# Patient Record
Sex: Female | Born: 1950
Health system: Southern US, Community
[De-identification: ages and names within clinical notes are randomized; demographics above are authoritative.]

## PROBLEM LIST (undated history)

## (undated) DIAGNOSIS — M1990 Unspecified osteoarthritis, unspecified site: Secondary | ICD-10-CM

## (undated) DIAGNOSIS — T7840XA Allergy, unspecified, initial encounter: Secondary | ICD-10-CM

## (undated) DIAGNOSIS — E785 Hyperlipidemia, unspecified: Secondary | ICD-10-CM

## (undated) DIAGNOSIS — H269 Unspecified cataract: Secondary | ICD-10-CM

## (undated) DIAGNOSIS — R519 Headache, unspecified: Secondary | ICD-10-CM

## (undated) DIAGNOSIS — M810 Age-related osteoporosis without current pathological fracture: Secondary | ICD-10-CM

## (undated) DIAGNOSIS — R51 Headache: Secondary | ICD-10-CM

## (undated) DIAGNOSIS — K219 Gastro-esophageal reflux disease without esophagitis: Secondary | ICD-10-CM

## (undated) DIAGNOSIS — E079 Disorder of thyroid, unspecified: Secondary | ICD-10-CM

## (undated) HISTORY — DX: Disorder of thyroid, unspecified: E07.9

## (undated) HISTORY — DX: Unspecified osteoarthritis, unspecified site: M19.90

## (undated) HISTORY — DX: Headache, unspecified: R51.9

## (undated) HISTORY — DX: Hyperlipidemia, unspecified: E78.5

## (undated) HISTORY — DX: Headache: R51

## (undated) HISTORY — DX: Allergy, unspecified, initial encounter: T78.40XA

## (undated) HISTORY — DX: Age-related osteoporosis without current pathological fracture: M81.0

## (undated) HISTORY — PX: TONSILLECTOMY: SUR1361

## (undated) HISTORY — DX: Gastro-esophageal reflux disease without esophagitis: K21.9

## (undated) HISTORY — DX: Unspecified cataract: H26.9

---

## 2000-09-08 ENCOUNTER — Other Ambulatory Visit: Admission: RE | Admit: 2000-09-08 | Discharge: 2000-09-08 | Payer: Self-pay | Admitting: Family Medicine

## 2001-06-10 HISTORY — PX: BREAST BIOPSY: SHX20

## 2001-08-05 ENCOUNTER — Encounter: Admission: RE | Admit: 2001-08-05 | Discharge: 2001-11-03 | Payer: Self-pay | Admitting: Family Medicine

## 2001-09-11 ENCOUNTER — Other Ambulatory Visit: Admission: RE | Admit: 2001-09-11 | Discharge: 2001-09-11 | Payer: Self-pay | Admitting: Family Medicine

## 2001-10-26 ENCOUNTER — Encounter: Payer: Self-pay | Admitting: General Surgery

## 2001-10-26 ENCOUNTER — Encounter: Admission: RE | Admit: 2001-10-26 | Discharge: 2001-10-26 | Payer: Self-pay | Admitting: General Surgery

## 2002-05-05 ENCOUNTER — Encounter: Admission: RE | Admit: 2002-05-05 | Discharge: 2002-05-05 | Payer: Self-pay | Admitting: Family Medicine

## 2002-05-05 ENCOUNTER — Encounter: Payer: Self-pay | Admitting: Family Medicine

## 2002-05-07 ENCOUNTER — Encounter (INDEPENDENT_AMBULATORY_CARE_PROVIDER_SITE_OTHER): Payer: Self-pay

## 2002-05-07 ENCOUNTER — Encounter: Admission: RE | Admit: 2002-05-07 | Discharge: 2002-05-07 | Payer: Self-pay | Admitting: General Surgery

## 2002-05-07 ENCOUNTER — Encounter: Payer: Self-pay | Admitting: General Surgery

## 2002-12-14 ENCOUNTER — Other Ambulatory Visit: Admission: RE | Admit: 2002-12-14 | Discharge: 2002-12-14 | Payer: Self-pay | Admitting: Family Medicine

## 2005-12-30 ENCOUNTER — Ambulatory Visit: Payer: Self-pay | Admitting: Family Medicine

## 2006-01-30 ENCOUNTER — Other Ambulatory Visit: Admission: RE | Admit: 2006-01-30 | Discharge: 2006-01-30 | Payer: Self-pay | Admitting: Endocrinology

## 2006-01-31 ENCOUNTER — Encounter: Admission: RE | Admit: 2006-01-31 | Discharge: 2006-01-31 | Payer: Self-pay | Admitting: *Deleted

## 2008-07-28 ENCOUNTER — Other Ambulatory Visit: Admission: RE | Admit: 2008-07-28 | Discharge: 2008-07-28 | Payer: Self-pay | Admitting: Family Medicine

## 2009-01-04 ENCOUNTER — Encounter: Admission: RE | Admit: 2009-01-04 | Discharge: 2009-01-04 | Payer: Self-pay | Admitting: Family Medicine

## 2009-11-07 ENCOUNTER — Other Ambulatory Visit: Admission: RE | Admit: 2009-11-07 | Discharge: 2009-11-07 | Payer: Self-pay | Admitting: *Deleted

## 2010-03-26 ENCOUNTER — Encounter: Admission: RE | Admit: 2010-03-26 | Discharge: 2010-03-26 | Payer: Self-pay | Admitting: Family Medicine

## 2010-04-14 ENCOUNTER — Encounter: Admission: RE | Admit: 2010-04-14 | Discharge: 2010-04-14 | Payer: Self-pay | Admitting: Family Medicine

## 2010-06-30 ENCOUNTER — Encounter: Payer: Self-pay | Admitting: Family Medicine

## 2010-07-01 ENCOUNTER — Encounter: Payer: Self-pay | Admitting: Family Medicine

## 2010-07-02 ENCOUNTER — Encounter: Payer: Self-pay | Admitting: *Deleted

## 2010-11-14 ENCOUNTER — Other Ambulatory Visit: Payer: Self-pay | Admitting: Family Medicine

## 2010-11-14 ENCOUNTER — Other Ambulatory Visit: Payer: Self-pay

## 2010-11-14 ENCOUNTER — Other Ambulatory Visit (HOSPITAL_COMMUNITY)
Admission: RE | Admit: 2010-11-14 | Discharge: 2010-11-14 | Disposition: A | Discharge status: Home / Self Care | Payer: BC Managed Care – PPO | Source: Ambulatory Visit | Attending: Internal Medicine | Admitting: Internal Medicine

## 2010-11-14 DIAGNOSIS — Z780 Asymptomatic menopausal state: Secondary | ICD-10-CM

## 2010-11-14 DIAGNOSIS — M549 Dorsalgia, unspecified: Secondary | ICD-10-CM

## 2010-11-14 DIAGNOSIS — Z01419 Encounter for gynecological examination (general) (routine) without abnormal findings: Secondary | ICD-10-CM | POA: Insufficient documentation

## 2010-11-14 DIAGNOSIS — Z1231 Encounter for screening mammogram for malignant neoplasm of breast: Secondary | ICD-10-CM

## 2010-11-29 ENCOUNTER — Other Ambulatory Visit: Payer: Self-pay | Admitting: Family Medicine

## 2010-11-29 DIAGNOSIS — N644 Mastodynia: Secondary | ICD-10-CM

## 2010-12-14 ENCOUNTER — Ambulatory Visit: Payer: Self-pay

## 2010-12-14 ENCOUNTER — Other Ambulatory Visit: Payer: Self-pay | Admitting: Family Medicine

## 2010-12-14 ENCOUNTER — Ambulatory Visit
Admission: RE | Admit: 2010-12-14 | Discharge: 2010-12-14 | Disposition: A | Discharge status: Home / Self Care | Payer: BC Managed Care – PPO | Source: Ambulatory Visit | Attending: Family Medicine | Admitting: Family Medicine

## 2010-12-14 DIAGNOSIS — Z780 Asymptomatic menopausal state: Secondary | ICD-10-CM

## 2010-12-14 DIAGNOSIS — N644 Mastodynia: Secondary | ICD-10-CM

## 2010-12-14 DIAGNOSIS — M549 Dorsalgia, unspecified: Secondary | ICD-10-CM

## 2011-11-15 ENCOUNTER — Other Ambulatory Visit: Payer: Self-pay | Admitting: Family Medicine

## 2011-11-15 ENCOUNTER — Other Ambulatory Visit (HOSPITAL_COMMUNITY)
Admission: RE | Admit: 2011-11-15 | Discharge: 2011-11-15 | Disposition: A | Discharge status: Home / Self Care | Payer: BC Managed Care – PPO | Source: Ambulatory Visit | Attending: Family Medicine | Admitting: Family Medicine

## 2011-11-15 DIAGNOSIS — N644 Mastodynia: Secondary | ICD-10-CM

## 2011-11-15 DIAGNOSIS — Z0000 Encounter for general adult medical examination without abnormal findings: Secondary | ICD-10-CM | POA: Insufficient documentation

## 2012-02-24 ENCOUNTER — Other Ambulatory Visit: Payer: Self-pay | Admitting: Family Medicine

## 2012-02-24 ENCOUNTER — Ambulatory Visit
Admission: RE | Admit: 2012-02-24 | Discharge: 2012-02-24 | Disposition: A | Discharge status: Home / Self Care | Payer: BC Managed Care – PPO | Source: Ambulatory Visit | Attending: Family Medicine | Admitting: Family Medicine

## 2012-02-24 DIAGNOSIS — N644 Mastodynia: Secondary | ICD-10-CM

## 2014-05-16 ENCOUNTER — Other Ambulatory Visit: Payer: Self-pay | Admitting: Family Medicine

## 2014-05-16 DIAGNOSIS — M8580 Other specified disorders of bone density and structure, unspecified site: Secondary | ICD-10-CM

## 2014-06-06 ENCOUNTER — Other Ambulatory Visit: Payer: Self-pay | Admitting: Family Medicine

## 2014-06-06 DIAGNOSIS — Z1231 Encounter for screening mammogram for malignant neoplasm of breast: Secondary | ICD-10-CM

## 2014-06-27 ENCOUNTER — Inpatient Hospital Stay: Admission: RE | Admit: 2014-06-27 | Payer: BC Managed Care – PPO | Source: Ambulatory Visit

## 2014-06-27 ENCOUNTER — Ambulatory Visit: Payer: BC Managed Care – PPO

## 2018-02-14 ENCOUNTER — Other Ambulatory Visit: Payer: Self-pay

## 2018-02-14 ENCOUNTER — Emergency Department (HOSPITAL_COMMUNITY): Payer: 59

## 2018-02-14 ENCOUNTER — Encounter (HOSPITAL_COMMUNITY): Payer: Self-pay | Admitting: *Deleted

## 2018-02-14 ENCOUNTER — Emergency Department (HOSPITAL_COMMUNITY)
Admission: EM | Admit: 2018-02-14 | Discharge: 2018-02-15 | Disposition: A | Discharge status: Home / Self Care | Payer: 59 | Attending: Emergency Medicine | Admitting: Emergency Medicine

## 2018-02-14 DIAGNOSIS — R079 Chest pain, unspecified: Secondary | ICD-10-CM | POA: Diagnosis present

## 2018-02-14 DIAGNOSIS — R0602 Shortness of breath: Secondary | ICD-10-CM

## 2018-02-14 DIAGNOSIS — R0789 Other chest pain: Secondary | ICD-10-CM | POA: Diagnosis not present

## 2018-02-14 LAB — CBC
HCT: 38.6 % (ref 36.0–46.0)
HEMOGLOBIN: 12.5 g/dL (ref 12.0–15.0)
MCH: 27.6 pg (ref 26.0–34.0)
MCHC: 32.4 g/dL (ref 30.0–36.0)
MCV: 85.2 fL (ref 78.0–100.0)
Platelets: 191 10*3/uL (ref 150–400)
RBC: 4.53 MIL/uL (ref 3.87–5.11)
RDW: 13.3 % (ref 11.5–15.5)
WBC: 7.9 10*3/uL (ref 4.0–10.5)

## 2018-02-14 LAB — BASIC METABOLIC PANEL
ANION GAP: 14 (ref 5–15)
BUN: 10 mg/dL (ref 8–23)
CO2: 19 mmol/L — ABNORMAL LOW (ref 22–32)
Calcium: 8.9 mg/dL (ref 8.9–10.3)
Chloride: 108 mmol/L (ref 98–111)
Creatinine, Ser: 0.94 mg/dL (ref 0.44–1.00)
Glucose, Bld: 99 mg/dL (ref 70–99)
Potassium: 3.5 mmol/L (ref 3.5–5.1)
SODIUM: 141 mmol/L (ref 135–145)

## 2018-02-14 LAB — TROPONIN I

## 2018-02-14 NOTE — ED Provider Notes (Signed)
[facility] EMERGENCY DEPARTMENT Provider Note  CSN: [id number] Arrival date & time: 02/14/18 1938  Chief Complaint(s) Chest Pain and Shortness of Breath  HPI Megan Good is a 67 y.o. female   The history is provided by the patient.  Chest Pain   This is a new problem. The current episode started 3 to 5 hours ago. The problem occurs constantly. The problem has been gradually improving. The pain is associated with movement. The pain is present in the substernal region. The pain is moderate. The quality of the pain is described as pressure-like. The pain does not radiate. Associated symptoms include dizziness, exertional chest pressure, malaise/fatigue and shortness of breath. Pertinent negatives include no cough, no fever, no irregular heartbeat, no leg pain, no lower extremity edema, no near-syncope, no numbness, no palpitations and no vomiting.  Pertinent negatives for past medical history include no CAD, no COPD, no CHF, no diabetes, no DVT, no hyperlipidemia, no hypertension, no MI, no PE, no strokes and no TIA.  Pertinent negatives for family medical history include: no early MI.  Procedure history is positive for exercise treadmill test (negative).  Procedure history is negative for cardiac catheterization.   No HRT, prior DVT/PE, recent travel or immobilization. No personal h/o cancer.  Past Medical History History reviewed. No pertinent past medical history. There are no active problems to display for this patient.  Home Medication(s) Prior to Admission medications   Not on File                                                                                                                                    Past Surgical History History reviewed. No pertinent surgical history. Family History No family history on file.  Social History Social History   Tobacco Use  . Smoking status: Never Smoker  . Smokeless tobacco: Never Used  Substance Use Topics   . Alcohol use: Never    Frequency: Never  . Drug use: Not on file   Allergies Penicillins  Review of Systems Review of Systems  Constitutional: Positive for malaise/fatigue. Negative for fever.  Respiratory: Positive for shortness of breath. Negative for cough.   Cardiovascular: Positive for chest pain. Negative for palpitations and near-syncope.  Gastrointestinal: Negative for vomiting.  Neurological: Positive for dizziness. Negative for numbness.   All other systems are reviewed and are negative for acute change except as noted in the HPI  Physical Exam Vital Signs  I have reviewed the triage vital signs BP (!) 166/63   Pulse 74   Temp 98.4 F (36.9 C) (Oral)   Resp 20   Ht 5\' 3"  (1.6 m)   Wt 68.9 kg   SpO2 100%   BMI 26.93 kg/m   Physical Exam  Constitutional: She is oriented to person, place, and time. She appears well-developed and well-nourished. No distress.  HENT:  Head: Normocephalic and atraumatic.  Nose: Nose normal.  Eyes: Pupils are equal, round, and reactive to light. Conjunctivae and EOM are normal. Right eye exhibits no discharge. Left eye exhibits no discharge. No scleral icterus.  Neck: Normal range of motion. Neck supple.  Cardiovascular: Normal rate and regular rhythm. Exam reveals no gallop and no friction rub.  No murmur heard. Pulmonary/Chest: Effort normal and breath sounds normal. No stridor. No respiratory distress. She has no rales.     She exhibits tenderness.    Abdominal: Soft. She exhibits no distension. There is no tenderness.  Musculoskeletal: She exhibits no edema or tenderness.  Neurological: She is alert and oriented to person, place, and time.  Skin: Skin is warm and dry. No rash noted. She is not diaphoretic. No erythema.  Psychiatric: She has a normal mood and affect.  Vitals reviewed.   ED Results and Treatments Labs (all labs ordered are listed, but only abnormal results are displayed) Labs Reviewed  BASIC METABOLIC  PANEL - Abnormal; Notable for the following components:      Result Value   CO2 19 (*)    All other components within normal limits  D-DIMER, QUANTITATIVE (NOT AT [facility]) - Abnormal; Notable for the following components:   D-Dimer, Quant 1.73 (*)    All other components within normal limits  CBC  TROPONIN I  I-STAT TROPONIN, ED                                                                                                                         EKG  EKG Interpretation  Date/Time:  Saturday February 14 2018 19:46:03 EDT Ventricular Rate:  80 PR Interval:  138 QRS Duration: 80 QT Interval:  370 QTC Calculation: 426 R Axis:   79 Text Interpretation:  Normal sinus rhythm Possible Left atrial enlargement Left ventricular hypertrophy Abnormal ECG U waves present No old tracing to compare Confirmed by Drema Pry [phone]) on 02/15/2018 12:06:47 AM      Radiology Dg Chest 2 View  Result Date: 02/14/2018 CLINICAL DATA:  Chest pain and shortness of breath beginning today. Lightheadedness. EXAM: CHEST - 2 VIEW COMPARISON:  None. FINDINGS: The heart size and mediastinal contours are within normal limits. Aortic atherosclerosis. Both lungs are clear. The visualized skeletal structures are unremarkable. IMPRESSION: No active cardiopulmonary disease. Electronically Signed   By: Myles Rosenthal M.D.   On: 02/14/2018 20:32   Ct Angio Chest Pe W And/or Wo Contrast  Result Date: 02/15/2018 CLINICAL DATA:  Shortness of breath this afternoon. Numbness in the left toes for 2 months. EXAM: CT ANGIOGRAPHY CHEST WITH CONTRAST TECHNIQUE: Multidetector CT imaging of the chest was performed using the standard protocol during bolus administration of intravenous contrast. Multiplanar CT image reconstructions and MIPs were obtained to evaluate the vascular anatomy. CONTRAST:  ISOVUE-370 IOPAMIDOL (ISOVUE-370) INJECTION 76% COMPARISON:  None. FINDINGS: Cardiovascular: Good opacification of the central and segmental  pulmonary arteries. No focal filling defects. No evidence of significant pulmonary embolus. Normal heart size. No pericardial effusion. Normal  caliber thoracic aorta with scattered calcifications. Mediastinum/Nodes: Esophagus is decompressed. No significant lymphadenopathy in the chest. Lungs/Pleura: Mild dependent changes in the lung bases. No airspace disease or consolidation. No pleural effusions. No pneumothorax. Airways are patent. Upper Abdomen: No acute abnormality. Musculoskeletal: No chest wall abnormality. No acute or significant osseous findings. Review of the MIP images confirms the above findings. IMPRESSION: 1. No evidence of significant pulmonary embolus. 2. No evidence of active pulmonary disease. Aortic Atherosclerosis (ICD10-I70.0). Electronically Signed   By: Burman Nieves M.D.   On: 02/15/2018 03:29   Pertinent labs & imaging results that were available during my care of the patient were reviewed by me and considered in my medical decision making (see chart for details).  Medications Ordered in ED Medications  iopamidol (ISOVUE-370) 76 % injection (has no administration in time range)  sodium chloride 0.9 % bolus 1,000 mL (1,000 mLs Intravenous New Bag/Given 02/15/18 0222)  iopamidol (ISOVUE-370) 76 % injection 100 mL (100 mLs Intravenous Contrast Given 02/15/18 0310)                                                                                                                                    Procedures Procedures  (including critical care time)  Medical Decision Making / ED Course I have reviewed the nursing notes for this encounter and the patient's prior records (if available in EHR or on provided paperwork).    Chest pain with associated shortness of breath.  EKG without acute ischemic changes or evidence of pericarditis.  Symptoms have gradually improved without intervention.  Initial troponin negative.  Heart score less than 4.  Appropriate for delta troponin which was  negative.  Chest x-ray without evidence suggestive of pneumonia, pneumothorax, pneumomediastinum.  No abnormal contour of the mediastinum to suggest dissection. No evidence of acute injuries.  Low pretest probability for pulmonary embolism but unable to [facility].  D-dimer elevated at 1.7.  CTA obtained and was negative for pulmonary embolisms.  Presentation not classic for aortic dissection or esophageal perforation.  Rest of the labs grossly reassuring without leukocytosis or anemia.  No significant electrolyte derangements or renal insufficiency.  Given the left upper chest and back pain, possible MSK etiology.  The patient appears reasonably screened and/or stabilized for discharge and I doubt any other medical condition or other [facility] requiring further screening, evaluation, or treatment in the ED at this time prior to discharge.  The patient is safe for discharge with strict return precautions.    Final Clinical Impression(s) / ED Diagnoses Final diagnoses:  Atypical chest pain  SOB (shortness of breath)    Disposition: Discharge  Condition: Good  I have discussed the results, Dx and Tx plan with the patient who expressed understanding and agree(s) with the plan. Discharge instructions discussed at great length. The patient was given strict return precautions who verbalized understanding of the instructions. No further questions at time of discharge.  ED Discharge Orders    None       Follow Up: Primary care provider   Within 30 days for stress testing. If you do not have a primary care physician, contact [facility] at [phone] for referral     This chart was dictated using voice recognition software.  Despite best efforts to proofread,  errors can occur which can change the documentation meaning.   Nira Conn, MD 02/15/18 [phone]

## 2018-02-14 NOTE — ED Triage Notes (Signed)
The pt has had sob this afternoon and she has felt faint but has not fainted  She appears anxious but denies panic attacks or anxiety  Lt toes numb for 2 months

## 2018-02-15 ENCOUNTER — Emergency Department (HOSPITAL_COMMUNITY): Payer: 59

## 2018-02-15 LAB — I-STAT TROPONIN, ED: Troponin i, poc: 0.03 ng/mL (ref 0.00–0.08)

## 2018-02-15 LAB — D-DIMER, QUANTITATIVE (NOT AT ARMC): D DIMER QUANT: 1.73 ug{FEU}/mL — AB (ref 0.00–0.50)

## 2018-02-15 MED ORDER — IOPAMIDOL (ISOVUE-370) INJECTION 76%
INTRAVENOUS | Status: AC
  Filled 2018-02-15: qty 100

## 2018-02-15 MED ORDER — SODIUM CHLORIDE 0.9 % IV BOLUS
1000.0000 mL | Freq: Once | INTRAVENOUS | Status: AC
  Administered 2018-02-15: 1000 mL via INTRAVENOUS

## 2018-02-15 MED ORDER — IOPAMIDOL (ISOVUE-370) INJECTION 76%
100.0000 mL | Freq: Once | INTRAVENOUS | Status: AC | PRN
  Administered 2018-02-15: 100 mL via INTRAVENOUS

## 2018-02-15 NOTE — ED Notes (Signed)
Patient transported to CT 

## 2018-02-15 NOTE — Discharge Instructions (Addendum)
You may use over-the-counter Motrin (Ibuprofen), Acetaminophen (Tylenol), topical muscle creams such as SalonPas, Icy Hot, Bengay, etc. Please stretch, apply heat, and have massage therapy for additional assistance. ° °

## 2018-02-20 ENCOUNTER — Ambulatory Visit: Payer: 59 | Admitting: Family Medicine

## 2018-02-20 ENCOUNTER — Encounter: Payer: Self-pay | Admitting: Family Medicine

## 2018-02-20 ENCOUNTER — Other Ambulatory Visit: Payer: 59

## 2018-02-20 VITALS — BP 128/74 | HR 86 | Temp 98.1°F | Ht 63.0 in | Wt 144.0 lb

## 2018-02-20 DIAGNOSIS — G4489 Other headache syndrome: Secondary | ICD-10-CM

## 2018-02-20 DIAGNOSIS — R0602 Shortness of breath: Secondary | ICD-10-CM | POA: Diagnosis not present

## 2018-02-20 DIAGNOSIS — Z7689 Persons encountering health services in other specified circumstances: Secondary | ICD-10-CM

## 2018-02-20 LAB — LIPID PANEL
CHOL/HDL RATIO: 4
Cholesterol: 265 mg/dL — ABNORMAL HIGH (ref 0–200)
HDL: 68.9 mg/dL (ref >39.00)
LDL CALC: 180 mg/dL — AB (ref 0–99)
NONHDL: 195.83
Triglycerides: 77 mg/dL (ref 0.0–149.0)
VLDL: 15.4 mg/dL (ref 0.0–40.0)

## 2018-02-20 NOTE — Patient Instructions (Signed)
Shortness of Breath, Adult Shortness of breath is when a person has trouble breathing enough air, or when a person feels like she or he is having trouble breathing in enough air. Shortness of breath could be a sign of medical problem. Follow these instructions at home: Pay attention to any changes in your symptoms. Take these actions to help with your condition:  Do not smoke. Smoking is a common cause of shortness of breath. If you smoke and you need help quitting, ask your health care provider.  Avoid things that can irritate your airways, such as: ? Mold. ? Dust. ? Air pollution. ? Chemical fumes. ? Things that can cause allergy symptoms (allergens), if you have allergies.  Keep your living space clean and free of mold and dust.  Rest as needed. Slowly return to your usual activities.  Take over-the-counter and prescription medicines, including oxygen and inhaled medicines, only as told by your health care provider.  Keep all follow-up visits as told by your health care provider. This is important.  Contact a health care provider if:  Your condition does not improve as soon as expected.  You have a hard time doing your normal activities, even after you rest.  You have new symptoms. Get help right away if:  Your shortness of breath gets worse.  You have shortness of breath when you are resting.  You feel light-headed or you faint.  You have a cough that is not controlled with medicines.  You cough up blood.  You have pain with breathing.  You have pain in your chest, arms, shoulders, or abdomen.  You have a fever.  You cannot walk up stairs or exercise the way that you normally do. This information is not intended to replace advice given to you by your health care provider. Make sure you discuss any questions you have with your health care provider. Document Released: 02/19/2001 Document Revised: 12/16/2015 Document Reviewed: 11/02/2015 Elsevier Interactive Patient  Education  2018 Elsevier Inc.  

## 2018-02-20 NOTE — Progress Notes (Signed)
Patient presents to clinic today to establish care.  SUBJECTIVE: PMH: /pt is a 67 yo female w/ pmh sig for bronchitis.  Pt states she was previously seen at [facility] [address] at home.  Pt states overall healthy but endorses episode of feeling tired, dizzy, SOB, "like was going to pass out" on Saturday.  Pt seen in UC then ED 9/7, CXR normal, troponin negative. EKG with possible L atrial enlargement.  D-dimer elevated at 1.7, CTA chest negative.  Pain thought 2/2 MSK cause.  Pt endorses continued SOB.  Notes HA with laying down or bending.  May happen 3-4 x per month.  Pt having loss of balance after leaving the ED.  Pt feels "woozy" when standing up.  She denies changes in vision, loss of bowel or bladder, fever, chills, n/v.  Pt having back pain. Unable to sleep on her side.  Notes numbness in toes on L foot.  Allergies: Penicillin-hives, shortness of breath "skin feels often "  Past surgical history: Tonsillectomy  Social history: Patient works as a Civil engineer, contracting.  Patient denies alcohol, tobacco, drug use.  Health Maintenance: Immunizations --influenza vaccine 2019, tetanus shot 2006 Colonoscopy --never had Mammogram --2016 PAP --2016  Family medical history: Mom-alive, arthritis Dad-Deceased, early death Sister-Bonita, alive, arthritis Daughter-alive MGM-deceased, arthritis MGF-deceased, cancer PGM-deceased F-deceased, cancer   History reviewed. No pertinent past medical history.  Past Surgical History:  Procedure Laterality Date  . TONSILLECTOMY      Current Outpatient Medications on File Prior to Visit  Medication Sig Dispense Refill  . hydrOXYzine (ATARAX/VISTARIL) 10 MG tablet Take 10 mg by mouth 3 (three) times daily as needed.     No current facility-administered medications on file prior to visit.     Allergies  Allergen Reactions  . Penicillins     History reviewed. No pertinent family history.  Social History    Socioeconomic History  . Marital status: Divorced    Spouse name: Not on file  . Number of children: Not on file  . Years of education: Not on file  . Highest education level: Not on file  Occupational History  . Not on file  Social Needs  . Financial resource strain: Not on file  . Food insecurity:    Worry: Not on file    Inability: Not on file  . Transportation needs:    Medical: Not on file    Non-medical: Not on file  Tobacco Use  . Smoking status: Never Smoker  . Smokeless tobacco: Never Used  Substance and Sexual Activity  . Alcohol use: Never    Frequency: Never  . Drug use: Not on file  . Sexual activity: Not on file  Lifestyle  . Physical activity:    Days per week: Not on file    Minutes per session: Not on file  . Stress: Not on file  Relationships  . Social connections:    Talks on phone: Not on file    Gets together: Not on file    Attends religious service: Not on file    Active member of club or organization: Not on file    Attends meetings of clubs or organizations: Not on file    Relationship status: Not on file  . Intimate partner violence:    Fear of current or ex partner: Not on file    Emotionally abused: Not on file    Physically abused: Not on file    Forced sexual activity: Not on file  Other Topics Concern  . Not on file  Social History Narrative  . Not on file    ROS General: Denies fever, chills, night sweats, changes in weight, changes in appetite  +off balance HEENT: Denies ear pain, changes in vision, rhinorrhea, sore throat  +HAs CV: Denies CP, palpitations, SOB, orthopnea Pulm: Denies SOB, cough, wheezing  +sob GI: Denies abdominal pain, nausea, vomiting, diarrhea, constipation GU: Denies dysuria, hematuria, frequency, vaginal discharge Msk: Denies muscle cramps, joint pains  +back pain Neuro: Denies weakness, numbness, tingling +numbness in toes of L foot Skin: Denies rashes, bruising Psych: Denies depression, anxiety,  hallucinations  BP 128/74 (BP Location: Left Arm, Patient Position: Sitting, Cuff Size: Normal)   Pulse 86   Temp 98.1 F (36.7 C) (Oral)   Ht '5\' 3"'  (1.6 m)   Wt 144 lb (65.3 kg)   SpO2 98%   BMI 25.51 kg/m   Physical Exam Gen. Pleasant, well developed, well-nourished, in NAD HEENT -face symmetric, [place]/AT, PERRL, no nystagmus, no scleral icterus, no nasal drainage Lungs: no use of accessory muscles, CTAB, no wheezes, rales or rhonchi Cardiovascular: RRR, No r/g/m, no peripheral edema Neuro:  A&Ox3, CN II-XII intact, normal gait.   Skin:  Warm, dry, intact, no lesions  Recent Results (from the past 2160 hour(s))  Basic metabolic panel     Status: Abnormal   Collection Time: 02/14/18  8:07 PM  Result Value Ref Range   Sodium 141 135 - 145 mmol/L   Potassium 3.5 3.5 - 5.1 mmol/L   Chloride 108 98 - 111 mmol/L   CO2 19 (L) 22 - 32 mmol/L   Glucose, Bld 99 70 - 99 mg/dL   BUN 10 8 - 23 mg/dL   Creatinine, Ser 0.94 0.44 - 1.00 mg/dL   Calcium 8.9 8.9 - 10.3 mg/dL   GFR calc non Af Amer >60 >60 mL/min   GFR calc Af Amer >60 >60 mL/min    Comment: (NOTE) The eGFR has been calculated using the CKD EPI equation. This calculation has not been validated in all clinical situations. eGFR's persistently <60 mL/min signify possible Chronic Kidney Disease.    Anion gap 14 5 - 15    Comment: Performed at [facility] [address], [place] [address]  CBC     Status: None   Collection Time: 02/14/18  8:07 PM  Result Value Ref Range   WBC 7.9 4.0 - 10.5 K/uL   RBC 4.53 3.87 - 5.11 MIL/uL   Hemoglobin 12.5 12.0 - 15.0 g/dL   HCT 38.6 36.0 - 46.0 %   MCV 85.2 78.0 - 100.0 fL   MCH 27.6 26.0 - 34.0 pg   MCHC 32.4 30.0 - 36.0 g/dL   RDW 13.3 11.5 - 15.5 %   Platelets 191 150 - 400 K/uL    Comment: Performed at [facility] [address], [place], [place] [address]  Troponin I     Status: None   Collection Time: 02/14/18  8:07 PM  Result Value Ref Range    Troponin I <0.03 <0.03 ng/mL    Comment: Performed at [facility] [address] [place], [place] [address]  D-dimer, quantitative (not at [facility])     Status: Abnormal   Collection Time: 02/14/18 11:34 PM  Result Value Ref Range   D-Dimer, Quant 1.73 (H) 0.00 - 0.50 ug/mL-FEU    Comment: (NOTE) At the manufacturer cut-off of 0.50 ug/mL FEU, this assay has been documented  to exclude PE with a sensitivity and negative predictive value of 97 to 99%.  At this time, this assay has not been approved by the FDA to exclude DVT/VTE. Results should be correlated with clinical presentation. Performed at [facility], [place] [address] [place], [place] [address]   I-Stat Troponin, ED (not at [facility])     Status: None   Collection Time: 02/15/18 12:38 AM  Result Value Ref Range   Troponin i, poc 0.03 0.00 - 0.08 ng/mL   Comment 3            Comment: Due to the release kinetics of cTnI, a negative result within the first hours of the onset of symptoms does not rule out myocardial infarction with certainty. If myocardial infarction is still suspected, repeat the test at appropriate intervals.     Assessment/Plan: SOB (shortness of breath)  -Pt reassured labs in ED negative.   -O2 sats 98% on RA this visit. -EKG reviewed from UC and the ED---possible L atrial enlargement. - Plan: Ambulatory referral to Cardiology  Other headache syndrome  -given new onset HAs with change in position will order CT Head - Plan: CT Head Wo Contrast, Lipid panel, Ambulatory referral to Neurology, Lipid panel  Encounter to establish care -We reviewed the PMH, PSH, FH, SH, Meds and Allergies. -We provided refills for any medications we will prescribe as needed. -We addressed current concerns per orders and patient instructions. -We have asked for records for pertinent exams, studies, vaccines and notes from previous providers. -We have advised patient to follow up per instructions below.  F/u prn in  the next few wks for other chronic concerns that were not addressed this visit 2/2 time.  Grier Mitts, MD

## 2018-02-23 ENCOUNTER — Ambulatory Visit: Payer: Self-pay | Admitting: *Deleted

## 2018-02-23 NOTE — Telephone Encounter (Signed)
Pt seen for OV with Dr. Salomon FickBanks on 9/13 with complaints of SOB. Pt states she is currently at work and notices that with moving around and sitting in office chair she is still experiencing SOB and feels uncomfortable. Pt sounds short of breath while speaking with triage nurse and is speaking in phrases.Pt states she feels like she has to drink something like Ginger ale in order to burp and clear her "passageway".Pt states that last week she was at home when she initially felt SOB and she was able to lie down with improvement of symptoms. Pt also mentions that her left arm hurts, below the elbow and pt thinks it is related to a muscle. Pt advised to seek care in the ED for current symptoms. Pt states "she will see how it works out". Pt states she will go home to lie down first to see if symptoms improve because she really does not want to go back to the ED. Pt states a CT scan and cardiology referral was ordered regarding shortness of breath during visit last week. Pt states she may go to the ED if she does not feel better after going home.  Reason for Disposition . [1] MODERATE difficulty breathing (e.g., speaks in phrases, SOB even at rest, pulse 100-120) AND [2] NEW-onset or WORSE than normal  Answer Assessment - Initial Assessment Questions 1. RESPIRATORY STATUS: "Describe your breathing?" (e.g., wheezing, shortness of breath, unable to speak, severe coughing)      Shortness of breath, speaking in phrases with triage nurse on the phone 2. ONSET: "When did this breathing problem begin?"     Pt states it started last week 3. PATTERN "Does the difficult breathing come and go, or has it been constant since it started?"      Comes and goes but gets worse with activity 4. SEVERITY: "How bad is your breathing?" (e.g., mild, moderate, severe)    - MILD: No SOB at rest, mild SOB with walking, speaks normally in sentences, can lay down, no retractions, pulse < 100.    - MODERATE: SOB at rest, SOB with minimal  exertion and prefers to sit, cannot lie down flat, speaks in phrases, mild retractions, audible wheezing, pulse 100-120.    - SEVERE: Very SOB at rest, speaks in single words, struggling to breathe, sitting hunched forward, retractions, pulse > 120      Mild to moderate, pt states she feels better with lying down but is currently speaking in phrases with triage nurse 5. RECURRENT SYMPTOM: "Have you had difficulty breathing before?" If so, ask: "When was the last time?" and "What happened that time?"      Yes for the last week off and on 6. CARDIAC HISTORY: "Do you have any history of heart disease?" (e.g., heart attack, angina, bypass surgery, angioplasty)      no 7. LUNG HISTORY: "Do you have any history of lung disease?"  (e.g., pulmonary embolus, asthma, emphysema)     No 8. CAUSE: "What do you think is causing the breathing problem?"      unsure 9. OTHER SYMPTOMS: "Do you have any other symptoms? (e.g., dizziness, runny nose, cough, chest pain, fever)     No 10. PREGNANCY: "Is there any chance you are pregnant?" "When was your last menstrual period?"       n/a 11. TRAVEL: "Have you traveled out of the country in the last month?" (e.g., travel history, exposures)       N/a  Protocols used: BREATHING DIFFICULTY-A-AH

## 2018-02-23 NOTE — Telephone Encounter (Signed)
LMTCB to check status of pt

## 2018-02-24 ENCOUNTER — Encounter: Payer: Self-pay | Admitting: Neurology

## 2018-02-24 NOTE — Telephone Encounter (Signed)
LMTCB to check status

## 2018-02-24 NOTE — Telephone Encounter (Signed)
Spoke with pt and she states she is doing better. She reports ShOB has improved. Offered appt to assess but pt declines at this time. She said she will call back in a few days if not improving. Advised pt nurse triage available 24/7 if she needs advice. Nothing further needed at this time.

## 2018-02-25 ENCOUNTER — Encounter: Payer: Self-pay | Admitting: Family Medicine

## 2018-02-25 ENCOUNTER — Emergency Department (HOSPITAL_COMMUNITY)
Admission: EM | Admit: 2018-02-25 | Discharge: 2018-02-25 | Disposition: A | Discharge status: Home / Self Care | Payer: 59 | Attending: Emergency Medicine | Admitting: Emergency Medicine

## 2018-02-25 ENCOUNTER — Emergency Department (HOSPITAL_COMMUNITY): Payer: 59

## 2018-02-25 DIAGNOSIS — R0602 Shortness of breath: Secondary | ICD-10-CM | POA: Diagnosis not present

## 2018-02-25 DIAGNOSIS — R200 Anesthesia of skin: Secondary | ICD-10-CM | POA: Diagnosis not present

## 2018-02-25 LAB — URINALYSIS, ROUTINE W REFLEX MICROSCOPIC
BILIRUBIN URINE: NEGATIVE
GLUCOSE, UA: NEGATIVE mg/dL
Hgb urine dipstick: NEGATIVE
KETONES UR: 5 mg/dL — AB
Leukocytes, UA: NEGATIVE
NITRITE: NEGATIVE
PH: 8 (ref 5.0–8.0)
Protein, ur: NEGATIVE mg/dL
SPECIFIC GRAVITY, URINE: 1.001 — AB (ref 1.005–1.030)

## 2018-02-25 LAB — BASIC METABOLIC PANEL
Anion gap: 17 — ABNORMAL HIGH (ref 5–15)
BUN: 8 mg/dL (ref 8–23)
CALCIUM: 9.8 mg/dL (ref 8.9–10.3)
CHLORIDE: 105 mmol/L (ref 98–111)
CO2: 22 mmol/L (ref 22–32)
Creatinine, Ser: 0.92 mg/dL (ref 0.44–1.00)
GFR calc non Af Amer: >60 mL/min (ref >60)
Glucose, Bld: 100 mg/dL — ABNORMAL HIGH (ref 70–99)
Potassium: 3.2 mmol/L — ABNORMAL LOW (ref 3.5–5.1)
Sodium: 144 mmol/L (ref 135–145)

## 2018-02-25 LAB — CBC
HCT: 38.7 % (ref 36.0–46.0)
Hemoglobin: 13.1 g/dL (ref 12.0–15.0)
MCH: 28.2 pg (ref 26.0–34.0)
MCHC: 33.9 g/dL (ref 30.0–36.0)
MCV: 83.4 fL (ref 78.0–100.0)
Platelets: 166 10*3/uL (ref 150–400)
RBC: 4.64 MIL/uL (ref 3.87–5.11)
RDW: 12.7 % (ref 11.5–15.5)
WBC: 6.2 10*3/uL (ref 4.0–10.5)

## 2018-02-25 LAB — I-STAT TROPONIN, ED: Troponin i, poc: 0 ng/mL (ref 0.00–0.08)

## 2018-02-25 NOTE — ED Provider Notes (Signed)
[facility] Provider Note   CSN: [id number] Arrival date & time: 02/25/18  [id number]     History   Chief Complaint Chief Complaint  Patient presents with  . Shortness of Breath    HPI Megan Good is a 67 y.o. female.  Patient brought in by [facility].  She was brought in from work for shortness of breath.  Patient was also seen September 7 for very similar complaint.  No chest pain associated with it.  Has had some numbness to the left foot for about 3 weeks.  Has a head CT arranged to evaluate this on Friday which was arranged by her primary care provider.  Her main concern is the this persistent shortness of breath.  On September 7 ED visit patient had CT Angie of chest without any acute findings.  Since her work-up was negative.  Patient states that she is able to sleep okay at night but when she first gets up and about she gets short of breath.  The numbness to the foot is just on the top of the right foot.  Kind of distal forefoot and top of the toes no numbness to the bottom of the foot.  No history of back pain.     No past medical history on file.  There are no active problems to display for this patient.   Past Surgical History:  Procedure Laterality Date  . TONSILLECTOMY       OB History   None      Home Medications    Prior to Admission medications   Medication Sig Start Date End Date Taking? Authorizing Provider  ibuprofen (ADVIL,MOTRIN) 200 MG tablet Take 200 mg by mouth every 6 (six) hours as needed for mild pain.   Yes [provider]    Family History No family history on file.  Social History Social History   Tobacco Use  . Smoking status: Never Smoker  . Smokeless tobacco: Never Used  Substance Use Topics  . Alcohol use: Never    Frequency: Never  . Drug use: Not on file     Allergies   Penicillins   Review of Systems Review of Systems  Constitutional: Negative for fever.    HENT: Negative for congestion.   Eyes: Negative for visual disturbance.  Respiratory: Positive for shortness of breath. Negative for wheezing.   Cardiovascular: Negative for chest pain and leg swelling.  Gastrointestinal: Negative for abdominal pain.  Genitourinary: Positive for dysuria.  Musculoskeletal: Negative for myalgias.  Skin: Negative for rash.  Neurological: Positive for numbness. Negative for syncope.  Hematological: Does not bruise/bleed easily.  Psychiatric/Behavioral: Negative for confusion.     Physical Exam Updated Vital Signs BP (!) 163/106 (BP Location: Right Arm)   Pulse 87   Temp 98.6 F (37 C) (Oral)   Resp 18   SpO2 100%   Physical Exam  Constitutional: She is oriented to person, place, and time. She appears well-developed and well-nourished. No distress.  HENT:  Head: Normocephalic and atraumatic.  Mouth/Throat: Oropharynx is clear and moist.  Eyes: Pupils are equal, round, and reactive to light. Conjunctivae and EOM are normal.  Neck: Neck supple.  Cardiovascular: Normal rate and regular rhythm.  Pulmonary/Chest: Effort normal and breath sounds normal. No respiratory distress. She has no wheezes.  Abdominal: Soft. Bowel sounds are normal. There is no tenderness.  Neurological: She is alert and oriented to person, place, and time. No cranial nerve deficit or sensory deficit.  She exhibits normal muscle tone. Coordination normal.  Skin: Skin is warm. No rash noted.  Nursing note and vitals reviewed.    ED Treatments / Results  Labs (all labs ordered are listed, but only abnormal results are displayed) Labs Reviewed  BASIC METABOLIC PANEL - Abnormal; Notable for the following components:      Result Value   Potassium 3.2 (*)    Glucose, Bld 100 (*)    Anion gap 17 (*)    All other components within normal limits  URINALYSIS, ROUTINE W REFLEX MICROSCOPIC - Abnormal; Notable for the following components:   Color, Urine COLORLESS (*)    Specific  Gravity, Urine 1.001 (*)    Ketones, ur 5 (*)    All other components within normal limits  CBC  I-STAT TROPONIN, ED    EKG EKG Interpretation  Date/Time:  Wednesday February 25 2018 10:13:49 EDT Ventricular Rate:  82 PR Interval:    QRS Duration: 89 QT Interval:  377 QTC Calculation: 441 R Axis:   80 Text Interpretation:  Sinus rhythm Consider left ventricular hypertrophy Nonspecific T abnormalities, inferior leads U waves present No significant change since last tracing Confirmed by Vanetta Mulders [id number]) on 02/25/2018 11:47:10 AM   Radiology Dg Chest 2 View  Result Date: 02/25/2018 CLINICAL DATA:  Chest pain and shortness of breath. EXAM: CHEST - 2 VIEW COMPARISON:  Chest radiographs 02/14/2018 and CTA 02/15/2018 FINDINGS: The cardiomediastinal silhouette is unchanged and within normal limits. Aortic atherosclerosis is noted. No airspace consolidation, edema, pleural effusion, pneumothorax is identified. No acute osseous abnormality is seen. IMPRESSION: No active cardiopulmonary disease. Electronically Signed   By: Sebastian Ache M.D.   On: 02/25/2018 14:07    Procedures Procedures (including critical care time)  Medications Ordered in ED Medications - No data to display   Initial Impression / Assessment and Plan / ED Course  I have reviewed the triage vital signs and the nursing notes.  Pertinent labs & imaging results that were available during my care of the patient were reviewed by me and considered in my medical decision making (see chart for details).    Work-up here and review of the work-up on September 7 without any acute findings.  Troponin again negative.  Chest x-ray without any acute findings.  Patient's room air saturations are in the upper 90s no respiratory distress no wheezing.  The numbness to the foot is very isolated just to the top of the right foot and the toes.  Not on the bottom of the foot unlikely to represent a stroke.  But her primary care  provider is arranged to head CT which will be done on Friday.  She also has follow-up with cardiology on Monday.  One could consider also arranging follow-up with pulmonary medicine have suggested that in her discharge.  Not able to explain the shortness of breath patient nontoxic no acute distress.  No other focal neuro deficits other than the numbness to the one foot area.  Patient stable for discharge home follow back up with her primary care provider she will get her head CT on Friday and see cardiology on Monday.  Work note provided.   Final Clinical Impressions(s) / ED Diagnoses   Final diagnoses:  SOB (shortness of breath)  Numbness    ED Discharge Orders    None       Vanetta Mulders, MD 02/25/18 [phone]

## 2018-02-25 NOTE — Discharge Instructions (Addendum)
Return for any new or worse symptoms.  Keep your appointment for a year head CT on Friday.  Your appointment for cardiology on Monday.  Would recommend that it may be reasonable to also follow-up with pulmonary medicine.  For pulmonary function test.  This could be arranged by your primary care doctor.  Today's work-up without any acute findings.

## 2018-02-25 NOTE — ED Triage Notes (Signed)
BIB EMS for onset of dyspnea while sitting at work this am - no associated pain; however did rgt "tingling" to left foot; same episode x2 wks ago - seen in ED without definitive dx; scheduled for OP head CT for further eval of pain to top of head; also urinary frequency

## 2018-02-27 ENCOUNTER — Ambulatory Visit: Payer: Self-pay

## 2018-02-27 ENCOUNTER — Ambulatory Visit (INDEPENDENT_AMBULATORY_CARE_PROVIDER_SITE_OTHER)
Admission: RE | Admit: 2018-02-27 | Discharge: 2018-02-27 | Disposition: A | Discharge status: Home / Self Care | Payer: 59 | Source: Ambulatory Visit | Attending: Family Medicine | Admitting: Family Medicine

## 2018-02-27 ENCOUNTER — Telehealth: Payer: Self-pay | Admitting: Family Medicine

## 2018-02-27 DIAGNOSIS — G4489 Other headache syndrome: Secondary | ICD-10-CM | POA: Diagnosis not present

## 2018-02-27 NOTE — Telephone Encounter (Signed)
Copied from CRM [id number]. Topic: Inquiry >> Feb 27, 2018  4:20 PM Baldo DaubAlexander, Amber L wrote: Reason for CRM:   Pt calling to find out if results are back from CT scan.   Pt can be reached at [phone].

## 2018-02-27 NOTE — Progress Notes (Addendum)
NEUROLOGY CONSULTATION NOTE  Megan Good MRN: [id number] DOB: October 05, 1950  Referring provider: Abbe Amsterdam, MD Primary care provider: Abbe Amsterdam, MD  Reason for consult:  headache  HISTORY OF PRESENT ILLNESS: Megan Good is a 67 year old right-handed female who presents for headaches.  History supplemented by referring provider's note.  Onset:  July.  She denies prior history of headache.  Around the same time, she began feeling tired, dizzy and lightheaded, like she is going to pass out.  She reports back pain as well.  She was seen in the ED for atypical chest pain.  PE and cardiac etiology ruled out.  She was later seen in the ED for shortness of breath with negative workup.   Location:  Left temple or posterior parietal region.  She also has left sided neck and shoulder pain Quality:  Electric shock Intensity:  5-6/10.  She denies new headache, thunderclap headache or severe headache that wakes her from sleep. Aura:  no Prodrome:  no Postdrome:  no Associated symptoms:  None.  She denies associated nausea, vomiting, photophobia, phonophobia, autonomic symptoms, visual disturbance or unilateral numbness or weakness. Duration:  Few seconds at a time Frequency:  Varies, couple of times a week  Frequency of abortive medication: none Exacerbating factors:  Laying on that side, emotional stress, reading with head down Relieving factors:  Laying head on the opposite. Activity:  Does not aggravate.  Of note, she has history of back pain.  She reports numbness in the toes of her left foot.  No radicular pain or weakness of the leg.  CT Head without contrast from 02/27/18 was personally reviewed and is normal. 02/25/18 LABS:  CBC with WBC 6.2, HGB 13.1, HCT 38.7, PLT 166; BMP with Na 144, K 3.2, Cl 105, CO2 22, glucose 100, BUN 8, Cr 0.92.  Current NSAIDS:  Ibuprofen (for back) Current analgesics:  no Current triptans:  no Current ergotamine:  no Current  anti-emetic:  no Current muscle relaxants:  no Current anti-anxiolytic:  no Current sleep aide:  no Current Antihypertensive medications:  no Current Antidepressant medications:  no Current Anticonvulsant medications:  no Current anti-CGRP:  no Current Vitamins/Herbal/Supplements:  no Current Antihistamines/Decongestants:  no Other therapy:  no Other medication:  no  Past NSAIDS:  no Past analgesics:  no Past abortive triptans:  no Past abortive ergotamine:  no Past muscle relaxants:  no Past anti-emetic:  no Past antihypertensive medications:  no Past antidepressant medications:  no Past anticonvulsant medications:  no Past anti-CGRP:  on Past vitamins/Herbal/Supplements:  no Past antihistamines/decongestants:  no Other past therapies:  No  Depression:  no; Anxiety:  no Other pain:  Back pain  PAST MEDICAL HISTORY: No past medical history on file.  PAST SURGICAL HISTORY: Past Surgical History:  Procedure Laterality Date  . TONSILLECTOMY      MEDICATIONS: Current Outpatient Medications on File Prior to Visit  Medication Sig Dispense Refill  . ibuprofen (ADVIL,MOTRIN) 200 MG tablet Take 200 mg by mouth every 6 (six) hours as needed for mild pain.     No current facility-administered medications on file prior to visit.     ALLERGIES: Allergies  Allergen Reactions  . Penicillins Swelling and Rash    Has patient had a PCN reaction causing immediate rash, facial/tongue/throat swelling, SOB or lightheadedness with hypotension: No Has patient had a PCN reaction causing severe rash involving mucus membranes or skin necrosis: No Has patient had a PCN reaction that required hospitalization: No Has  patient had a PCN reaction occurring within the last 10 years: No If all of the above answers are "NO", then may proceed with Cephalosporin use.  Burn like rash    FAMILY HISTORY: No family history on file.  SOCIAL HISTORY: Social History   Socioeconomic History  .  Marital status: Single    Spouse name: Not on file  . Number of children: Not on file  . Years of education: Not on file  . Highest education level: Not on file  Occupational History  . Not on file  Social Needs  . Financial resource strain: Not on file  . Food insecurity:    Worry: Not on file    Inability: Not on file  . Transportation needs:    Medical: Not on file    Non-medical: Not on file  Tobacco Use  . Smoking status: Never Smoker  . Smokeless tobacco: Never Used  Substance and Sexual Activity  . Alcohol use: Never    Frequency: Never  . Drug use: Not on file  . Sexual activity: Not on file  Lifestyle  . Physical activity:    Days per week: Not on file    Minutes per session: Not on file  . Stress: Not on file  Relationships  . Social connections:    Talks on phone: Not on file    Gets together: Not on file    Attends religious service: Not on file    Active member of club or organization: Not on file    Attends meetings of clubs or organizations: Not on file    Relationship status: Not on file  . Intimate partner violence:    Fear of current or ex partner: Not on file    Emotionally abused: Not on file    Physically abused: Not on file    Forced sexual activity: Not on file  Other Topics Concern  . Not on file  Social History Narrative  . Not on file    REVIEW OF SYSTEMS: Constitutional: No fevers, chills, or sweats, no generalized fatigue, change in appetite Eyes: No visual changes, double vision, eye pain Ear, nose and throat: No hearing loss, ear pain, nasal congestion, sore throat Cardiovascular: No chest pain, palpitations Respiratory:  No shortness of breath at rest or with exertion, wheezes GastrointestinaI: No nausea, vomiting, diarrhea, abdominal pain, fecal incontinence Genitourinary:  No dysuria, urinary retention or frequency Musculoskeletal:  Neck pain, back pain Integumentary: No rash, pruritus, skin lesions Neurological: as  above Psychiatric: No depression, insomnia, anxiety Endocrine: No palpitations, fatigue, diaphoresis, mood swings, change in appetite, change in weight, increased thirst Hematologic/Lymphatic:  No purpura, petechiae. Allergic/Immunologic: no itchy/runny eyes, nasal congestion, recent allergic reactions, rashes  PHYSICAL EXAM: Blood pressure 118/60, pulse 86, height 5\' 3"  (1.6 m), weight 138 lb (62.6 kg), SpO2 99 %. General: No acute distress.  Patient appears well-groomed.  Head:  Normocephalic/atraumatic Eyes:  fundi examined but not visualized Neck: supple, left suboccipital and paraspinal tenderness, full range of motion Back: No paraspinal tenderness Heart: regular rate and rhythm Lungs: Clear to auscultation bilaterally. Vascular: No carotid bruits. Neurological Exam: Mental status: alert and oriented to person, place, and time, recent and remote memory intact, fund of knowledge intact, attention and concentration intact, speech fluent and not dysarthric, language intact. Cranial nerves: CN I: not tested CN II: pupils equal, round and reactive to light, visual fields intact CN III, IV, VI:  full range of motion, no nystagmus, no ptosis CN V: decreased left  V1-V3 CN VII: upper and lower face symmetric CN VIII: hearing intact CN IX, X: gag intact, uvula midline CN XI: sternocleidomastoid and trapezius muscles intact CN XII: tongue midline Bulk & Tone: normal, no fasciculations. Motor:  5/5 throughout  Sensation: pinprick sensation reduced in the toes and distal half of dorsum of left foot; and vibration sensation intact. Deep Tendon Reflexes:  2+ throughout, toes downgoing.  Finger to nose testing:  Without dysmetria.  Heel to shin:  Without dysmetria.  Gait:  Normal station and stride.  Able to turn and tandem walk. Romberg negative.  IMPRESSION: 1.  New onset headache in woman over 50.  May be cervicogenic, may be primary stabbing headache. 2.  Left facial numbness, unclear  etiology 3.  Left foot numbness, possibly radicular or peroneal neuropathy  PLAN: 1.  Headache brief and infrequent, so no medication will be started at this time 2.  Check MRI of brain and cervical spine 3.  Check sed rate 4.  NCV-EMG of left lower extremity 5.  Follow up after testing.  Further recommendations pending results.  Thank you for allowing me to take part in the care of this patient.  Shon Millet, DO  CC: Abbe Amsterdam, MD

## 2018-02-27 NOTE — Telephone Encounter (Signed)
Patient called in with c/o "shortness of breath." She says "I have been having SOB since 02/14/18 and it's still there. I've gone to the ED, in to see Dr. Salomon FickBanks and back to the ED, still with no explanation as to why I am still short of breath. It's bad at night and when I'm up moving around, even getting dressed. I am supposed to go back to work next Tuesday, but don't know if I will be able to. Sometimes if I talk a lot, I get SOB." I asked about other symptoms, she says "I was dizzy Wednesday at work, but it passed. No other symptoms." According to protocol, see PCP within 3 days, appointment scheduled for Wednesday, 03/04/18 at 1430 with Dr. Salomon FickBanks. I asked the patient if she wanted to go to the [facility] or see another provider on Monday, she says she will keep the appointment with Dr. Salomon FickBanks, since she saw her for SOB 2 weeks ago. Care advice given, patient verbalized understanding.  Reason for Disposition . [1] MODERATE longstanding difficulty breathing (e.g., speaks in phrases, SOB even at rest, pulse 100-120) AND [2] SAME as normal  Answer Assessment - Initial Assessment Questions 1. RESPIRATORY STATUS: "Describe your breathing?" (e.g., wheezing, shortness of breath, unable to speak, severe coughing)      Shortness of breath 2. ONSET: "When did this breathing problem begin?"      02/14/18  3. PATTERN "Does the difficult breathing come and go, or has it been constant since it started?"      Constant since it started 4. SEVERITY: "How bad is your breathing?" (e.g., mild, moderate, severe)    - MILD: No SOB at rest, mild SOB with walking, speaks normally in sentences, can lay down, no retractions, pulse < 100.    - MODERATE: SOB at rest, SOB with minimal exertion and prefers to sit, cannot lie down flat, speaks in phrases, mild retractions, audible wheezing, pulse 100-120.    - SEVERE: Very SOB at rest, speaks in single words, struggling to breathe, sitting hunched forward, retractions,  pulse > 120      Moderate 5. RECURRENT SYMPTOM: "Have you had difficulty breathing before?" If so, ask: "When was the last time?" and "What happened that time?"      No 6. CARDIAC HISTORY: "Do you have any history of heart disease?" (e.g., heart attack, angina, bypass surgery, angioplasty)      No 7. LUNG HISTORY: "Do you have any history of lung disease?"  (e.g., pulmonary embolus, asthma, emphysema)     Bronchitis in 20's-30's x 3 times 8. CAUSE: "What do you think is causing the breathing problem?"      I don't know 9. OTHER SYMPTOMS: "Do you have any other symptoms? (e.g., dizziness, runny nose, cough, chest pain, fever)     Dizziness earlier in week 10. PREGNANCY: "Is there any chance you are pregnant?" "When was your last menstrual period?"       No 11. TRAVEL: "Have you traveled out of the country in the last month?" (e.g., travel history, exposures)      No  Protocols used: BREATHING DIFFICULTY-A-AH

## 2018-03-02 ENCOUNTER — Encounter: Payer: Self-pay | Admitting: Neurology

## 2018-03-02 ENCOUNTER — Ambulatory Visit: Payer: 59 | Admitting: Neurology

## 2018-03-02 VITALS — BP 118/60 | HR 86 | Ht 63.0 in | Wt 138.0 lb

## 2018-03-02 DIAGNOSIS — R200 Anesthesia of skin: Secondary | ICD-10-CM | POA: Diagnosis not present

## 2018-03-02 DIAGNOSIS — R208 Other disturbances of skin sensation: Secondary | ICD-10-CM

## 2018-03-02 DIAGNOSIS — R51 Headache: Secondary | ICD-10-CM

## 2018-03-02 DIAGNOSIS — R519 Headache, unspecified: Secondary | ICD-10-CM

## 2018-03-02 LAB — SEDIMENTATION RATE: Sed Rate: 2 mm/h (ref 0–30)

## 2018-03-02 NOTE — Patient Instructions (Addendum)
1.  We will check MRI of brain and cervical spine. We have sent a referral to [facility] for your MRI and they will call you directly to schedule your appt. They are located at [address] If you need to contact them directly please call [phone].  2. Your provider has requested that you have labwork completed today. Please go to [facility]. Map provided.   3.  We will check nerve study of the left lower extremity  4.  Follow up after testing.  Further recommendations pending results.

## 2018-03-03 ENCOUNTER — Telehealth: Payer: Self-pay

## 2018-03-03 NOTE — Telephone Encounter (Signed)
Called and LMOVM advising Pt of lab results and to call with any questions.

## 2018-03-03 NOTE — Telephone Encounter (Signed)
-----   Message from Drema DallasAdam R Jaffe, DO sent at 03/03/2018  7:26 AM EDT ----- Sed rate (a test for inflammatory conditions) is normal

## 2018-03-04 ENCOUNTER — Encounter: Payer: Self-pay | Admitting: Family Medicine

## 2018-03-04 ENCOUNTER — Ambulatory Visit (INDEPENDENT_AMBULATORY_CARE_PROVIDER_SITE_OTHER): Payer: 59 | Admitting: Family Medicine

## 2018-03-04 VITALS — BP 118/68 | HR 66 | Temp 97.9°F | Wt 140.0 lb

## 2018-03-04 DIAGNOSIS — R0602 Shortness of breath: Secondary | ICD-10-CM | POA: Diagnosis not present

## 2018-03-04 MED ORDER — PANTOPRAZOLE SODIUM 20 MG PO TBEC: 20.0000 mg | DELAYED_RELEASE_TABLET | Freq: Every day | ORAL | 1 refills | Status: DC

## 2018-03-04 NOTE — Progress Notes (Signed)
Subjective:    Patient ID: Megan Good, female    DOB: 31-Mar-1951, 67 y.o.   MRN: [id number]  No chief complaint on file.   HPI Patient was seen today for f/u on SOB.  Pt with continued, but slightly improved SOB. Pt seen in ED on 9/18 for continued symptoms. CXR and EKG were negative.  O2 sats normal.  Pt notes a tightness in her chest and mild soreness underneath L rib cage.  Pt notes SOB sensation when moving about/increasing activity.  Pt notes decreased appetite as she feels food gets stuck in the middle of her chest.  Pt endorses increased mucus in throat.  She deneis h/o reflux or anxiety.  Pt did have appt with neurology.  MRI and EMG planned for pt's L sided numbness and daily HAs.  CT head was negative.    Past Medical History:  Diagnosis Date  . Headache     Allergies  Allergen Reactions  . Penicillins Swelling and Rash    Has patient had a PCN reaction causing immediate rash, facial/tongue/throat swelling, SOB or lightheadedness with hypotension: No Has patient had a PCN reaction causing severe rash involving mucus membranes or skin necrosis: No Has patient had a PCN reaction that required hospitalization: No Has patient had a PCN reaction occurring within the last 10 years: No If all of the above answers are "NO", then may proceed with Cephalosporin use.  Burn like rash    ROS General: Denies fever, chills, night sweats, changes in weight, changes in appetite HEENT: Denies headaches, ear pain, changes in vision, rhinorrhea, sore throat  +increased mucus in throat CV: Denies CP, palpitations, orthopnea  +SOB Pulm: Denies wheezing  +SOB, cough GI: Denies abdominal pain, nausea, vomiting, diarrhea, constipation GU: Denies dysuria, hematuria, frequency, vaginal discharge Msk: Denies muscle cramps, joint pains Neuro: Denies weakness, numbness, tingling Skin: Denies rashes, bruising Psych: Denies depression, anxiety, hallucinations     Objective:    Blood  pressure 118/68, pulse 66, temperature 97.9 F (36.6 C), temperature source Oral, weight 140 lb (63.5 kg), SpO2 98 %.   Gen. Pleasant, well-nourished, in no distress, normal affect   HEENT: [place]/AT, face symmetric, no scleral icterus, PERRLA, nares patent without drainage, pharynx without erythema or exudate. Lungs: no accessory muscle use, CTAB, no wheezes or rales Cardiovascular: RRR, no m/r/g, no peripheral edema Abdomen: BS present, soft, ND.  Mild TTP of L upper quadrant inferior to rib cage. Neuro:  A&Ox3, CN II-XII intact, normal gait   Wt Readings from Last 3 Encounters:  03/04/18 140 lb (63.5 kg)  03/02/18 138 lb (62.6 kg)  02/20/18 144 lb (65.3 kg)    Lab Results  Component Value Date   WBC 6.2 02/25/2018   HGB 13.1 02/25/2018   HCT 38.7 02/25/2018   PLT 166 02/25/2018   GLUCOSE 100 (H) 02/25/2018   CHOL 265 (H) 02/20/2018   TRIG 77.0 02/20/2018   HDL 68.90 02/20/2018   LDLCALC 180 (H) 02/20/2018   NA 144 02/25/2018   K 3.2 (L) 02/25/2018   CL 105 02/25/2018   CREATININE 0.92 02/25/2018   BUN 8 02/25/2018   CO2 22 02/25/2018    Assessment/Plan:  SOB (shortness of breath)  -pt reassured as cardiac work-up has been negative. -Discussed various causes of this SOB including anxiety, pulmonary versus cardiac in nature, atypical GERD -We will start Protonix to see if patient's symptoms improve as increased mucus, decreased appetite, and cough may be 2/2 GERD. -If symptoms continue will refer  patient to pulmonology - Plan: pantoprazole (PROTONIX) 20 MG tablet  Follow-up PRN in the next 2 to 4 weeks  Abbe Amsterdam, MD

## 2018-03-05 ENCOUNTER — Ambulatory Visit: Payer: 59 | Admitting: Family Medicine

## 2018-03-05 NOTE — Telephone Encounter (Signed)
Called pt left a detailed message regarding her CT results.

## 2018-03-09 NOTE — Telephone Encounter (Signed)
Let a detailed message of pt CT results

## 2018-03-19 NOTE — Telephone Encounter (Signed)
Pt Short Term Disability was faxed to [facility] on 03/17/2018

## 2018-03-24 ENCOUNTER — Encounter: Payer: 59 | Admitting: Neurology

## 2018-03-26 ENCOUNTER — Other Ambulatory Visit: Payer: 59

## 2018-03-31 ENCOUNTER — Telehealth: Payer: Self-pay

## 2018-03-31 NOTE — Telephone Encounter (Signed)
Pt medical records have been faxed to [facility] per Dr Salomon Fick as requested by the benefits center.

## 2018-04-13 ENCOUNTER — Other Ambulatory Visit: Payer: 59

## 2018-04-23 ENCOUNTER — Encounter: Payer: 59 | Admitting: Neurology

## 2018-04-29 ENCOUNTER — Other Ambulatory Visit: Payer: Self-pay | Admitting: Family Medicine

## 2018-04-29 DIAGNOSIS — R0602 Shortness of breath: Secondary | ICD-10-CM

## 2018-06-11 ENCOUNTER — Other Ambulatory Visit: Payer: Self-pay | Admitting: Family Medicine

## 2018-06-11 DIAGNOSIS — R0602 Shortness of breath: Secondary | ICD-10-CM

## 2018-06-11 NOTE — Telephone Encounter (Signed)
Copied from CRM [id number]. Topic: Quick Communication - Rx Refill/Question >> Jun 11, 2018  4:45 PM Jens Som A wrote: Medication: pantoprazole (PROTONIX) 20 MG tablet [[id number]]   Has the patient contacted their pharmacy? Yes  (Agent: If no, request that the patient contact the pharmacy for the refill.) (Agent: If yes, when and what did the pharmacy advise?)  Preferred Pharmacy (with phone number or street name): [facility] [address] [place] - [address] [place] [phone] (Phone) [phone] (Fax)    Agent: Please be advised that RX refills may take up to 3 business days. We ask that you follow-up with your pharmacy.

## 2018-06-12 MED ORDER — PANTOPRAZOLE SODIUM 20 MG PO TBEC: 20.0000 mg | DELAYED_RELEASE_TABLET | Freq: Every day | ORAL | 0 refills | Status: DC

## 2018-06-12 NOTE — Telephone Encounter (Signed)
Requested Prescriptions  Pending Prescriptions Disp Refills  . pantoprazole (PROTONIX) 20 MG tablet 90 tablet 0    Sig: Take 1 tablet (20 mg total) by mouth daily.     Gastroenterology: Proton Pump Inhibitors Passed - 06/12/2018  8:39 AM      Passed - Valid encounter within last 12 months    Recent Outpatient Visits          3 months ago SOB (shortness of breath)   [facility] Bettey MareShannon R, MD   3 months ago SOB (shortness of breath)   [facility] Bettey MareShannon R, MD

## 2018-07-31 ENCOUNTER — Telehealth: Payer: Self-pay | Admitting: Family Medicine

## 2018-07-31 NOTE — Telephone Encounter (Signed)
Copied from CRM [id number]. Topic: Quick Communication - See Telephone Encounter >> Jul 31, 2018  3:06 PM Terisa Starr wrote: CRM for notification. See Telephone encounter for: 07/31/18.  Patient states she needs a new script pantoprazole (PROTONIX) 20 MG tablet and for it to be written for only 30 tablets because that is all that they will cover. They will not cover 90 tablets. Please advise.

## 2018-08-03 ENCOUNTER — Other Ambulatory Visit: Payer: Self-pay

## 2018-08-03 MED ORDER — IBUPROFEN 200 MG PO TABS: 200.0000 mg | ORAL_TABLET | Freq: Four times a day (QID) | ORAL | 0 refills | Status: DC | PRN

## 2018-08-03 NOTE — Telephone Encounter (Signed)
Rx was sent to pt pharmacy as requested °

## 2018-10-19 ENCOUNTER — Other Ambulatory Visit: Payer: Self-pay

## 2018-10-19 ENCOUNTER — Encounter: Payer: Self-pay | Admitting: Family Medicine

## 2018-10-19 ENCOUNTER — Ambulatory Visit: Payer: 59 | Admitting: Family Medicine

## 2018-10-19 NOTE — Progress Notes (Signed)
Pt opened the doxy video link via text at 4:33 pm for her 4:00pm appointment.  Unfortunately this provider was unable to join the call as another pt was being seen.  This provider attempted to contact pt again after finishing the other appointment but she did not answer the phone.

## 2018-10-19 NOTE — Progress Notes (Signed)
Pt did not connect via webex for appointment.  Attempted to connect via doxy, however pt did not open text message.

## 2018-10-20 ENCOUNTER — Ambulatory Visit (INDEPENDENT_AMBULATORY_CARE_PROVIDER_SITE_OTHER): Payer: 59 | Admitting: Family Medicine

## 2018-10-20 ENCOUNTER — Encounter: Payer: Self-pay | Admitting: Family Medicine

## 2018-10-20 ENCOUNTER — Other Ambulatory Visit: Payer: Self-pay

## 2018-10-20 DIAGNOSIS — R202 Paresthesia of skin: Secondary | ICD-10-CM

## 2018-10-20 DIAGNOSIS — M79602 Pain in left arm: Secondary | ICD-10-CM

## 2018-10-20 DIAGNOSIS — R200 Anesthesia of skin: Secondary | ICD-10-CM | POA: Diagnosis not present

## 2018-10-20 MED ORDER — IBUPROFEN 800 MG PO TABS: 800.0000 mg | ORAL_TABLET | Freq: Three times a day (TID) | ORAL | 0 refills | Status: DC | PRN

## 2018-10-20 MED ORDER — CYCLOBENZAPRINE HCL 5 MG PO TABS: 5.0000 mg | ORAL_TABLET | Freq: Three times a day (TID) | ORAL | 0 refills | Status: DC | PRN

## 2018-10-20 NOTE — Progress Notes (Signed)
Virtual Visit via Video Note  I connected with Megan Good on 10/20/18 at  1:00 PM EDT by a video enabled telemedicine application and verified that I am speaking with the correct person using two identifiers.  Location patient: home Location provider:work or home office Persons participating in the virtual visit: patient, provider  I discussed the limitations of evaluation and management by telemedicine and the availability of in person appointments. The patient expressed understanding and agreed to proceed.   HPI: Pt picked up a heavy floor lamp x 1 wk ago. She immediately felt a sharp pain in her R shoulder, like a toothache and a pain in her stomach that resolved.  She now has numbness and tingling in her L arm/hand, numbness in the toes of L foot, and pain in butt.  Pt endorses mild edema in r shoulder.  Taking ibuprofen 200 mg that helped some.  Pt states she had a pinched nerve before and this feels similar.  Unable to sleep at night as cannot get comfortable.  Pt denies weakness in extremities, loss of bowel or bladder.   ROS: See pertinent positives and negatives per HPI.  Past Medical History:  Diagnosis Date  . Headache     Past Surgical History:  Procedure Laterality Date  . TONSILLECTOMY      Family History  Problem Relation Age of Onset  . Healthy Mother 47  . Other Father        MVA    SOCIAL HX:    Current Outpatient Medications:  .  ibuprofen (ADVIL,MOTRIN) 200 MG tablet, Take 1 tablet (200 mg total) by mouth every 6 (six) hours as needed for mild pain., Disp: 30 tablet, Rfl: 0 .  pantoprazole (PROTONIX) 20 MG tablet, Take 1 tablet (20 mg total) by mouth daily., Disp: 90 tablet, Rfl: 0  EXAM:  VITALS per patient if applicable:  RR between 12-20 bpm  GENERAL: alert, oriented, appears well and in no acute distress  HEENT: atraumatic, conjunctiva clear, no obvious abnormalities on inspection of external nose and ears  NECK: normal movements of the  head and neck  LUNGS: on inspection no signs of respiratory distress, breathing rate appears normal, no obvious gross SOB, gasping or wheezing  CV: no obvious cyanosis  MS: moves all visible extremities without noticeable abnormality  PSYCH/NEURO: pleasant and cooperative, no obvious depression or anxiety, speech and thought processing grossly intact  ASSESSMENT AND PLAN:  Discussed the following assessment and plan:  Numbness and tingling of left upper extremity -discussed possible causes including nerve compression 2/2 disc protrusion -discussed obtaining imaging, but pt declines.  Pt opts to try supportive care first and if symptoms continue obtain imaging. -discussed heat, muscle relaxer, NSAIDs -pt given precautions for worsening symptoms -consider prednisone taper   Left arm pain  - Plan: cyclobenzaprine (FLEXERIL) 5 MG tablet, ibuprofen (ADVIL) 800 MG tablet  F/u prn   I discussed the assessment and treatment plan with the patient. The patient was provided an opportunity to ask questions and all were answered. The patient agreed with the plan and demonstrated an understanding of the instructions.   The patient was advised to call back or seek an in-person evaluation if the symptoms worsen or if the condition fails to improve as anticipated.   Deeann Saint, MD

## 2018-11-03 ENCOUNTER — Ambulatory Visit (INDEPENDENT_AMBULATORY_CARE_PROVIDER_SITE_OTHER): Payer: Medicare Other | Admitting: Family Medicine

## 2018-11-03 ENCOUNTER — Encounter: Payer: Self-pay | Admitting: Family Medicine

## 2018-11-03 ENCOUNTER — Other Ambulatory Visit: Payer: Self-pay | Admitting: Family Medicine

## 2018-11-03 ENCOUNTER — Other Ambulatory Visit: Payer: Self-pay

## 2018-11-03 ENCOUNTER — Ambulatory Visit: Payer: 59 | Admitting: Family Medicine

## 2018-11-03 ENCOUNTER — Ambulatory Visit: Payer: Self-pay | Admitting: Family Medicine

## 2018-11-03 DIAGNOSIS — R200 Anesthesia of skin: Secondary | ICD-10-CM | POA: Diagnosis not present

## 2018-11-03 DIAGNOSIS — M545 Low back pain, unspecified: Secondary | ICD-10-CM

## 2018-11-03 DIAGNOSIS — R202 Paresthesia of skin: Secondary | ICD-10-CM | POA: Diagnosis not present

## 2018-11-03 DIAGNOSIS — M79602 Pain in left arm: Secondary | ICD-10-CM

## 2018-11-03 MED ORDER — CYCLOBENZAPRINE HCL 5 MG PO TABS: 5.0000 mg | ORAL_TABLET | Freq: Three times a day (TID) | ORAL | 0 refills | Status: DC | PRN

## 2018-11-03 NOTE — Telephone Encounter (Signed)
Pt scheduled with Dr. Salomon Fick.

## 2018-11-03 NOTE — Progress Notes (Signed)
Virtual Visit via Video Note  I connected with Megan Good on 11/03/18 at  3:30 PM EDT by a video enabled telemedicine application and verified that I am speaking with the correct person using two identifiers.  Location patient: home Location provider:work or home office Persons participating in the virtual visit: patient, provider  I discussed the limitations of evaluation and management by telemedicine and the availability of in person appointments. The patient expressed understanding and agreed to proceed.   HPI: Pt is a 68 yo female with pmh sig for HAs.    Pt seen 5/12 for numbness and tingling in L arm, toes, and pain in butt after lifting a heavy lamp.  Pt states she is not having the muscle spasms as bad as she was before.  Requesting a refill on the muscle relaxer which has been helping especially at night.  Having increased numbness and tingling in toes of b/l feet.  Feels better if moves around.  Standing and laying for too long are uncomfortable. Denies numbness/tingling in legs, fever, pain or weakness in LEs, loss of bowel or bladder.     ROS: See pertinent positives and negatives per HPI.  Past Medical History:  Diagnosis Date  . Headache     Past Surgical History:  Procedure Laterality Date  . TONSILLECTOMY      Family History  Problem Relation Age of Onset  . Healthy Mother 26  . Other Father        MVA    SOCIAL HX:    Current Outpatient Medications:  .  cyclobenzaprine (FLEXERIL) 5 MG tablet, Take 1 tablet (5 mg total) by mouth 3 (three) times daily as needed for muscle spasms., Disp: 30 tablet, Rfl: 0 .  ibuprofen (ADVIL) 800 MG tablet, Take 1 tablet (800 mg total) by mouth every 8 (eight) hours as needed., Disp: 30 tablet, Rfl: 0 .  pantoprazole (PROTONIX) 20 MG tablet, Take 1 tablet (20 mg total) by mouth daily., Disp: 90 tablet, Rfl: 0  EXAM:  VITALS per patient if applicable:  RR between 12-20 bpm  GENERAL: alert, oriented, appears well  and in no acute distress  HEENT: atraumatic, conjunctiva clear, no obvious abnormalities on inspection of external nose and ears  NECK: normal movements of the head and neck  LUNGS: on inspection no signs of respiratory distress, breathing rate appears normal, no obvious gross SOB, gasping or wheezing  CV: no obvious cyanosis  MS: Sitting straight in a chair appears mildly uncomfortable.  Moves all visible extremities without noticeable abnormality.  PSYCH/NEURO: pleasant and cooperative, no obvious depression or anxiety, speech and thought processing grossly intact  ASSESSMENT AND PLAN:  Discussed the following assessment and plan:  Lumbar back pain  - Plan: cyclobenzaprine (FLEXERIL) 5 MG tablet, MR Lumbar Spine Wo Contrast  Numbness and tingling of both feet  -will proceed with imaging given continued radicular symptoms -also discussed considering PT. - Plan: MR Lumbar Spine Wo Contrast  F/u prn   I discussed the assessment and treatment plan with the patient. The patient was provided an opportunity to ask questions and all were answered. The patient agreed with the plan and demonstrated an understanding of the instructions.   The patient was advised to call back or seek an in-person evaluation if the symptoms worsen or if the condition fails to improve as anticipated.    Deeann Saint, MD

## 2018-11-03 NOTE — Telephone Encounter (Signed)
Copied from CRM [id number]. Topic: Quick Communication - Rx Refill/Question >> Nov 03, 2018  9:18 AM Doreatha Massed wrote: Medication: Cyclobenzaprine 5mg   Has the patient contacted their pharmacy? No (Agent: If no, request that the patient contact the pharmacy for the refill.) pt has no more refills (Agent: If yes, when and what did the pharmacy advise?)  Preferred Pharmacy (with phone number or street name): [facility] Agent: Please be advised that RX refills may take up to 3 business days. We ask that you follow-up with your pharmacy.

## 2018-11-03 NOTE — Telephone Encounter (Signed)
Pt. Reports she had lifted something heavy a couple of weeks ago. Now she is having tingling/numbness in her feet and ankles. Warm transfer to Tammy in the practice for visit.   Answer Assessment - Initial Assessment Questions 1. ONSET: "When did the pain start?"      A couple of weeks 2. LOCATION: "Where is the pain located?"      Both feet and ankles 3. PAIN: "How bad is the pain?"    (Scale 1-10; or mild, moderate, severe)   -  MILD (1-3): doesn't interfere with normal activities    -  MODERATE (4-7): interferes with normal activities (e.g., work or school) or awakens from sleep, limping    -  SEVERE (8-10): excruciating pain, unable to do any normal activities, unable to walk     Numbness 4. WORK OR EXERCISE: "Has there been any recent work or exercise that involved this part of the body?"      No 5. CAUSE: "What do you think is causing the foot pain?"     Lifted something heavy 6. OTHER SYMPTOMS: "Do you have any other symptoms?" (e.g., leg pain, rash, fever, numbness)     Some numbness 7. PREGNANCY: "Is there any chance you are pregnant?" "When was your last menstrual period?"     No  Protocols used: FOOT PAIN-A-AH

## 2018-11-04 NOTE — Telephone Encounter (Signed)
Rx was sent to pt pharmacy for refill 

## 2018-12-16 ENCOUNTER — Other Ambulatory Visit: Payer: 59

## 2019-03-02 ENCOUNTER — Telehealth (INDEPENDENT_AMBULATORY_CARE_PROVIDER_SITE_OTHER): Payer: Medicare Other | Admitting: Family Medicine

## 2019-03-02 ENCOUNTER — Ambulatory Visit: Payer: Self-pay | Admitting: *Deleted

## 2019-03-02 ENCOUNTER — Encounter: Payer: Self-pay | Admitting: Family Medicine

## 2019-03-02 ENCOUNTER — Other Ambulatory Visit: Payer: Self-pay

## 2019-03-02 DIAGNOSIS — R42 Dizziness and giddiness: Secondary | ICD-10-CM | POA: Diagnosis not present

## 2019-03-02 NOTE — Telephone Encounter (Signed)
Patient was fine yesterday- patient woke this morning and felt light headed. Patient laid down and when she got up she felt "loopy" and a little short of breath. She was a little panicked and scared. Patient states her breathing is better- she still feels a little "loopy" though. Call to office for appointment.  Reason for Disposition . [1] MODERATE dizziness (e.g., interferes with normal activities) AND [2] has NOT been evaluated by physician for this  (Exception: dizziness caused by heat exposure, sudden standing, or poor fluid intake)  Answer Assessment - Initial Assessment Questions 1. DESCRIPTION: "Describe your dizziness."     Patient felt faint this morning 2. LIGHTHEADED: "Do you feel lightheaded?" (e.g., somewhat faint, woozy, weak upon standing)     Not as bad 3. VERTIGO: "Do you feel like either you or the room is spinning or tilting?" (i.e. vertigo)     no 4. SEVERITY: "How bad is it?"  "Do you feel like you are going to faint?" "Can you stand and walk?"   - MILD - walking normally   - MODERATE - interferes with normal activities (e.g., work, school)    - SEVERE - unable to stand, requires support to walk, feels like passing out now.      moderate 5. ONSET:  "When did the dizziness begin?"     This morning 6. AGGRAVATING FACTORS: "Does anything make it worse?" (e.g., standing, change in head position)     standing 7. HEART RATE: "Can you tell me your heart rate?" "How many beats in 15 seconds?"  (Note: not all patients can do this)       At first it was fast- feels normal and steady 8. CAUSE: "What do you think is causing the dizziness?"     No idea 9. RECURRENT SYMPTOM: "Have you had dizziness before?" If so, ask: "When was the last time?" "What happened that time?"     Happened last year- unable to get proper diagnosis 10. OTHER SYMPTOMS: "Do you have any other symptoms?" (e.g., fever, chest pain, vomiting, diarrhea, bleeding)       no 11. PREGNANCY: "Is there any chance  you are pregnant?" "When was your last menstrual period?"       n/a  Protocols used: DIZZINESS [facility]

## 2019-03-02 NOTE — Patient Instructions (Signed)
You will need to see your primary doctor or your neurologist for evaluation. Seek urgent care at an urgent care or the hospital if worsening or severe symptoms. I sent a message to our schedulers to assist. Please call our office if you have not heard from them by tomorrow.    Please see the link below for more information and a treatment for one type of vertigo.  [email or web address]  Please do not drive with vertigo.  I hope you are feeling better soon!

## 2019-03-02 NOTE — Telephone Encounter (Signed)
Pt has been scheduled.  °

## 2019-03-02 NOTE — Progress Notes (Signed)
Virtual Visit via Telephone Note  I connected with [name] on 03/02/19 at  1:20 PM EDT by telephone and verified that I am speaking with the correct person using two identifiers.   I discussed the limitations, risks, security and privacy concerns of performing an evaluation and management service by telephone and the availability of in person appointments. I also discussed with the patient that there may be a patient responsible charge related to this service. The patient expressed understanding and agreed to proceed.  Location patient: home Location provider: work or home office Participants present for the call: patient, provider Patient did not have a visit in the prior 7 days to address this/these issue(s).   History of Present Illness:   Acute visit for dizziness: -intermittent issues in the past with this as well she reports - usually when goes to get up too quickly -had issue this morning, acute dizziness when she went to get up this morning - whoozy and could not walk straight, brief spell, is ok if sits up and doesn't move, feels ok now -she has some numbness in her feet at times, but that is a chronic issue she is seeing her PCP for -when she had this issue in the past she had an extensive work up and even saw a neurologist, reports had neuroimaging - then issues resolved for awhile -denies fevers, chills, CP, SOB, HA, weakness, vision changes   Observations/Objective: Patient sounds cheerful and well on the phone. I do not appreciate any SOB. Speech and thought processing are grossly intact. Patient reported vitals:  Assessment and Plan:  Dizziness  -we discussed possible serious and likely etiologies, options for evaluation and workup, limitations of telemedicine visit vs in person visit, treatment, treatment risks and precautions. Pt prefers to discuss via telemedicine empirically today rather then risking or undertaking an in person visit at this moment.  Query BPPV or positional hypotension given brief spells triggered by certain movements (getting up) and prior hx of same. Advised will need inperson exam with PCP or neurologist though to further evaluate - she agreed to seeing PCP but wants to wait until next week. Sent message to scheduling to assist and advised pt to call for appt if scheduling staff does not reach out to her by tomorrow. Declines going elsewhere for further evaluation in the interim or a prompt visit. She wants to try home treatment for BPPV in the interim. Let her know about Dr. Mercy Riding videos regarding this. Advised prompt urgent care or ER visit in the interim if needed. Advised not to drive with vertigo. Advised caution when getting up. Patient agrees to seek prompt in person care if worsening, new symptoms arise, or if is not improving with treatment.  I discussed the assessment and treatment plan with the patient. The patient was provided an opportunity to ask questions and all were answered. The patient agreed with the plan and demonstrated an understanding of the instructions.   I provided 18 minutes of non-face-to-face time during this encounter.   Terressa Koyanagi, DO   Patient Instructions  You will need to see your primary doctor or your neurologist for evaluation. Seek urgent care at an urgent care or the hospital if worsening or severe symptoms. I sent a message to our schedulers to assist. Please call our office if you have not heard from them by tomorrow.    Please see the link below for more information and a treatment for one type of vertigo.  [email or web address]  Please do not drive with vertigo.  I hope you are feeling better soon!

## 2019-03-04 NOTE — Progress Notes (Signed)
-  03/04/19- called pt to make 1 wk f/u in office per Dr Kim--pt's vm was full//tes

## 2019-03-25 ENCOUNTER — Ambulatory Visit: Payer: Medicare Other | Admitting: Family Medicine

## 2019-04-22 ENCOUNTER — Ambulatory Visit: Payer: Self-pay | Admitting: Family Medicine

## 2019-07-27 ENCOUNTER — Other Ambulatory Visit: Payer: Self-pay

## 2019-07-28 ENCOUNTER — Ambulatory Visit: Payer: Medicare PPO | Admitting: Family Medicine

## 2019-07-28 ENCOUNTER — Ambulatory Visit: Payer: Medicare PPO

## 2019-07-28 ENCOUNTER — Encounter: Payer: Self-pay | Admitting: Family Medicine

## 2019-07-28 VITALS — BP 158/84 | HR 88 | Temp 96.7°F | Wt 150.0 lb

## 2019-07-28 DIAGNOSIS — R200 Anesthesia of skin: Secondary | ICD-10-CM

## 2019-07-28 DIAGNOSIS — M25512 Pain in left shoulder: Secondary | ICD-10-CM

## 2019-07-28 DIAGNOSIS — R0789 Other chest pain: Secondary | ICD-10-CM

## 2019-07-28 DIAGNOSIS — G8929 Other chronic pain: Secondary | ICD-10-CM

## 2019-07-28 MED ORDER — PREDNISONE 10 MG PO TABS: ORAL_TABLET | ORAL | 0 refills | Status: DC

## 2019-07-28 NOTE — Progress Notes (Signed)
Subjective:    Patient ID: Megan Good, female    DOB: Mar 29, 1951, 69 y.o.   MRN: [id number]  No chief complaint on file.   HPI Patient was seen today for ongoing concern.  Pt with L shoulder and chest pain x months.  Unable to sleep on L side 2/2 pain.  Pain occurs with certain movements (liftign and reaching), sitting in certain chairs, and with pushing the shopping cart.  Tried heat, pain patches.  Denies injury, hearing any popping, clicking, or tears.  Pt notes prior h/o bursitis in b/l shoulders.  Pt notes ongoing numbness in L 5th toe.  Happens after walking for exercise.  Wears sneakers.    Past Medical History:  Diagnosis Date  . Headache     Allergies  Allergen Reactions  . Penicillins Swelling and Rash    Has patient had a PCN reaction causing immediate rash, facial/tongue/throat swelling, SOB or lightheadedness with hypotension: No Has patient had a PCN reaction causing severe rash involving mucus membranes or skin necrosis: No Has patient had a PCN reaction that required hospitalization: No Has patient had a PCN reaction occurring within the last 10 years: No If all of the above answers are "NO", then may proceed with Cephalosporin use.  Burn like rash    ROS General: Denies fever, chills, night sweats, changes in weight, changes in appetite HEENT: Denies headaches, ear pain, changes in vision, rhinorrhea, sore throat CV: Denies CP, palpitations, SOB, orthopnea Pulm: Denies SOB, cough, wheezing GI: Denies abdominal pain, nausea, vomiting, diarrhea, constipation GU: Denies dysuria, hematuria, frequency, vaginal discharge Msk: Denies muscle cramps   +L shoulder and chest pain Neuro: Denies weakness, tingling  +numbness in L 5th toe Skin: Denies rashes, bruising Psych: Denies depression, anxiety, hallucinations      Objective:    There were no vitals taken for this visit.   Gen. Pleasant, well-nourished, in no distress, normal affect  HEENT: [place]/AT,  face symmetric, no scleral icterus, PERRLA, EOMI, nares patent without drainage Lungs: no accessory muscle use, CTAB, no wheezes or rales Cardiovascular: RRR, no m/r/g, no peripheral edema Musculoskeletal: TTP of mid thoracic spine and paraspinal muscles in Upper thoracic and L lumbar area.  TTP of L shoulder at distal clavicle, AC joint, and scapula.  TTP of L upper chest and L bicep. +Neers, [place].  Pain in L shoulder with reaching behind back.  No pain in shoulders or arms with axial loading.  Mild edema of distal L bicep in AC fossa. No deformities, no cyanosis or clubbing, normal tone Neuro:  A&Ox3, CN II-XII intact, normal gait Skin:  Warm, no lesions/ rash   Wt Readings from Last 3 Encounters:  03/04/18 140 lb (63.5 kg)  03/02/18 138 lb (62.6 kg)  02/20/18 144 lb (65.3 kg)    Lab Results  Component Value Date   WBC 6.2 02/25/2018   HGB 13.1 02/25/2018   HCT 38.7 02/25/2018   PLT 166 02/25/2018   GLUCOSE 100 (H) 02/25/2018   CHOL 265 (H) 02/20/2018   TRIG 77.0 02/20/2018   HDL 68.90 02/20/2018   LDLCALC 180 (H) 02/20/2018   NA 144 02/25/2018   K 3.2 (L) 02/25/2018   CL 105 02/25/2018   CREATININE 0.92 02/25/2018   BUN 8 02/25/2018   CO2 22 02/25/2018    Assessment/Plan:  Chronic left shoulder pain  -discussed possible causes including arthritis, bursitis, rotator cuff tear. -supportive care  -consider OTC Voltaren gel -pt decided to return next wk for Xray. -  Plan: predniSONE (DELTASONE) 10 MG tablet, DG Shoulder Left  Other chest pain  - Plan: predniSONE (DELTASONE) 10 MG tablet  Numbness of toes -discussed obtaining labs.  Pt wishes to wait until CPE in a few wks  F/u in 2 wks for CPE  Grier Mitts, MD

## 2019-07-28 NOTE — Patient Instructions (Addendum)
Voltaren gel can be found over the counter at your local drug store.  Shoulder Pain Many things can cause shoulder pain, including:  An injury to the shoulder.  Overuse of the shoulder.  Arthritis. The source of the pain can be:  Inflammation.  An injury to the shoulder joint.  An injury to a tendon, ligament, or bone. Follow these instructions at home: Pay attention to changes in your symptoms. Let your health care provider know about them. Follow these instructions to relieve your pain. If you have a sling:  Wear the sling as told by your health care provider. Remove it only as told by your health care provider.  Loosen the sling if your fingers tingle, become numb, or turn cold and blue.  Keep the sling clean.  If the sling is not waterproof: ? Do not let it get wet. Remove it to shower or bathe.  Move your arm as little as possible, but keep your hand moving to prevent swelling. Managing pain, stiffness, and swelling   If directed, put ice on the painful area: ? Put ice in a plastic bag. ? Place a towel between your skin and the bag. ? Leave the ice on for 20 minutes, 2-3 times per day. Stop applying ice if it does not help with the pain.  Squeeze a soft ball or a foam pad as much as possible. This helps to keep the shoulder from swelling. It also helps to strengthen the arm. General instructions  Take over-the-counter and prescription medicines only as told by your health care provider.  Keep all follow-up visits as told by your health care provider. This is important. Contact a health care provider if:  Your pain gets worse.  Your pain is not relieved with medicines.  New pain develops in your arm, hand, or fingers. Get help right away if:  Your arm, hand, or fingers: ? Tingle. ? Become numb. ? Become swollen. ? Become painful. ? Turn white or blue. Summary  Shoulder pain can be caused by an injury, overuse, or arthritis.  Pay attention to changes  in your symptoms. Let your health care provider know about them.  This condition may be treated with a sling, ice, and pain medicines.  Contact your health care provider if the pain gets worse or new pain develops. Get help right away if your arm, hand, or fingers tingle or become numb, swollen, or painful.  Keep all follow-up visits as told by your health care provider. This is important. This information is not intended to replace advice given to you by your health care provider. Make sure you discuss any questions you have with your health care provider. Document Revised: 12/09/2017 Document Reviewed: 12/09/2017 Elsevier Patient Education  [place].  Musculoskeletal Pain Musculoskeletal pain refers to aches and pains in your bones, joints, muscles, and the tissues that surround them. This pain can occur in any part of the body. It can last for a short time (acute) or a long time (chronic). A physical exam, lab tests, and imaging studies may be done to find the cause of your musculoskeletal pain. Follow these instructions at home:  Lifestyle  Try to control or lower your stress levels. Stress increases muscle tension and can worsen musculoskeletal pain. It is important to recognize when you are anxious or stressed and learn ways to manage it. This may include: ? Meditation or yoga. ? Cognitive or behavioral therapy. ? Acupuncture or massage therapy.  You may continue all activities  unless the activities cause more pain. When the pain gets better, slowly resume your normal activities. Gradually increase the intensity and duration of your activities or exercise. Managing pain, stiffness, and swelling  Take over-the-counter and prescription medicines only as told by your health care provider.  When your pain is severe, bed rest may be helpful. Lie or sit in any position that is comfortable, but get out of bed and walk around at least every couple of hours.  If directed, apply  heat to the affected area as often as told by your health care provider. Use the heat source that your health care provider recommends, such as a moist heat pack or a heating pad. ? Place a towel between your skin and the heat source. ? Leave the heat on for 20-30 minutes. ? Remove the heat if your skin turns bright red. This is especially important if you are unable to feel pain, heat, or cold. You may have a greater risk of getting burned.  If directed, put ice on the painful area. ? Put ice in a plastic bag. ? Place a towel between your skin and the bag. ? Leave the ice on for 20 minutes, 2-3 times a day. General instructions  Your health care provider may recommend that you see a physical therapist. This person can help you come up with a safe exercise program. Do any exercises as told by your physical therapist.  Keep all follow-up visits, including any physical therapy visits, as told by your health care providers. This is important. Contact a health care provider if:  Your pain gets worse.  Medicines do not help ease your pain.  You cannot use the part of your body that hurts, such as your arm, leg, or neck.  You have trouble sleeping.  You have trouble doing your normal activities. Get help right away if:  You have a new injury and your pain is worse or different.  You feel numb or you have tingling in the painful area. Summary  Musculoskeletal pain refers to aches and pains in your bones, joints, muscles, and the tissues that surround them.  This pain can occur in any part of the body.  Your health care provider may recommend that you see a physical therapist. This person can help you come up with a safe exercise program. Do any exercises as told by your physical therapist.  Lower your stress level. Stress can worsen musculoskeletal pain. Ways to lower stress may include meditation, yoga, cognitive or behavioral therapy, acupuncture, and massage therapy. This  information is not intended to replace advice given to you by your health care provider. Make sure you discuss any questions you have with your health care provider. Document Revised: 05/09/2017 Document Reviewed: 06/26/2016 Elsevier Patient Education  2020 [employer].

## 2019-08-06 ENCOUNTER — Ambulatory Visit: Payer: Medicare PPO | Attending: Internal Medicine

## 2019-08-06 DIAGNOSIS — Z23 Encounter for immunization: Secondary | ICD-10-CM

## 2019-08-06 NOTE — Progress Notes (Signed)
   Covid-[facility] Vaccination [facility]  Name:  Megan Good    MRN: [id number] DOB: 03/27/1951  08/06/2019  [name] was observed post Covid-19 immunization for 15 minutes without incidence. She was provided with Vaccine Information Sheet and instruction to access the V-Safe system.   [name] was instructed to call 911 with any severe reactions post vaccine: Marland Kitchen Difficulty breathing  . Swelling of your face and throat  . A fast heartbeat  . A bad rash all over your body  . Dizziness and weakness    Immunizations Administered    Name Date Dose VIS Date Route   Pfizer COVID-19 Vaccine 08/06/2019  3:58 PM 0.3 mL 05/21/2019 Intramuscular   Manufacturer: [employer], [employer]   Lot: [id number]   NDC: [address]-8

## 2019-08-11 ENCOUNTER — Other Ambulatory Visit: Payer: Self-pay

## 2019-08-11 ENCOUNTER — Ambulatory Visit (INDEPENDENT_AMBULATORY_CARE_PROVIDER_SITE_OTHER): Payer: Medicare PPO

## 2019-08-11 ENCOUNTER — Ambulatory Visit (INDEPENDENT_AMBULATORY_CARE_PROVIDER_SITE_OTHER): Payer: Medicare PPO | Admitting: Family Medicine

## 2019-08-11 ENCOUNTER — Encounter: Payer: Self-pay | Admitting: Family Medicine

## 2019-08-11 VITALS — BP 164/76 | HR 102 | Temp 98.4°F | Wt 148.0 lb

## 2019-08-11 DIAGNOSIS — G8929 Other chronic pain: Secondary | ICD-10-CM

## 2019-08-11 DIAGNOSIS — M545 Low back pain, unspecified: Secondary | ICD-10-CM

## 2019-08-11 DIAGNOSIS — Z0000 Encounter for general adult medical examination without abnormal findings: Secondary | ICD-10-CM | POA: Diagnosis not present

## 2019-08-11 DIAGNOSIS — M19012 Primary osteoarthritis, left shoulder: Secondary | ICD-10-CM | POA: Diagnosis not present

## 2019-08-11 DIAGNOSIS — F489 Nonpsychotic mental disorder, unspecified: Secondary | ICD-10-CM

## 2019-08-11 DIAGNOSIS — F439 Reaction to severe stress, unspecified: Secondary | ICD-10-CM

## 2019-08-11 DIAGNOSIS — M25512 Pain in left shoulder: Secondary | ICD-10-CM | POA: Diagnosis not present

## 2019-08-11 DIAGNOSIS — Z13220 Encounter for screening for lipoid disorders: Secondary | ICD-10-CM

## 2019-08-11 DIAGNOSIS — R002 Palpitations: Secondary | ICD-10-CM | POA: Diagnosis not present

## 2019-08-11 LAB — CBC WITH DIFFERENTIAL/PLATELET
Basophils Absolute: 0 10*3/uL (ref 0.0–0.1)
Basophils Relative: 0.6 % (ref 0.0–3.0)
Eosinophils Absolute: 0 10*3/uL (ref 0.0–0.7)
Eosinophils Relative: 0.4 % (ref 0.0–5.0)
HCT: 39 % (ref 36.0–46.0)
Hemoglobin: 12.8 g/dL (ref 12.0–15.0)
Lymphocytes Relative: 19.9 % (ref 12.0–46.0)
Lymphs Abs: 1.3 10*3/uL (ref 0.7–4.0)
MCHC: 32.8 g/dL (ref 30.0–36.0)
MCV: 87.5 fl (ref 78.0–100.0)
Monocytes Absolute: 0.5 10*3/uL (ref 0.1–1.0)
Monocytes Relative: 7.6 % (ref 3.0–12.0)
Neutro Abs: 4.8 10*3/uL (ref 1.4–7.7)
Neutrophils Relative %: 71.5 % (ref 43.0–77.0)
Platelets: 150 10*3/uL (ref 150.0–400.0)
RBC: 4.45 Mil/uL (ref 3.87–5.11)
RDW: 13.5 % (ref 11.5–15.5)
WBC: 6.7 10*3/uL (ref 4.0–10.5)

## 2019-08-11 LAB — TSH: TSH: 0.19 u[IU]/mL — ABNORMAL LOW (ref 0.35–4.50)

## 2019-08-11 LAB — LIPID PANEL
Cholesterol: 249 mg/dL — ABNORMAL HIGH (ref 0–200)
HDL: 70.4 mg/dL (ref >39.00)
LDL Cholesterol: 165 mg/dL — ABNORMAL HIGH (ref 0–99)
NonHDL: 178.52
Total CHOL/HDL Ratio: 4
Triglycerides: 69 mg/dL (ref 0.0–149.0)
VLDL: 13.8 mg/dL (ref 0.0–40.0)

## 2019-08-11 LAB — BASIC METABOLIC PANEL
BUN: 8 mg/dL (ref 6–23)
CO2: 27 mEq/L (ref 19–32)
Calcium: 9.1 mg/dL (ref 8.4–10.5)
Chloride: 104 mEq/L (ref 96–112)
Creatinine, Ser: 0.73 mg/dL (ref 0.40–1.20)
GFR: 95.37 mL/min (ref >60.00)
Glucose, Bld: 95 mg/dL (ref 70–99)
Potassium: 3.7 mEq/L (ref 3.5–5.1)
Sodium: 139 mEq/L (ref 135–145)

## 2019-08-11 LAB — HEMOGLOBIN A1C: Hgb A1c MFr Bld: 5.3 % (ref 4.6–6.5)

## 2019-08-11 LAB — T4, FREE: Free T4: 1.3 ng/dL (ref 0.60–1.60)

## 2019-08-11 MED ORDER — CYCLOBENZAPRINE HCL 5 MG PO TABS: 5.0000 mg | ORAL_TABLET | Freq: Three times a day (TID) | ORAL | 1 refills | Status: DC | PRN

## 2019-08-11 NOTE — Patient Instructions (Signed)
Health Maintenance After Age 70 After age 61, you are at a higher risk for certain long-term diseases and infections as well as injuries from falls. Falls are a major cause of broken bones and head injuries in people who are older than age 57. Getting regular preventive care can help to keep you healthy and well. Preventive care includes getting regular testing and making lifestyle changes as recommended by your health care provider. Talk with your health care provider about:  Which screenings and tests you should have. A screening is a test that checks for a disease when you have no symptoms.  A diet and exercise plan that is right for you. What should I know about screenings and tests to prevent falls? Screening and testing are the best ways to find a health problem early. Early diagnosis and treatment give you the best chance of managing medical conditions that are common after age 37. Certain conditions and lifestyle choices may make you more likely to have a fall. Your health care provider may recommend:  Regular vision checks. Poor vision and conditions such as cataracts can make you more likely to have a fall. If you wear glasses, make sure to get your prescription updated if your vision changes.  Medicine review. Work with your health care provider to regularly review all of the medicines you are taking, including over-the-counter medicines. Ask your health care provider about any side effects that may make you more likely to have a fall. Tell your health care provider if any medicines that you take make you feel dizzy or sleepy.  Osteoporosis screening. Osteoporosis is a condition that causes the bones to get weaker. This can make the bones weak and cause them to break more easily.  Blood pressure screening. Blood pressure changes and medicines to control blood pressure can make you feel dizzy.  Strength and balance checks. Your health care provider may recommend certain tests to check your  strength and balance while standing, walking, or changing positions.  Foot health exam. Foot pain and numbness, as well as not wearing proper footwear, can make you more likely to have a fall.  Depression screening. You may be more likely to have a fall if you have a fear of falling, feel emotionally low, or feel unable to do activities that you used to do.  Alcohol use screening. Using too much alcohol can affect your balance and may make you more likely to have a fall. What actions can I take to lower my risk of falls? General instructions  Talk with your health care provider about your risks for falling. Tell your health care provider if: ? You fall. Be sure to tell your health care provider about all falls, even ones that seem minor. ? You feel dizzy, sleepy, or off-balance.  Take over-the-counter and prescription medicines only as told by your health care provider. These include any supplements.  Eat a healthy diet and maintain a healthy weight. A healthy diet includes low-fat dairy products, low-fat (lean) meats, and fiber from whole grains, beans, and lots of fruits and vegetables. Home safety  Remove any tripping hazards, such as rugs, cords, and clutter.  Install safety equipment such as grab bars in bathrooms and safety rails on stairs.  Keep rooms and walkways well-lit. Activity   Follow a regular exercise program to stay fit. This will help you maintain your balance. Ask your health care provider what types of exercise are appropriate for you.  If you need a cane or  walker, use it as recommended by your health care provider.  Wear supportive shoes that have nonskid soles. Lifestyle  Do not drink alcohol if your health care provider tells you not to drink.  If you drink alcohol, limit how much you have: ? 0-1 drink a day for women. ? 0-2 drinks a day for men.  Be aware of how much alcohol is in your drink. In the [place] one drink equals one typical bottle of beer (12  oz), one-half glass of wine (5 oz), or one shot of hard liquor (1 oz).  Do not use any products that contain nicotine or tobacco, such as cigarettes and e-cigarettes. If you need help quitting, ask your health care provider. Summary  Having a healthy lifestyle and getting preventive care can help to protect your health and wellness after age 22.  Screening and testing are the best way to find a health problem early and help you avoid having a fall. Early diagnosis and treatment give you the best chance for managing medical conditions that are more common for people who are older than age 5.  Falls are a major cause of broken bones and head injuries in people who are older than age 67. Take precautions to prevent a fall at home.  Work with your health care provider to learn what changes you can make to improve your health and wellness and to prevent falls. This information is not intended to replace advice given to you by your health care provider. Make sure you discuss any questions you have with your health care provider. Document Revised: 09/17/2018 Document Reviewed: 04/09/2017 Elsevier Patient Education  [place].  Palpitations Palpitations are feelings that your heartbeat is irregular or is faster than normal. It may feel like your heart is fluttering or skipping a beat. Palpitations are usually not a serious problem. They may be caused by many things, including smoking, caffeine, alcohol, stress, and certain medicines or drugs. Most causes of palpitations are not serious. However, some palpitations can be a sign of a serious problem. You may need further tests to rule out serious medical problems. Follow these instructions at home:     Pay attention to any changes in your condition. Take these actions to help manage your symptoms: Eating and drinking  Avoid foods and drinks that may cause palpitations. These may include: ? Caffeinated coffee, tea, soft drinks, diet pills,  and energy drinks. ? Chocolate. ? Alcohol. Lifestyle  Take steps to reduce your stress and anxiety. Things that can help you relax include: ? Yoga. ? Mind-body activities, such as deep breathing, meditation, or using words and images to create positive thoughts (guided imagery). ? Physical activity, such as swimming, jogging, or walking. Tell your health care provider if your palpitations increase with activity. If you have chest pain or shortness of breath with activity, do not continue the activity until you are seen by your health care provider. ? Biofeedback. This is a method that helps you learn to use your mind to control things in your body, such as your heartbeat.  Do not use drugs, including cocaine or ecstasy. Do not use marijuana.  Get plenty of rest and sleep. Keep a regular bed time. General instructions  Take over-the-counter and prescription medicines only as told by your health care provider.  Do not use any products that contain nicotine or tobacco, such as cigarettes and e-cigarettes. If you need help quitting, ask your health care provider.  Keep all follow-up visits as  told by your health care provider. This is important. These may include visits for further testing if palpitations do not go away or get worse. Contact a health care provider if you:  Continue to have a fast or irregular heartbeat after 24 hours.  Notice that your palpitations occur more often. Get help right away if you:  Have chest pain or shortness of breath.  Have a severe headache.  Feel dizzy or you faint. Summary  Palpitations are feelings that your heartbeat is irregular or is faster than normal. It may feel like your heart is fluttering or skipping a beat.  Palpitations may be caused by many things, including smoking, caffeine, alcohol, stress, certain medicines, and drugs.  Although most causes of palpitations are not serious, some causes can be a sign of a serious medical  problem.  Get help right away if you faint or have chest pain, shortness of breath, a severe headache, or dizziness. This information is not intended to replace advice given to you by your health care provider. Make sure you discuss any questions you have with your health care provider. Document Revised: 07/09/2017 Document Reviewed: 07/09/2017 Elsevier Patient Education  2020 [place], Adult Stress is a normal reaction to life events. Stress is what you feel when life demands more than you are used to, or more than you think you can handle. Some stress can be useful, such as studying for a test or meeting a deadline at work. Stress that occurs too often or for too long can cause problems. It can affect your emotional health and interfere with relationships and normal daily activities. Too much stress can weaken your body's defense system (immune system) and increase your risk for physical illness. If you already have a medical problem, stress can make it worse. What are the causes? All sorts of life events can cause stress. An event that causes stress for one person may not be stressful for another person. Major life events, whether positive or negative, commonly cause stress. Examples include:  Losing a job or starting a new job.  Losing a loved one.  Moving to a new town or home.  Getting married or divorced.  Having a baby.  Getting injured or sick. Less obvious life events can also cause stress, especially if they occur day after day or in combination with each other. Examples include:  Working long hours.  Driving in traffic.  Caring for children.  Being in debt.  Being in a difficult relationship. What are the signs or symptoms? Stress can cause emotional symptoms, including:  Anxiety. This is feeling worried, afraid, on edge, overwhelmed, or out of control.  Anger, including irritation or impatience.  Depression. This is feeling sad, down, helpless, or  guilty.  Trouble focusing, remembering, or making decisions. Stress can cause physical symptoms, including:  Aches and pains. These may affect your head, neck, back, stomach, or other areas of your body.  Tight muscles or a clenched jaw.  Low energy.  Trouble sleeping. Stress can cause unhealthy behaviors, including:  Eating to feel better (overeating) or skipping meals.  Working too much or putting off tasks.  Smoking, drinking alcohol, or using drugs to feel better. How is this diagnosed? Stress is diagnosed through an assessment by your health care provider. He or she may diagnose this condition based on:  Your symptoms and any stressful life events.  Your medical history.  Tests to rule out other causes of your symptoms. Depending on your condition,  your health care provider may refer you to a specialist for further evaluation. How is this treated?  Stress management techniques are the recommended treatment for stress. Medicine is not typically recommended for the treatment of stress. Techniques to reduce your reaction to stressful life events include:  Stress identification. Monitor yourself for symptoms of stress and identify what causes stress for you. These skills may help you to avoid or prepare for stressful events.  Time management. Set your priorities, keep a calendar of events, and learn to say no. Taking these actions can help you avoid making too many commitments. Techniques for coping with stress include:  Rethinking the problem. Try to think realistically about stressful events rather than ignoring them or overreacting. Try to find the positives in a stressful situation rather than focusing on the negatives.  Exercise. Physical exercise can release both physical and emotional tension. The key is to find a form of exercise that you enjoy and do it regularly.  Relaxation techniques. These relax the body and mind. The key is to find one or more that you enjoy  and use the techniques regularly. Examples include: ? Meditation, deep breathing, or progressive relaxation techniques. ? Yoga or tai chi. ? Biofeedback, mindfulness techniques, or journaling. ? Listening to music, being out in nature, or participating in other hobbies.  Practicing a healthy lifestyle. Eat a balanced diet, drink plenty of water, limit or avoid caffeine, and get plenty of sleep.  Having a strong support network. Spend time with family, friends, or other people you enjoy being around. Express your feelings and talk things over with someone you trust. Counseling or talk therapy with a mental health professional may be helpful if you are having trouble managing stress on your own. Follow these instructions at home: Lifestyle   Avoid drugs.  Do not use any products that contain nicotine or tobacco, such as cigarettes, e-cigarettes, and chewing tobacco. If you need help quitting, ask your health care provider.  Limit alcohol intake to no more than 1 drink a day for nonpregnant women and 2 drinks a day for men. One drink equals 12 oz of beer, 5 oz of wine, or 1 oz of hard liquor  Do not use alcohol or drugs to relax.  Eat a balanced diet that includes fresh fruits and vegetables, whole grains, lean meats, fish, eggs, and beans, and low-fat dairy. Avoid processed foods and foods high in added fat, sugar, and salt.  Exercise at least 30 minutes on 5 or more days each week.  Get 7-8 hours of sleep each night. General instructions   Practice stress management techniques as discussed with your health care provider.  Drink enough fluid to keep your urine clear or pale yellow.  Take over-the-counter and prescription medicines only as told by your health care provider.  Keep all follow-up visits as told by your health care provider. This is important. Contact a health care provider if:  Your symptoms get worse.  You have new symptoms.  You feel overwhelmed by your  problems and can no longer manage them on your own. Get help right away if:  You have thoughts of hurting yourself or others. If you ever feel like you may hurt yourself or others, or have thoughts about taking your own life, get help right away. You can go to your nearest emergency department or call:  Your local emergency services (911 in the [place]  A suicide crisis helpline, such as the [facility] at 661-409-8718.  This is open 24 hours a day. Summary  Stress is a normal reaction to life events. It can cause problems if it happens too often or for too long.  Practicing stress management techniques is the best way to treat stress.  Counseling or talk therapy with a mental health professional may be helpful if you are having trouble managing stress on your own. This information is not intended to replace advice given to you by your health care provider. Make sure you discuss any questions you have with your health care provider. Document Revised: 12/25/2018 Document Reviewed: 07/17/2016 Elsevier Patient Education  [place].  Shoulder Pain Many things can cause shoulder pain, including:  An injury to the shoulder.  Overuse of the shoulder.  Arthritis. The source of the pain can be:  Inflammation.  An injury to the shoulder joint.  An injury to a tendon, ligament, or bone. Follow these instructions at home: Pay attention to changes in your symptoms. Let your health care provider know about them. Follow these instructions to relieve your pain. If you have a sling:  Wear the sling as told by your health care provider. Remove it only as told by your health care provider.  Loosen the sling if your fingers tingle, become numb, or turn cold and blue.  Keep the sling clean.  If the sling is not waterproof: ? Do not let it get wet. Remove it to shower or bathe.  Move your arm as little as possible, but keep your hand moving to prevent  swelling. Managing pain, stiffness, and swelling   If directed, put ice on the painful area: ? Put ice in a plastic bag. ? Place a towel between your skin and the bag. ? Leave the ice on for 20 minutes, 2-3 times per day. Stop applying ice if it does not help with the pain.  Squeeze a soft ball or a foam pad as much as possible. This helps to keep the shoulder from swelling. It also helps to strengthen the arm. General instructions  Take over-the-counter and prescription medicines only as told by your health care provider.  Keep all follow-up visits as told by your health care provider. This is important. Contact a health care provider if:  Your pain gets worse.  Your pain is not relieved with medicines.  New pain develops in your arm, hand, or fingers. Get help right away if:  Your arm, hand, or fingers: ? Tingle. ? Become numb. ? Become swollen. ? Become painful. ? Turn white or blue. Summary  Shoulder pain can be caused by an injury, overuse, or arthritis.  Pay attention to changes in your symptoms. Let your health care provider know about them.  This condition may be treated with a sling, ice, and pain medicines.  Contact your health care provider if the pain gets worse or new pain develops. Get help right away if your arm, hand, or fingers tingle or become numb, swollen, or painful.  Keep all follow-up visits as told by your health care provider. This is important. This information is not intended to replace advice given to you by your health care provider. Make sure you discuss any questions you have with your health care provider. Document Revised: 12/09/2017 Document Reviewed: 12/09/2017 Elsevier Patient Education  [place].

## 2019-08-12 ENCOUNTER — Encounter (HOSPITAL_COMMUNITY): Payer: Self-pay | Admitting: Emergency Medicine

## 2019-08-12 ENCOUNTER — Emergency Department (HOSPITAL_COMMUNITY)
Admission: EM | Admit: 2019-08-12 | Discharge: 2019-08-13 | Disposition: A | Discharge status: Home / Self Care | Payer: Medicare PPO | Attending: Emergency Medicine | Admitting: Emergency Medicine

## 2019-08-12 ENCOUNTER — Other Ambulatory Visit: Payer: Self-pay

## 2019-08-12 ENCOUNTER — Emergency Department (HOSPITAL_COMMUNITY): Payer: Medicare PPO

## 2019-08-12 DIAGNOSIS — R079 Chest pain, unspecified: Secondary | ICD-10-CM | POA: Diagnosis not present

## 2019-08-12 DIAGNOSIS — R002 Palpitations: Secondary | ICD-10-CM | POA: Insufficient documentation

## 2019-08-12 DIAGNOSIS — M25512 Pain in left shoulder: Secondary | ICD-10-CM

## 2019-08-12 DIAGNOSIS — R0602 Shortness of breath: Secondary | ICD-10-CM | POA: Insufficient documentation

## 2019-08-12 LAB — CBC
HCT: 39.8 % (ref 36.0–46.0)
Hemoglobin: 13.2 g/dL (ref 12.0–15.0)
MCH: 28.3 pg (ref 26.0–34.0)
MCHC: 33.2 g/dL (ref 30.0–36.0)
MCV: 85.2 fL (ref 80.0–100.0)
Platelets: 165 10*3/uL (ref 150–400)
RBC: 4.67 MIL/uL (ref 3.87–5.11)
RDW: 13.3 % (ref 11.5–15.5)
WBC: 7.7 10*3/uL (ref 4.0–10.5)
nRBC: 0 % (ref 0.0–0.2)

## 2019-08-12 LAB — BASIC METABOLIC PANEL
Anion gap: 10 (ref 5–15)
BUN: 11 mg/dL (ref 8–23)
CO2: 22 mmol/L (ref 22–32)
Calcium: 8.9 mg/dL (ref 8.9–10.3)
Chloride: 108 mmol/L (ref 98–111)
Creatinine, Ser: 0.89 mg/dL (ref 0.44–1.00)
GFR calc Af Amer: >60 mL/min (ref >60)
GFR calc non Af Amer: >60 mL/min (ref >60)
Glucose, Bld: 104 mg/dL — ABNORMAL HIGH (ref 70–99)
Potassium: 3.8 mmol/L (ref 3.5–5.1)
Sodium: 140 mmol/L (ref 135–145)

## 2019-08-12 LAB — TROPONIN I (HIGH SENSITIVITY)
Troponin I (High Sensitivity): 5 ng/L (ref <18)
Troponin I (High Sensitivity): 8 ng/L (ref <18)

## 2019-08-12 MED ORDER — SODIUM CHLORIDE 0.9% FLUSH
3.0000 mL | Freq: Once | INTRAVENOUS | Status: AC
  Administered 2019-08-13: 07:00:00 3 mL via INTRAVENOUS

## 2019-08-12 NOTE — ED Triage Notes (Addendum)
Pt to ED with c/o left chest pain radiating into left shoulder onset earlier today.  Pt st's she is currently taking a muscle relaxer for pain in left shoulder.  St's she had SOB earlier today but none now  Pt st's the pain comes and goes

## 2019-08-13 ENCOUNTER — Emergency Department (HOSPITAL_COMMUNITY): Payer: Medicare PPO

## 2019-08-13 DIAGNOSIS — R0602 Shortness of breath: Secondary | ICD-10-CM | POA: Diagnosis not present

## 2019-08-13 LAB — D-DIMER, QUANTITATIVE: D-Dimer, Quant: 1.01 ug/mL-FEU — ABNORMAL HIGH (ref 0.00–0.50)

## 2019-08-13 MED ORDER — IOHEXOL 300 MG/ML  SOLN
100.0000 mL | Freq: Once | INTRAMUSCULAR | Status: AC | PRN
  Administered 2019-08-13: 100 mL via INTRAVENOUS

## 2019-08-13 NOTE — ED Notes (Signed)
Patient Alert and oriented to baseline. Stable and ambulatory to baseline. Patient verbalized understanding of the discharge instructions.  Patient belongings were taken by the patient.   

## 2019-08-13 NOTE — Discharge Instructions (Addendum)
You were seen today for chest pain.  Your work-up was reassuring including your chest x-ray, EKG, lab work and heart enzymes.  We think your pain is mostly coming from your left shoulder arthritis.  We have referred you to orthopedics.  They will do further work-up and treatment for you.  If you have any new or worsening symptoms you can return for reevaluation in the emergency department

## 2019-08-13 NOTE — Progress Notes (Signed)
Subjective:     Megan Good is a 69 y.o. female and is here for a comprehensive physical exam. The patient reports problems - stress, L shoulder pain, back pain.  Pt notes increased stress as she found out a family member died this morning.  Pt endorses tension in muscles on neck and shoulders.  Pt gets skin lesions when she is stressed.  The lesions are pustules that rupture and turn into a small scab on hands and R leg.  Pt also with continued L shoulder pain.  Inquires about steroid injection.  Pt just had a steroid taper on 2/17.   Pt inquires about an EKG as she had h/o palpitationst.  Not currently having palpitations.  Social History   Socioeconomic History  . Marital status: Divorced    Spouse name: Not on file  . Number of children: 1  . Years of education: Not on file  . Highest education level: Some college, no degree  Occupational History  . 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 Smoking status: Never Smoker  . Smokeless tobacco: Never Used  Substance and Sexual Activity  . Alcohol use: Never  . Drug use: Not on file  . Sexual activity: Not on file  Other Topics Concern  . Not on file  Social History Narrative   Patient is right-handed. She lives alone in a 2 story house. She occasionally drinks coffee on the weekends. She walks 2-4 miles per day weather permitting. Recently she has been unable to walk dues to headaches.   Social Determinants of Health   Financial Resource Strain:   . Difficulty of Paying Living Expenses: Not on file  Food Insecurity:   . Worried About Charity fundraiser in the Last Year: Not on file  . Ran Out of Food in the Last Year: Not on file  Transportation Needs:   . Lack of Transportation (Medical): Not on file  . Lack of Transportation (Non-Medical): Not on file  Physical Activity:   . Days of Exercise per Week: Not on file  . Minutes of Exercise per Session: Not on file  Stress:    . Feeling of Stress : Not on file  Social Connections:   . Frequency of Communication with Friends and Family: Not on file  . Frequency of Social Gatherings with Friends and Family: Not on file  . Attends Religious Services: Not on file  . Active Member of Clubs or Organizations: Not on file  . Attends Archivist Meetings: Not on file  . Marital Status: Not on file  Intimate Partner Violence:   . Fear of Current or Ex-Partner: Not on file  . Emotionally Abused: Not on file  . Physically Abused: Not on file  . Sexually Abused: Not on file   Health Maintenance  Topic Date Due  . Hepatitis C Screening  26-Dec-1950  . TETANUS/TDAP  08/18/1968  . COLONOSCOPY  08/19/1999  . MAMMOGRAM  02/23/2014  . PNA vac Low Risk Adult (2 of 2 - PCV13) 03/11/2020  . INFLUENZA VACCINE  Completed  . DEXA SCAN  Completed    The following portions of the patient's history were reviewed and updated as appropriate: allergies, current medications, past family history, past medical history, past social history, past surgical history and problem list.  Review of Systems Pertinent items noted in HPI and remainder of comprehensive ROS otherwise negative.   Objective:    BP (!) 164/76 (  BP Location: Right Arm, Patient Position: Sitting, Cuff Size: Normal)   Pulse (!) 102   Temp 98.4 F (36.9 C) (Temporal)   Wt 148 lb (67.1 kg)   SpO2 98%   BMI 26.22 kg/m  General appearance: alert, cooperative and no distress Head: Normocephalic, without obvious abnormality, atraumatic Eyes: conjunctivae/corneas clear. PERRL, EOM's intact. Fundi benign. Ears: normal TM's and external ear canals both ears Nose: Nares normal. Septum midline. Mucosa normal. No drainage or sinus tenderness. Throat: lips, mucosa, and tongue normal; teeth and gums normal Neck: no adenopathy, no carotid bruit, no JVD, supple, symmetrical, trachea midline and thyroid not enlarged, symmetric, no tenderness/mass/nodules Back:  symmetric, no curvature. ROM normal. No CVA tenderness., L trapezius tense with mild edema. Lungs: clear to auscultation bilaterally Breasts: normal appearance, no masses or tenderness, No nipple retraction or dimpling, No axillary or supraclavicular adenopathy, Normal to palpation without dominant masses, TTP of L upper chest and L breast 6 o'clock position, TTP of R breast 10 o'clock position Heart: regular rate and rhythm, S1, S2 normal, no murmur, click, rub or gallop Abdomen: soft, non-tender; bowel sounds normal; no masses,  no organomegaly Extremities: extremities normal, atraumatic, no cyanosis or edema TTP of L shoulder at distal L clavicle, AC joint, and scapula.  TTP of L upper chest. Pulses: 2+ and symmetric Skin: warm, dry, intact.  Dorsum of L hand with 2-3 areas with 2 mm eschar.  similar lesion on dorsum of R hand R 2nd digit. Lymph nodes: Cervical, supraclavicular, and axillary nodes normal. Neurologic: Alert and oriented X 3, normal strength and tone. Normal symmetric reflexes. Normal coordination and gait    Assessment:    Healthy female exam with several MSK complaints, breast tenderness, anxiety, and skin lesions.     Plan:     Anticipatory guidance given including wearing seatbelts, smoke detectors in the home, increasing physical activity, increasing p.o. intake of water and vegetables. -will obtain labs -discussed mammogram -Colonoscopy and pap advised. -given handout -next CPE in 1 yr See After Visit Summary for Counseling Recommendations    Stress -Increased 2/2 recent loss of loved one -Discussed ways to reduce stress and deal with grief  Screening for cholesterol level -Continue lifestyle modifications - Plan: Lipid panel  Palpitations  -Unable to obtain EKG clinic as out of adhesive pads.  Pt declined waiting. - Plan: CBC with Differential/Platelet, Basic metabolic panel, TSH, T4, Free, Hemoglobin A1c, EKG 12-Lead  Tension -likely 2/2 increased  stress -discussed ways to relieve stress. -Discussed stretching exercises -Supportive care  Lumbar back pain -Supportive care - Plan: cyclobenzaprine (FLEXERIL) 5 MG tablet  Chronic left shoulder pain  -s/p prednisone taper 2/17 - Plan: DG Shoulder Left  F/u prn

## 2019-08-13 NOTE — ED Notes (Signed)
Neale Burly (sister) [phone] Would like to be called with any updates

## 2019-08-13 NOTE — ED Provider Notes (Signed)
[facility] EMERGENCY DEPARTMENT Provider Note   CSN: [id number] Arrival date & time: 08/12/19  1941     History Chief Complaint  Patient presents with  . Chest Pain    Megan Good is a 69 y.o. female.  Patient is a 69 year old female with no significant past medical history presenting to the emergency department for left-sided chest pain and shoulder pain.  Patient reports that she has been treated for arthritis in her left shoulder for several months now.  However, she reports that she is having pain radiating into her chest now for about 1 month.  The pain seems to be worse with getting up and moving her arm around especially while walking with a cart at the grocery store or raising her arms up to dust at home. Reports she is able to walk one mile per day and not have any pain at all, only if she were to raise her shoulder.   Is associated with palpitations and occasional shortness of breath.  Currently she is not feeling any symptoms while she is at rest.  No significant cardiac history.  No family cardiac history.  Denies any smoking.  Denies any history of blood clots.        Past Medical History:  Diagnosis Date  . Headache     There are no problems to display for this patient.   Past Surgical History:  Procedure Laterality Date  . TONSILLECTOMY       OB History   No obstetric history on file.     Family History  Problem Relation Age of Onset  . Healthy Mother 73  . Other Father        MVA    Social History   Tobacco Use  . Smoking status: Never Smoker  . Smokeless tobacco: Never Used  Substance Use Topics  . Alcohol use: Never  . Drug use: Not on file    Home Medications Prior to Admission medications   Medication Sig Start Date End Date Taking? Authorizing Provider  cyclobenzaprine (FLEXERIL) 5 MG tablet Take 1 tablet (5 mg total) by mouth 3 (three) times daily as needed for muscle spasms. 08/11/19  Yes Deeann Saint, MD   ibuprofen (ADVIL) 800 MG tablet Take 1 tablet (800 mg total) by mouth every 8 (eight) hours as needed. Patient not taking: Reported on 08/13/2019 10/20/18   Deeann Saint, MD  pantoprazole (PROTONIX) 20 MG tablet Take 1 tablet (20 mg total) by mouth daily. Patient not taking: Reported on 08/13/2019 06/12/18   Deeann Saint, MD  predniSONE (DELTASONE) 10 MG tablet Take 4 tabs every morning for 3 days, 3 tabs for 2 days, 2 tabs for 2 days, 1 tab for 1 day. Patient not taking: Reported on 08/13/2019 07/28/19   Deeann Saint, MD    Allergies    Penicillins  Review of Systems   Review of Systems  Constitutional: Negative for chills and fever.  HENT: Negative for congestion, ear pain and sore throat.   Eyes: Negative for pain and visual disturbance.  Respiratory: Positive for shortness of breath. Negative for cough.   Cardiovascular: Positive for chest pain and palpitations. Negative for leg swelling.  Gastrointestinal: Negative for abdominal pain, nausea and vomiting.  Genitourinary: Negative for dysuria and hematuria.  Musculoskeletal: Positive for arthralgias. Negative for back pain.  Skin: Negative for color change and rash.  Neurological: Negative for dizziness, seizures, syncope, light-headedness and headaches.  All other systems reviewed and  are negative.   Physical Exam Updated Vital Signs BP (!) 161/115   Pulse 67   Temp 98 F (36.7 C) (Oral)   Resp 17   Ht 5\' 4"  (1.626 m)   Wt 67.1 kg   SpO2 100%   BMI 25.40 kg/m   Physical Exam Vitals and nursing note reviewed.  Constitutional:      General: She is not in acute distress.    Appearance: She is well-developed. She is not ill-appearing, toxic-appearing or diaphoretic.  HENT:     Head: Normocephalic.  Eyes:     Pupils: Pupils are equal, round, and reactive to light.  Cardiovascular:     Rate and Rhythm: Normal rate and regular rhythm.     Heart sounds: Normal heart sounds.  Pulmonary:     Effort: Pulmonary effort  is normal.     Breath sounds: Normal breath sounds. No decreased breath sounds.  Chest:     Chest wall: No mass, deformity or tenderness.  Musculoskeletal:     Cervical back: Neck supple.     Right lower leg: No edema.     Left lower leg: No edema.  Skin:    General: Skin is warm.     Capillary Refill: Capillary refill takes less than 2 seconds.  Neurological:     Mental Status: She is alert.  Psychiatric:        Mood and Affect: Mood normal.     ED Results / Procedures / Treatments   Labs (all labs ordered are listed, but only abnormal results are displayed) Labs Reviewed  BASIC METABOLIC PANEL - Abnormal; Notable for the following components:      Result Value   Glucose, Bld 104 (*)    All other components within normal limits  D-DIMER, QUANTITATIVE (NOT AT [facility]) - Abnormal; Notable for the following components:   D-Dimer, Quant 1.01 (*)    All other components within normal limits  CBC  TROPONIN I (HIGH SENSITIVITY)  TROPONIN I (HIGH SENSITIVITY)    EKG EKG Interpretation  Date/Time:  Thursday August 12 2019 19:54:37 EST Ventricular Rate:  102 PR Interval:  132 QRS Duration: 70 QT Interval:  316 QTC Calculation: 411 R Axis:   68 Text Interpretation: Sinus tachycardia Otherwise normal ECG Confirmed by [id number] (Ross Marcus) on 08/13/2019 8:00:05 AM   Radiology DG Chest 2 View  Result Date: 08/12/2019 CLINICAL DATA:  Chest pain EXAM: CHEST - 2 VIEW COMPARISON:  02/25/2018 FINDINGS: The heart size and mediastinal contours are within normal limits. Aortic atherosclerosis. Both lungs are clear. The visualized skeletal structures are unremarkable. IMPRESSION: No acute cardiopulmonary abnormalities. Electronically Signed   By: 02/27/2018 M.D.   On: 08/12/2019 20:58   CT Angio Chest PE W and/or Wo Contrast  Result Date: 08/13/2019 CLINICAL DATA:  Shortness of breath EXAM: CT ANGIOGRAPHY CHEST WITH CONTRAST TECHNIQUE: Multidetector CT imaging of the chest was  performed using the standard protocol during bolus administration of intravenous contrast. Multiplanar CT image reconstructions and MIPs were obtained to evaluate the vascular anatomy. CONTRAST:  10/13/2019 OMNIPAQUE IOHEXOL 300 MG/ML  SOLN COMPARISON:  02/15/2018 FINDINGS: Cardiovascular: Limited assessment of the thoracic aorta due to timing of this evaluation for assessment of pulmonary vasculature. Signs of calcified and noncalcified plaque in the thoracic aorta. No signs of aneurysmal dilation. Signs of scattered coronary artery calcification in left main and LAD. Heart size is normal without pericardial effusion. No signs of pulmonary embolism. Mediastinum/Nodes: No signs of axillary, thoracic inlet, mediastinal  or hilar lymphadenopathy. Esophagus grossly normal. Lungs/Pleura: Very mild biapical scarring. No signs of dense consolidation or evidence of pleural effusion. No suspicious mass or nodule. Airways are patent. Upper Abdomen: Limited assessment of upper abdominal contents without acute process. Early nature of the bolus shows limited enhancement of abdominal viscera as expected based on the bolus timing. Musculoskeletal: No signs of acute bone finding or destructive bone process. Review of the MIP images confirms the above findings. IMPRESSION: 1. No evidence of pulmonary embolism. 2. Limited assessment of the aorta as described 3. Scattered coronary artery calcifications in the left main and LAD. Aortic Atherosclerosis (ICD10-I70.0). Electronically Signed   By: Zetta Bills M.D.   On: 08/13/2019 10:11   DG Shoulder Left  Result Date: 08/11/2019 CLINICAL DATA:  Left shoulder pain. History of arthritis. EXAM: LEFT SHOULDER - 2+ VIEW COMPARISON:  None. FINDINGS: There is no acute displaced fracture or dislocation. Mild left-sided glenohumeral osteoarthritis is noted. There is mild left acromioclavicular osteoarthritis. IMPRESSION: No acute displaced fracture or dislocation. Mild left shoulder  osteoarthritis. Electronically Signed   By: Constance Holster M.D.   On: 08/11/2019 23:56    Procedures Procedures (including critical care time)  Medications Ordered in ED Medications  sodium chloride flush (NS) 0.9 % injection 3 mL (3 mLs Intravenous Given 08/13/19 0724)  iohexol (OMNIPAQUE) 300 MG/ML solution 100 mL (100 mLs Intravenous Contrast Given 08/13/19 1002)    ED Course  I have reviewed the triage vital signs and the nursing notes.  Pertinent labs & imaging results that were available during my care of the patient were reviewed by me and considered in my medical decision making (see chart for details).  Clinical Course as of Aug 12 1028  Fri Aug 13, 2019  0730 Patient with heart score of 4 here for intermittent L sided chest pain for several days associated with palpitations, worse with arm movements and influenced by severe arthritis in the L shoulder. Bps have been elevated throughout her stay, she reports due to stress, never needed bp meds before. Currently no symptoms at rest. Trop 5 and 8 respectively. Tachycardic on arrival and so ddimer was ordered which was positive so CTA was performed.    [KM]  1018 No PE on CTA. Chest pain sounds musculoskeletal referred pain from L shoulder. Dicussed with Dr. Rex Kras who also examined the patient and agrees with ortho and PMD f/u   [KM]    Clinical Course User Index [KM] Kristine Royal   MDM Rules/Calculators/A&P                      Based on review of vitals, medical screening exam, lab work and/or imaging, there does not appear to be an acute, emergent etiology for the patient's symptoms. Counseled pt on good return precautions and encouraged both PCP and ED follow-up as needed.  Prior to discharge, I also discussed incidental imaging findings with patient in detail and advised appropriate, recommended follow-up in detail.  Clinical Impression: 1. Chest pain, unspecified type   2. Acute pain of left shoulder      Disposition: Discharge  Prior to providing a prescription for a controlled substance, I independently reviewed the patient's recent prescription history on the [place]. The patient had no recent or regular prescriptions and was deemed appropriate for a brief, less than 3 day prescription of narcotic for acute analgesia.  This note was prepared with assistance of Systems analyst.  Occasional wrong-word or sound-a-like substitutions may have occurred due to the inherent limitations of voice recognition software.  Final Clinical Impression(s) / ED Diagnoses Final diagnoses:  Chest pain, unspecified type  Acute pain of left shoulder    Rx / DC Orders ED Discharge Orders    None       Jeral Pinch 08/13/19 1030    Little, Ambrose Finland, MD 08/17/19 1011

## 2019-08-16 ENCOUNTER — Other Ambulatory Visit: Payer: Self-pay

## 2019-08-16 DIAGNOSIS — E7800 Pure hypercholesterolemia, unspecified: Secondary | ICD-10-CM

## 2019-08-16 DIAGNOSIS — R002 Palpitations: Secondary | ICD-10-CM

## 2019-08-23 ENCOUNTER — Telehealth: Payer: Self-pay | Admitting: Family Medicine

## 2019-08-23 ENCOUNTER — Other Ambulatory Visit: Payer: Self-pay | Admitting: Family Medicine

## 2019-08-23 DIAGNOSIS — Z1231 Encounter for screening mammogram for malignant neoplasm of breast: Secondary | ICD-10-CM

## 2019-08-23 NOTE — Telephone Encounter (Signed)
Pt is also requesting a referral for [facility].

## 2019-08-23 NOTE — Telephone Encounter (Addendum)
The referral that was made on 3/3 they do not accept [facility] and the pt is needing a new Referral sent but is not sure where it can go.    Pt can be reached at [phone] or [phone]    This note is not being shared with the patient for the following reason: To respect privacy (The patient or proxy has requested that the information not be shared).

## 2019-08-24 ENCOUNTER — Other Ambulatory Visit: Payer: Self-pay | Admitting: Family Medicine

## 2019-08-24 ENCOUNTER — Other Ambulatory Visit: Payer: Self-pay

## 2019-08-24 DIAGNOSIS — G8929 Other chronic pain: Secondary | ICD-10-CM

## 2019-08-24 DIAGNOSIS — M545 Low back pain, unspecified: Secondary | ICD-10-CM

## 2019-08-24 DIAGNOSIS — Z1231 Encounter for screening mammogram for malignant neoplasm of breast: Secondary | ICD-10-CM

## 2019-08-24 DIAGNOSIS — N644 Mastodynia: Secondary | ICD-10-CM

## 2019-08-24 DIAGNOSIS — M25512 Pain in left shoulder: Secondary | ICD-10-CM

## 2019-08-25 NOTE — Telephone Encounter (Signed)
Pt referral placed pt is aware to contact [place] [facility] for appointment and to wait for a call from the Orthopedics to schedule

## 2019-08-30 ENCOUNTER — Telehealth: Payer: Self-pay | Admitting: Family Medicine

## 2019-08-30 ENCOUNTER — Other Ambulatory Visit: Payer: Self-pay | Admitting: Family Medicine

## 2019-08-30 DIAGNOSIS — N644 Mastodynia: Secondary | ICD-10-CM

## 2019-08-30 NOTE — Telephone Encounter (Signed)
Pt states that she has not been taking the Flexeril for her shoulder pain, pt state that she has noticed her heart racing and palpitations when she takes it, requests an alternative muscle relaxer, pt has an appointment with orthopedics on 09/29/19 at 10.30 am. Pt also had a question regarding her mammogram app advised to call [facility] for scheduling. Please advise

## 2019-08-30 NOTE — Telephone Encounter (Signed)
Ok to stop muscle relaxer if causing heart to feel like it is racing.  Ok for mammogram.

## 2019-08-30 NOTE — Telephone Encounter (Signed)
Pt call [facility] needs clearance from provider to have her mammogram done due to pt previously having chest pain.

## 2019-08-30 NOTE — Telephone Encounter (Signed)
Pt informed that she has a thyroid problem and should not take muscle relaxer with that issue. She wanted to know if something else can be prescribed to her instead.

## 2019-08-31 NOTE — Telephone Encounter (Signed)
Spoke with pt state that she stopped taking the flexeril after experiencing her heart racing

## 2019-09-01 ENCOUNTER — Other Ambulatory Visit: Payer: Self-pay

## 2019-09-01 ENCOUNTER — Ambulatory Visit: Payer: Medicare PPO | Attending: Internal Medicine

## 2019-09-01 DIAGNOSIS — Z23 Encounter for immunization: Secondary | ICD-10-CM

## 2019-09-01 NOTE — Progress Notes (Signed)
   Covid-19 Vaccination Clinic  Name:  Jolette Lana    MRN: [id number] DOB: March 06, 1951  09/01/2019  Ms. Sundell was observed post Covid-19 immunization for 15 minutes without incident. She was provided with Vaccine Information Sheet and instruction to access the V-Safe system.   Ms. Toomey was instructed to call 911 with any severe reactions post vaccine: Marland Kitchen Difficulty breathing  . Swelling of face and throat  . A fast heartbeat  . A bad rash all over body  . Dizziness and weakness   Immunizations Administered    Name Date Dose VIS Date Route   Pfizer COVID-19 Vaccine 09/01/2019 10:12 AM 0.3 mL 05/21/2019 Intramuscular   Manufacturer: [employer], [employer]   Lot: [id number]   NDC: [address]-0

## 2019-09-02 ENCOUNTER — Encounter: Payer: Self-pay | Admitting: Family Medicine

## 2019-09-02 ENCOUNTER — Ambulatory Visit (INDEPENDENT_AMBULATORY_CARE_PROVIDER_SITE_OTHER): Payer: Medicare PPO | Admitting: Family Medicine

## 2019-09-02 VITALS — BP 142/78 | HR 96 | Temp 97.6°F | Wt 145.0 lb

## 2019-09-02 DIAGNOSIS — M5410 Radiculopathy, site unspecified: Secondary | ICD-10-CM | POA: Insufficient documentation

## 2019-09-02 DIAGNOSIS — M19012 Primary osteoarthritis, left shoulder: Secondary | ICD-10-CM

## 2019-09-02 LAB — VITAMIN D 25 HYDROXY (VIT D DEFICIENCY, FRACTURES): VITD: 43.22 ng/mL (ref 30.00–100.00)

## 2019-09-02 LAB — FOLATE: Folate: >23.7 ng/mL (ref >5.9)

## 2019-09-02 LAB — VITAMIN B12: Vitamin B-12: >1500 pg/mL — ABNORMAL HIGH (ref 211–911)

## 2019-09-02 MED ORDER — GABAPENTIN 100 MG PO CAPS: 100.0000 mg | ORAL_CAPSULE | Freq: Every day | ORAL | 1 refills | Status: DC

## 2019-09-02 NOTE — Progress Notes (Signed)
Subjective:    Patient ID: Megan Good, female    DOB: 10-19-1950, 69 y.o.   MRN: [id number]  No chief complaint on file.   HPI Patient was seen today for f/u on numbness and tingling in legs.  Pt began having low back pain and numbness in LEs in 2019 after lifting a heavy lamp.  Pt notes L lateral shin has the sensation of water running over it.   Sensation increases with increased standing or going up stairs.  Pt also with numbness in toes of left and R foot.  Pt notes hearing a crack in her neck over the weekend when pushing up against the washing machine.  Pt states the washing machine was unbalanced and she went to stop it from shaking.  Pt now with numbness in fingers.  Denies UE weakness.   In the past pt had to cancel MRI of lumbar spine 2/2 work schedule.  Pt notes she has f/u with Ortho on 09/29/19 after ED visit on 08/12/19 for L shoulder pain.  Since the ED visit pt has not had any more CP.  Pt notes the pain was 2/2 increased stress.  She is trying to relax and take deep breaths more.  Past Medical History:  Diagnosis Date  . Headache     Allergies  Allergen Reactions  . Penicillins Swelling and Rash    Has patient had a PCN reaction causing immediate rash, facial/tongue/throat swelling, SOB or lightheadedness with hypotension: No Has patient had a PCN reaction causing severe rash involving mucus membranes or skin necrosis: No Has patient had a PCN reaction that required hospitalization: No Has patient had a PCN reaction occurring within the last 10 years: No If all of the above answers are "NO", then may proceed with Cephalosporin use.  Burn like rash    ROS General: Denies fever, chills, night sweats, changes in weight, changes in appetite HEENT: Denies headaches, ear pain, changes in vision, rhinorrhea, sore throat CV: Denies CP, palpitations, SOB, orthopnea Pulm: Denies SOB, cough, wheezing GI: Denies abdominal pain, nausea, vomiting, diarrhea, constipation GU:  Denies dysuria, hematuria, frequency, vaginal discharge Msk: Denies muscle cramps, joint pains Neuro: Denies weakness  +numbness, tingling in UEs and LEs Skin: Denies rashes, bruising Psych: Denies depression, anxiety, hallucinations      Objective:    Blood pressure (!) 142/78, pulse 96, temperature 97.6 F (36.4 C), temperature source Temporal, weight 145 lb (65.8 kg), SpO2 99 %.   Gen. Pleasant, well-nourished, in no distress, normal affect   HEENT: Leonardo/AT, face symmetric, no scleral icterus, PERRLA, EOMI, nares patent without drainage Lungs: no accessory muscle use, CTAB, no wheezes or rales Cardiovascular: RRR, no m/r/g, no peripheral edema Abdomen: BS present, soft, NT/ND, no hepatosplenomegaly. Musculoskeletal: No deformities, no cyanosis or clubbing, normal tone Neuro:  A&Ox3, CN II-XII intact, normal gait Skin:  Warm, no lesions/ rash   Wt Readings from Last 3 Encounters:  09/02/19 145 lb (65.8 kg)  08/12/19 148 lb (67.1 kg)  08/11/19 148 lb (67.1 kg)    Lab Results  Component Value Date   WBC 7.7 08/12/2019   HGB 13.2 08/12/2019   HCT 39.8 08/12/2019   PLT 165 08/12/2019   GLUCOSE 104 (H) 08/12/2019   CHOL 249 (H) 08/11/2019   TRIG 69.0 08/11/2019   HDL 70.40 08/11/2019   LDLCALC 165 (H) 08/11/2019   NA 140 08/12/2019   K 3.8 08/12/2019   CL 108 08/12/2019   CREATININE 0.89 08/12/2019   BUN 11  08/12/2019   CO2 22 08/12/2019   TSH 0.19 (L) 08/11/2019   HGBA1C 5.3 08/11/2019    Assessment/Plan:  Radicular neuropathy   -discussed possible causes including vit def, nerve compression 2/2 bulging disc, degenerative changes, or stenosis, electrolyte derangement--though labs normal from 08/11/19 and 08/12/19, medications -will obtain labs to check vitamins -start gabapentin 100 mg qhs -discussed imaging of lumbar and cervical spine.  Pt wishes to wait until Ortho appt. -f/u with Ortho on 4/21 -given precautions -given handout - Plan: Vitamin B12, Folate,  Vitamin D, 25-hydroxy, gabapentin (NEURONTIN) 100 MG capsule  OA of L shoulder -Xray 08/11/19 with mild L AC OA -continue supportive care -consider steroid injection -f/u with Ortho  F/u prn  Grier Mitts, MD

## 2019-09-02 NOTE — Patient Instructions (Signed)
Radicular Pain Radicular pain is a type of pain that spreads from your back or neck along a spinal nerve. Spinal nerves are nerves that leave the spinal cord and go to the muscles. Radicular pain is sometimes called radiculopathy, radiculitis, or a pinched nerve. When you have this type of pain, you may also have weakness, numbness, or tingling in the area of your body that is supplied by the nerve. The pain may feel sharp and burning. Depending on which spinal nerve is affected, the pain may occur in the:  Neck area (cervical radicular pain). You may also feel pain, numbness, weakness, or tingling in the arms.  Mid-spine area (thoracic radicular pain). You would feel this pain in the back and chest. This type is rare.  Lower back area (lumbar radicular pain). You would feel this pain as low back pain. You may feel pain, numbness, weakness, or tingling in the buttocks or legs. Sciatica is a type of lumbar radicular pain that shoots down the back of the leg. Radicular pain occurs when one of the spinal nerves becomes irritated or squeezed (compressed). It is often caused by something pushing on a spinal nerve, such as one of the bones of the spine (vertebrae) or one of the round cushions between vertebrae (intervertebral disks). This can result from:  An injury.  Wear and tear or aging of a disk.  The growth of a bone spur that pushes on the nerve. Radicular pain often goes away when you follow instructions from your health care provider for relieving pain at home. Follow these instructions at home: Managing pain      If directed, put ice on the affected area: ? Put ice in a plastic bag. ? Place a towel between your skin and the bag. ? Leave the ice on for 20 minutes, 2-3 times a day.  If directed, apply heat to the affected area as often as told by your health care provider. Use the heat source that your health care provider recommends, such as a moist heat pack or a heating pad. ? Place  a towel between your skin and the heat source. ? Leave the heat on for 20-30 minutes. ? Remove the heat if your skin turns bright red. This is especially important if you are unable to feel pain, heat, or cold. You may have a greater risk of getting burned. Activity   Do not sit or rest in bed for long periods of time.  Try to stay as active as possible. Ask your health care provider what type of exercise or activity is best for you.  Avoid activities that make your pain worse, such as bending and lifting.  Do not lift anything that is heavier than 10 lb (4.5 kg), or the limit that you are told, until your health care provider says that it is safe.  Practice using proper technique when lifting items. Proper lifting technique involves bending your knees and rising up.  Do strength and range-of-motion exercises only as told by your health care provider or physical therapist. General instructions  Take over-the-counter and prescription medicines only as told by your health care provider.  Pay attention to any changes in your symptoms.  Keep all follow-up visits as told by your health care provider. This is important. ? Your health care provider may send you to a physical therapist to help with this pain. Contact a health care provider if:  Your pain and other symptoms get worse.  Your pain medicine is not   helping.  Your pain has not improved after a few weeks of home care.  You have a fever. Get help right away if:  You have severe pain, weakness, or numbness.  You have difficulty with bladder or bowel control. Summary  Radicular pain is a type of pain that spreads from your back or neck along a spinal nerve.  When you have radicular pain, you may also have weakness, numbness, or tingling in the area of your body that is supplied by the nerve.  The pain may feel sharp or burning.  Radicular pain may be treated with ice, heat, medicines, or physical therapy. This  information is not intended to replace advice given to you by your health care provider. Make sure you discuss any questions you have with your health care provider. Document Revised: 12/09/2017 Document Reviewed: 12/09/2017 Elsevier Patient Education  2020 [employer]  

## 2019-09-03 NOTE — Telephone Encounter (Signed)
FYI Spoke with pt state that she wanted to know if its ok to take gabapentin with the OTC Ecotrin, pt states that she took both medications and didn't feel good, pt advised to stop taking OTC Ecotrin and to take Gabapentin which was  prescribed at her last visit.

## 2019-09-03 NOTE — Telephone Encounter (Signed)
Pt is wondering if her B12 is elevated because she is taking B12 vitamins or would that not effect it? She stated she started taking a larger dose of it since being told she had a deficiency

## 2019-09-03 NOTE — Telephone Encounter (Signed)
Pt is calling in stating that she was prescribed gabapentin on 09/02/2019 and is taking ecotrin and would like to know if it is okay for her to take both of them in the same day b/c she is still in a lot of pain.  Pt would like to have a call back today to let her know.

## 2019-09-06 NOTE — Telephone Encounter (Signed)
Yes, it is ok to take the gabapentin and the enteric coated ASA.  Yes, the B12 supplement pt is taking is what made her B12 elevated.

## 2019-09-06 NOTE — Telephone Encounter (Signed)
Spoke with pt verbalized understanding of Dr Banks advise 

## 2019-09-20 ENCOUNTER — Other Ambulatory Visit: Payer: Self-pay

## 2019-09-21 ENCOUNTER — Other Ambulatory Visit (INDEPENDENT_AMBULATORY_CARE_PROVIDER_SITE_OTHER): Payer: Medicare PPO

## 2019-09-21 DIAGNOSIS — R002 Palpitations: Secondary | ICD-10-CM

## 2019-09-21 LAB — TSH: TSH: 0.51 u[IU]/mL (ref 0.35–4.50)

## 2019-09-21 LAB — T4, FREE: Free T4: 1.03 ng/dL (ref 0.60–1.60)

## 2019-09-22 ENCOUNTER — Other Ambulatory Visit: Payer: Self-pay

## 2019-09-22 DIAGNOSIS — Z13220 Encounter for screening for lipoid disorders: Secondary | ICD-10-CM

## 2019-09-29 ENCOUNTER — Ambulatory Visit: Payer: Medicare PPO | Admitting: Orthopedic Surgery

## 2019-09-29 ENCOUNTER — Other Ambulatory Visit: Payer: Medicare PPO

## 2019-09-29 ENCOUNTER — Ambulatory Visit (INDEPENDENT_AMBULATORY_CARE_PROVIDER_SITE_OTHER): Payer: Medicare PPO

## 2019-09-29 ENCOUNTER — Encounter: Payer: Self-pay | Admitting: Orthopedic Surgery

## 2019-09-29 ENCOUNTER — Other Ambulatory Visit: Payer: Self-pay

## 2019-09-29 DIAGNOSIS — M7552 Bursitis of left shoulder: Secondary | ICD-10-CM | POA: Diagnosis not present

## 2019-09-29 DIAGNOSIS — M542 Cervicalgia: Secondary | ICD-10-CM | POA: Diagnosis not present

## 2019-09-29 DIAGNOSIS — M25512 Pain in left shoulder: Secondary | ICD-10-CM

## 2019-09-30 DIAGNOSIS — M7552 Bursitis of left shoulder: Secondary | ICD-10-CM

## 2019-09-30 DIAGNOSIS — M542 Cervicalgia: Secondary | ICD-10-CM | POA: Diagnosis not present

## 2019-09-30 MED ORDER — BUPIVACAINE HCL 0.5 % IJ SOLN
9.0000 mL | INTRAMUSCULAR | Status: AC | PRN
  Administered 2019-09-30: 9 mL via INTRA_ARTICULAR

## 2019-09-30 MED ORDER — METHYLPREDNISOLONE ACETATE 40 MG/ML IJ SUSP
40.0000 mg | INTRAMUSCULAR | Status: AC | PRN
  Administered 2019-09-30: 40 mg via INTRA_ARTICULAR

## 2019-09-30 MED ORDER — LIDOCAINE HCL 1 % IJ SOLN
5.0000 mL | INTRAMUSCULAR | Status: AC | PRN
  Administered 2019-09-30: 5 mL

## 2019-09-30 NOTE — Progress Notes (Signed)
Office Visit Note   Patient: Megan Good           Date of Birth: 05/01/1951           MRN: [id number] Visit Date: 09/29/2019 Requested by: Deeann Saint, MD [address] [place],  [place] [address] PCP: Deeann Saint, MD  Subjective: Chief Complaint  Patient presents with  . Left Shoulder - Pain    HPI: Megan Good is a 69 year old patient with left shoulder and left neck pain.  Is been going on for 6 months in the left shoulder.  She is unsure of a discrete injury.  She is right-hand dominant.  Denies any radicular arm pain.  Does report some numbness and tingling in her hand.  She states she has neck pain left greater than right.  The pain does not wake her from sleep at night.  Radiographs of the left shoulder are on the system which showed not much in terms of significant arthritis.  She also describes primarily diffuse type pain in that shoulder girdle both anteriorly and posteriorly.  Not much in terms of symptoms below the elbow.  Patient also describes left leg pain with walking.  Localizes an area of tenderness about 4 fingerbreadths above the lateral malleolus.  This only hurts her when she is walking.  Her foot does go numb but that improves when she sits down.  States that her symptoms started about 6 months ago in association with an event where she was picking up a 40 pound lamp.  Gabapentin helps her symptoms.  Localizes pain to the biceps region as well.  Old notes are reviewed.  She had a cervical spine MRI in 2011 which showed C4-5 disc bulge as well as C5-6 and C6-7 moderate foraminal narrowing.              ROS: All systems reviewed are negative as they relate to the chief complaint within the history of present illness.  Patient denies  fevers or chills.   Assessment & Plan: Visit Diagnoses:  1. Cervicalgia   2. Left shoulder pain, unspecified chronicity     Plan: Impression is left shoulder and neck pain.  I think this could be either SLAP tear or  small cuff tear versus bursitis.  She is having some radiating pain into the biceps region indicating possible degenerative labral pathology.  No acute instability.  I think this also could be cervical spine pathology based on the episodic numbness and tingling in her hand on the left-hand side.  All of the symptoms have been going on for over 6 months.  She has failed conservative management.  She has been doing some home exercise stretching without success.  She appears to be a very physically fit person.  Regarding the left leg pain I think she may have superficial peroneal nerve compression where it exits from the fascia.  She is focally tender at this location.  Does describe tenderness in this region discretely.  No evidence of chronic or exertional compartment syndrome on exam today.  See her back after MRI scan on both neck and shoulder.  Follow-Up Instructions: Return for after MRI.   Orders:  Orders Placed This Encounter  Procedures  . XR Cervical Spine 2 or 3 views  . MR Shoulder Left w/ contrast  . Arthrogram  . MR Cervical Spine w/o contrast   No orders of the defined types were placed in this encounter.     Procedures: Large Joint Inj:  L subacromial bursa on 09/30/2019 8:09 AM Indications: diagnostic evaluation and pain Details: 18 G 1.5 in needle, posterior approach  Arthrogram: No  Medications: 9 mL bupivacaine 0.5 %; 40 mg methylPREDNISolone acetate 40 MG/ML; 5 mL lidocaine 1 % Outcome: tolerated well, no immediate complications Procedure, treatment alternatives, risks and benefits explained, specific risks discussed. Consent was given by the patient. Immediately prior to procedure a time out was called to verify the correct patient, procedure, equipment, support staff and site/side marked as required. Patient was prepped and draped in the usual sterile fashion.       Clinical Data: No additional findings.  Objective: Vital Signs: There were no vitals taken for  this visit.  Physical Exam:   Constitutional: Patient appears well-developed HEENT:  Head: Normocephalic Eyes:EOM are normal Neck: Normal range of motion Cardiovascular: Normal rate Pulmonary/chest: Effort normal Neurologic: Patient is alert Skin: Skin is warm Psychiatric: Patient has normal mood and affect    Ortho Exam: Ortho exam of the lower extremities demonstrates good ankle dorsiflexion plantarflexion strength.  She does have tenderness to palpation about 9 cm above the lateral malleolus on the left-hand side.  Tinel's is positive in this region for reproduction of symptoms to the dorsal foot.  Compartments are soft.  No groin pain with internal X rotation of either leg and no nerve root tension signs.  Upper extremity is examined.  She has 5 out of 5 grip EPL FPL interosseous wrist extra extension bicep triceps and deltoid strength with no paresthesias in the C5-C6-C7 distribution.  Mild paresthesias in the C8 distribution on the left-hand side.  Negative Tinel's cubital tunnel in the elbow with no subluxation of the ulnar nerve.  Left shoulder demonstrates full active and passive range of motion with no restriction of external rotation of 15 degrees of abduction.  She still little coarseness with internal/external rotation at 90 degrees of abduction on the left which is not present on the right.  No discrete AC joint tenderness is present.  O'Brien's testing equivocal on the left negative on the right.  Negative speeds testing.  No Popeye deformity noted in either arm.  No other masses lymphadenopathy or skin changes noted in that shoulder girdle region.  Specialty Comments:  No specialty comments available.  Imaging: XR Cervical Spine 2 or 3 views  Result Date: 09/30/2019 AP lateral cervical spine reviewed.  There is loss of lordosis.  Mild to moderate diffuse degenerative disc disease throughout the cervical spine is present with mild loss of disc space and mild to moderate facet  arthritis present.  No acute fracture.  No spinal listhesis.    PMFS History: Patient Active Problem List   Diagnosis Date Noted  . Radicular neuropathy 09/02/2019   Past Medical History:  Diagnosis Date  . Headache     Family History  Problem Relation Age of Onset  . Healthy Mother 76  . Other Father        MVA    Past Surgical History:  Procedure Laterality Date  . TONSILLECTOMY     Social History   Occupational History  . 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 Smoking status: Never Smoker  . Smokeless tobacco: Never Used  Substance and Sexual Activity  . Alcohol use: Never  . Drug use: Not on file  . Sexual activity: Not on file

## 2019-10-13 ENCOUNTER — Ambulatory Visit: Payer: Medicare PPO

## 2019-10-13 ENCOUNTER — Other Ambulatory Visit: Payer: Self-pay

## 2019-10-13 ENCOUNTER — Ambulatory Visit
Admission: RE | Admit: 2019-10-13 | Discharge: 2019-10-13 | Disposition: A | Discharge status: Home / Self Care | Payer: Medicare PPO | Source: Ambulatory Visit | Attending: Family Medicine | Admitting: Family Medicine

## 2019-10-13 DIAGNOSIS — R922 Inconclusive mammogram: Secondary | ICD-10-CM | POA: Diagnosis not present

## 2019-10-13 DIAGNOSIS — N644 Mastodynia: Secondary | ICD-10-CM

## 2019-10-18 ENCOUNTER — Telehealth: Payer: Self-pay | Admitting: Family Medicine

## 2019-10-18 NOTE — Telephone Encounter (Signed)
Pt has a referral to have a MRI and has an appt. Per pt, the referral does not state that she needs to have a MRI for a knot on ankle. Thanks

## 2019-10-19 NOTE — Telephone Encounter (Signed)
Spoke with pt advised to call his Orthopedic Dr office for advise about her MRI concerns since the office placed pt referrals

## 2019-10-20 ENCOUNTER — Telehealth: Payer: Self-pay | Admitting: Orthopedic Surgery

## 2019-10-20 NOTE — Telephone Encounter (Signed)
No

## 2019-10-20 NOTE — Telephone Encounter (Signed)
Please advise. Patient had seen you in April and is scheduled for cervical and shoulder MRI's she is requesting MRI for her left leg.

## 2019-10-20 NOTE — Telephone Encounter (Signed)
Patient called and requesting to need another body part MRI'ed. Left leg was dicussed also with Dr. August Saucer. Please give patient called back. Patient asking for additional referral for left leg MRI. Patient phone number is [phone].

## 2019-10-21 NOTE — Telephone Encounter (Signed)
Tried calling to advise. No answer. LM to return call to discuss per Dr August Saucer

## 2019-10-25 ENCOUNTER — Telehealth: Payer: Self-pay | Admitting: Family Medicine

## 2019-10-25 ENCOUNTER — Telehealth: Payer: Self-pay | Admitting: Orthopedic Surgery

## 2019-10-25 NOTE — Telephone Encounter (Signed)
See below and last OV note.  Patient states she needs an MRI of her ankle as well. She said she discussed the pain she was having at last OV and feels this need to be looked into further. She complains of continued pain and a knot that she feels/sees with ambulation.

## 2019-10-25 NOTE — Telephone Encounter (Signed)
Pt would like to speak to you regarding the results of her mammogram. Pt has breast tissue density and was told to call and speak to her primary physician. Thanks

## 2019-10-25 NOTE — Telephone Encounter (Signed)
Reviewed results with patient but state that she just wants for Dr Salomon Fick to clarify if she should be worried about having any risk for breast cancer since her  mammogram showing that her breast tissue is heterogeneously dense. Please advise

## 2019-10-25 NOTE — Telephone Encounter (Signed)
Patient called requesting a call back from Kathryne Gin. I seen the notes from Dr. August Saucer and explained at this moment her left leg would have to be discussed with clinical staff. Please give patient a call back for discuss of why she can not have MRI on eft leg at this time. Patient phone number is [phone].

## 2019-10-26 ENCOUNTER — Other Ambulatory Visit: Payer: Self-pay | Admitting: Family Medicine

## 2019-10-26 DIAGNOSIS — M5410 Radiculopathy, site unspecified: Secondary | ICD-10-CM

## 2019-10-26 MED ORDER — GABAPENTIN 100 MG PO CAPS: 100.0000 mg | ORAL_CAPSULE | Freq: Every day | ORAL | 1 refills | Status: DC

## 2019-10-26 NOTE — Telephone Encounter (Signed)
Noted  

## 2019-10-26 NOTE — Telephone Encounter (Signed)
Pt called about mammogram results.

## 2019-10-26 NOTE — Telephone Encounter (Signed)
S/w patient. Advised per Dr August Saucer Scheduled her to see Dr August Saucer next week.

## 2019-10-26 NOTE — Progress Notes (Signed)
Pt called regarding question on mammogram results.  Questions answered satisfaction.  Patient states she is doing well overall.  Patient requesting refill on gabapentin.  States it is helping.  Gabapentin refilled.  Abbe Amsterdam, MD

## 2019-10-26 NOTE — Telephone Encounter (Signed)
The ankle issue is superficial peroneal nerve compression.  You could set her up with a nerve study with Dr. Alvester Morin to evaluate that and we could look at it with ultrasound but MRI not indicated for that problem.  I would look at that with ultrasound first and then we need plain x-rays first on that leg and if the x-rays are negative and if the ultrasound is negative and if the nerve study is negative then and only then would we get an MRI scan.

## 2019-11-03 ENCOUNTER — Other Ambulatory Visit: Payer: Medicare PPO

## 2019-11-03 ENCOUNTER — Ambulatory Visit (INDEPENDENT_AMBULATORY_CARE_PROVIDER_SITE_OTHER): Payer: Medicare PPO

## 2019-11-03 ENCOUNTER — Ambulatory Visit: Payer: Medicare PPO | Admitting: Orthopedic Surgery

## 2019-11-03 ENCOUNTER — Encounter: Payer: Self-pay | Admitting: Orthopedic Surgery

## 2019-11-03 ENCOUNTER — Other Ambulatory Visit: Payer: Self-pay

## 2019-11-03 DIAGNOSIS — M79605 Pain in left leg: Secondary | ICD-10-CM

## 2019-11-03 NOTE — Progress Notes (Signed)
Office Visit Note   Patient: Megan Good           Date of Birth: 1950/07/10           MRN: [id number] Visit Date: 11/03/2019 Requested by: Deeann Saint, MD [address] [place],  [place] [address] PCP: Deeann Saint, MD  Subjective: Chief Complaint  Patient presents with  . Left Ankle - Pain  . Left Leg - Pain    HPI: Patient presents for follow-up of left ankle.  She is reporting pain swelling as well as numbness in the toes when she is walking.  She has decreased her walking endurance from 4 miles to 2 miles in terms of how much she walks every day.  Takes gabapentin once a day.  MRI scheduled for neck and shoulder June 2.  She is not having any right-sided foot symptoms.  She has been having some left-sided anterior chest pain which I talked to her about briefly.  Recommended that even though she has been to the emergency department twice with negative acute work-up for heart attack that she discussed with her primary care physician potentially seeing a cardiologist about this issue.  I think she is having some shoulder issues but most of that is focal in the shoulder.              ROS: All systems reviewed are negative as they relate to the chief complaint within the history of present illness.  Patient denies  fevers or chills.   Assessment & Plan: Visit Diagnoses:  1. Pain in left leg     Plan: Impression is superficial peroneal nerve entrapment left ankle with worsening symptoms with activity.  She is pretty tender right at the fascial aperture where the superficial peroneal nerve exits on the left-hand side.  Slight tenderness similarly on the right but on the left-hand side it is more tender.  That nerve is actually visible as slightly swollen on inspection with ankle inversion on the left compared to the right.  Discussed with her that activity modification versus surgical release of that fascia are her options.  She is going to go with activity  modification for now.  We will see how that works out and I will see her back after her scans.  Follow-Up Instructions: Return for after MRI.   Orders:  Orders Placed This Encounter  Procedures  . XR Tibia/Fibula Left   No orders of the defined types were placed in this encounter.     Procedures: No procedures performed   Clinical Data: No additional findings.  Objective: Vital Signs: There were no vitals taken for this visit.  Physical Exam:   Constitutional: Patient appears well-developed HEENT:  Head: Normocephalic Eyes:EOM are normal Neck: Normal range of motion Cardiovascular: Normal rate Pulmonary/chest: Effort normal Neurologic: Patient is alert Skin: Skin is warm Psychiatric: Patient has normal mood and affect    Ortho Exam: Ortho exam demonstrates normal gait alignment.  Palpable pedal pulses.  Superficial peroneal nerve on the right is visible and appears like a pencil thin line as is typical.  Only slight tenderness handbreadth above the lateral malleolus where the nerve exits.  On the left-hand side however the nerve is slightly more edematous on visual inspection.  More tenderness to palpation at the aperture where it exits the anterior compartment and lateral compartment region.  Ankle inversion eversion strength is intact.  Does have some paresthesias on the left dorsal foot compared to the right.  Compartments otherwise soft to palpation.  Specialty Comments:  No specialty comments available.  Imaging: XR Tibia/Fibula Left  Result Date: 11/03/2019 AP lateral left tib-fib reviewed.  No acute fracture.  Bone density normal.  No abnormal calcification.  Normal left tib-fib radiographs.    PMFS History: Patient Active Problem List   Diagnosis Date Noted  . Radicular neuropathy 09/02/2019   Past Medical History:  Diagnosis Date  . Headache     Family History  Problem Relation Age of Onset  . Healthy Mother 14  . Other Father        MVA      Past Surgical History:  Procedure Laterality Date  . BREAST BIOPSY Left 2003  . TONSILLECTOMY     Social History   Occupational History  . 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 Smoking status: Never Smoker  . Smokeless tobacco: Never Used  Substance and Sexual Activity  . Alcohol use: Never  . Drug use: Not on file  . Sexual activity: Not on file

## 2019-11-04 ENCOUNTER — Telehealth: Payer: Self-pay | Admitting: Family Medicine

## 2019-11-04 NOTE — Telephone Encounter (Signed)
Ok with me 

## 2019-11-04 NOTE — Telephone Encounter (Signed)
Pt would like to know if you would be okay, if she transferred to Dr. Ardyth Harps? Pt was in today with her friend to see Dr. Ardyth Harps pt asked, if she would accept her as a pt and per pt, Dr. Ardyth Harps, agreed. Is it okay, for pt to transferred to Dr. Ardyth Harps from Dr. Salomon Fick? Please advise. Thanks

## 2019-11-06 NOTE — Telephone Encounter (Signed)
Ok

## 2019-11-09 ENCOUNTER — Other Ambulatory Visit: Payer: Self-pay | Admitting: Orthopedic Surgery

## 2019-11-09 NOTE — Telephone Encounter (Signed)
Spoke with pt and is aware that Dr. Ardyth Harps, will be her primary physician. Pt will call back to schedule an appt.

## 2019-11-10 ENCOUNTER — Ambulatory Visit
Admission: RE | Admit: 2019-11-10 | Discharge: 2019-11-10 | Disposition: A | Discharge status: Home / Self Care | Payer: Medicare PPO | Source: Ambulatory Visit | Attending: Orthopedic Surgery | Admitting: Orthopedic Surgery

## 2019-11-10 ENCOUNTER — Other Ambulatory Visit: Payer: Self-pay

## 2019-11-10 DIAGNOSIS — M25512 Pain in left shoulder: Secondary | ICD-10-CM

## 2019-11-10 DIAGNOSIS — M75112 Incomplete rotator cuff tear or rupture of left shoulder, not specified as traumatic: Secondary | ICD-10-CM | POA: Diagnosis not present

## 2019-11-10 DIAGNOSIS — M4802 Spinal stenosis, cervical region: Secondary | ICD-10-CM | POA: Diagnosis not present

## 2019-11-10 DIAGNOSIS — M542 Cervicalgia: Secondary | ICD-10-CM

## 2019-11-10 MED ORDER — IOPAMIDOL (ISOVUE-M 200) INJECTION 41%
20.0000 mL | Freq: Once | INTRAMUSCULAR | Status: AC
  Administered 2019-11-10: 20 mL via INTRA_ARTICULAR

## 2019-11-12 ENCOUNTER — Other Ambulatory Visit: Payer: Self-pay

## 2019-11-15 ENCOUNTER — Other Ambulatory Visit: Payer: Self-pay

## 2019-11-15 ENCOUNTER — Other Ambulatory Visit (INDEPENDENT_AMBULATORY_CARE_PROVIDER_SITE_OTHER): Payer: Medicare PPO

## 2019-11-15 DIAGNOSIS — Z13220 Encounter for screening for lipoid disorders: Secondary | ICD-10-CM | POA: Diagnosis not present

## 2019-11-15 DIAGNOSIS — R7989 Other specified abnormal findings of blood chemistry: Secondary | ICD-10-CM

## 2019-11-15 LAB — LIPID PANEL
Cholesterol: 256 mg/dL — ABNORMAL HIGH (ref 0–200)
HDL: 60.4 mg/dL (ref >39.00)
LDL Cholesterol: 183 mg/dL — ABNORMAL HIGH (ref 0–99)
NonHDL: 195.37
Total CHOL/HDL Ratio: 4
Triglycerides: 61 mg/dL (ref 0.0–149.0)
VLDL: 12.2 mg/dL (ref 0.0–40.0)

## 2019-11-29 ENCOUNTER — Ambulatory Visit: Payer: Medicare PPO | Admitting: Orthopedic Surgery

## 2019-11-29 DIAGNOSIS — M79605 Pain in left leg: Secondary | ICD-10-CM

## 2019-11-29 MED ORDER — GABAPENTIN 100 MG PO CAPS
100.0000 mg | ORAL_CAPSULE | Freq: Two times a day (BID) | ORAL | 0 refills | Status: DC
Start: 2019-11-29 — End: 2020-05-29

## 2019-12-01 ENCOUNTER — Encounter: Payer: Self-pay | Admitting: Orthopedic Surgery

## 2019-12-01 NOTE — Progress Notes (Signed)
Office Visit Note   Patient: Megan Good           Date of Birth: August 07, 1950           MRN: [id number] Visit Date: 11/29/2019 Requested by: Isaac Bliss, Rayford Halsted, MD [place],  [place] [id number] PCP: Isaac Bliss, Rayford Halsted, MD  Subjective: Chief Complaint  Patient presents with  . Follow-up    scan review    HPI: Megan Good presents for follow-up left shoulder and C-spine MRI scans.  Reports pain to the elbow only.  Hard for her to sleep at night sometimes.  Hard for her to lay down on that left-hand side.  Does describe some neck pain when she is doing chores.  She also has left lower extremity fascial herniation giving her superficial peroneal nerve compression.  She has modified her walking to try to accommodate for that problem.  Shoulder MRI scan demonstrates mild subacromial subdeltoid bursitis with no labral pathology and no full-thickness rotator cuff tears.  MRI of the cervical spine is also reviewed with the patient and shows progression of multilevel spondylosis compared with 2011.  I think some of these mild disc herniations could be giving her some left-sided symptoms.              ROS: All systems reviewed are negative as they relate to the chief complaint within the history of present illness.  Patient denies  fevers or chills.   Assessment & Plan: Visit Diagnoses:  1. Pain in left leg     Plan: Impression is neck and shoulder pain with no definitive operative problem in the shoulder.  I think she may benefit from injections in the neck but she does not really want a pursue that yet.  She would like to try some Neurontin.  Discussed with her that Neurontin for nerve pain is an off label use but she wants to try it anyway.  We will try 100 mg twice a day for a month to see if it helps.  Otherwise I think she should continue to try to be active but may need to avoid long walks due to that superficial peroneal nerve compression.  Follow-up with  me as needed.  Follow-Up Instructions: Return if symptoms worsen or fail to improve.   Orders:  No orders of the defined types were placed in this encounter.  Meds ordered this encounter  Medications  . gabapentin (NEURONTIN) 100 MG capsule    Sig: Take 1 capsule (100 mg total) by mouth 2 (two) times daily.    Dispense:  60 capsule    Refill:  0      Procedures: No procedures performed   Clinical Data: No additional findings.  Objective: Vital Signs: There were no vitals taken for this visit.  Physical Exam:   Constitutional: Patient appears well-developed HEENT:  Head: Normocephalic Eyes:EOM are normal Neck: Normal range of motion Cardiovascular: Normal rate Pulmonary/chest: Effort normal Neurologic: Patient is alert Skin: Skin is warm Psychiatric: Patient has normal mood and affect    Ortho Exam: Ortho exam demonstrates normal gait alignment.  Mild tenderness down there about of handbreadth above the lateral malleolus.  That is on the left-hand side.  Compartments are soft bilaterally.  Neck range of motion is full.  Shoulder strength is also good infraspinatus supraspinatus and subscap muscle testing with no paresthesias C5-T1.  No restriction of passive range of motion of that left shoulder is present.  Specialty Comments:  No specialty  comments available.  Imaging: No results found.   PMFS History: Patient Active Problem List   Diagnosis Date Noted  . Radicular neuropathy 09/02/2019   Past Medical History:  Diagnosis Date  . Headache     Family History  Problem Relation Age of Onset  . Healthy Mother 35  . Other Father        MVA    Past Surgical History:  Procedure Laterality Date  . BREAST BIOPSY Left 2003  . TONSILLECTOMY     Social History   Occupational History  . 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 Smoking status: Never Smoker  . Smokeless tobacco: Never Used  Substance and  Sexual Activity  . Alcohol use: Never  . Drug use: Not on file  . Sexual activity: Not on file

## 2019-12-07 ENCOUNTER — Encounter: Payer: Self-pay | Admitting: Internal Medicine

## 2019-12-07 ENCOUNTER — Ambulatory Visit: Payer: Medicare PPO | Admitting: Internal Medicine

## 2019-12-07 ENCOUNTER — Other Ambulatory Visit: Payer: Self-pay

## 2019-12-07 VITALS — BP 130/80 | HR 88 | Temp 97.5°F | Wt 143.7 lb

## 2019-12-07 DIAGNOSIS — E0590 Thyrotoxicosis, unspecified without thyrotoxic crisis or storm: Secondary | ICD-10-CM

## 2019-12-07 DIAGNOSIS — G609 Hereditary and idiopathic neuropathy, unspecified: Secondary | ICD-10-CM

## 2019-12-07 DIAGNOSIS — K219 Gastro-esophageal reflux disease without esophagitis: Secondary | ICD-10-CM | POA: Insufficient documentation

## 2019-12-07 DIAGNOSIS — E785 Hyperlipidemia, unspecified: Secondary | ICD-10-CM | POA: Diagnosis not present

## 2019-12-07 NOTE — Progress Notes (Signed)
Established Patient Office Visit     This visit occurred during the SARS-CoV-2 public health emergency.  Safety protocols were in place, including screening questions prior to the visit, additional usage of staff PPE, and extensive cleaning of exam room while observing appropriate contact time as indicated for disinfecting solutions.    CC/Reason for Visit: Establish care, discuss chronic conditions  HPI: Megan Good is a 69 y.o. female who is coming in today for the above mentioned reasons. Past Medical History is significant for:   1.  GERD that has been well controlled on daily PPI therapy.  2.  Numbness and tingling in her feet that has been diagnosed as peripheral neuropathy.  She has seen orthopedics and had a recent MRI without findings.  She has had some improvement on Neurontin.  3.  Subclinical hyperthyroidism, she had a TSH of 0.51 during her physical in March.  Subsequent TSH and free T4 had normalized.  4.  Hyperlipidemia not on medications.  She has no complaints today.   Past Medical/Surgical History: Past Medical History:  Diagnosis Date  . Headache     Past Surgical History:  Procedure Laterality Date  . BREAST BIOPSY Left 2003  . TONSILLECTOMY      Social History:  reports that she has never smoked. She has never used smokeless tobacco. She reports that she does not drink alcohol. No history on file for drug use.  Allergies: Allergies  Allergen Reactions  . Penicillins Swelling and Rash    Has patient had a PCN reaction causing immediate rash, facial/tongue/throat swelling, SOB or lightheadedness with hypotension: No Has patient had a PCN reaction causing severe rash involving mucus membranes or skin necrosis: No Has patient had a PCN reaction that required hospitalization: No Has patient had a PCN reaction occurring within the last 10 years: No If all of the above answers are "NO", then may proceed with Cephalosporin use.  Burn-like  rash    Family History:  Family History  Problem Relation Age of Onset  . Healthy Mother 33  . Other Father        MVA     Current Outpatient Medications:  .  cyclobenzaprine (FLEXERIL) 5 MG tablet, Take 1 tablet (5 mg total) by mouth 3 (three) times daily as needed for muscle spasms., Disp: 60 tablet, Rfl: 1 .  gabapentin (NEURONTIN) 100 MG capsule, Take 1 capsule (100 mg total) by mouth 2 (two) times daily., Disp: 60 capsule, Rfl: 0 .  ibuprofen (ADVIL) 800 MG tablet, Take 1 tablet (800 mg total) by mouth every 8 (eight) hours as needed., Disp: 30 tablet, Rfl: 0 .  pantoprazole (PROTONIX) 20 MG tablet, Take 1 tablet (20 mg total) by mouth daily., Disp: 90 tablet, Rfl: 0  Review of Systems:  Constitutional: Denies fever, chills, diaphoresis, appetite change and fatigue.  HEENT: Denies photophobia, eye pain, redness, hearing loss, ear pain, congestion, sore throat, rhinorrhea, sneezing, mouth sores, trouble swallowing, neck pain, neck stiffness and tinnitus.   Respiratory: Denies SOB, DOE, cough, chest tightness,  and wheezing.   Cardiovascular: Denies chest pain, palpitations and leg swelling.  Gastrointestinal: Denies nausea, vomiting, abdominal pain, diarrhea, constipation, blood in stool and abdominal distention.  Genitourinary: Denies dysuria, urgency, frequency, hematuria, flank pain and difficulty urinating.  Endocrine: Denies: hot or cold intolerance, sweats, changes in hair or nails, polyuria, polydipsia. Musculoskeletal: Denies myalgias, back pain, joint swelling, arthralgias and gait problem.  Skin: Denies pallor, rash and wound.  Neurological: Denies  dizziness, seizures, syncope, weakness, light-headedness, numbness and headaches.  Hematological: Denies adenopathy. Easy bruising, personal or family bleeding history  Psychiatric/Behavioral: Denies suicidal ideation, mood changes, confusion, nervousness, sleep disturbance and agitation    Physical Exam: Vitals:    12/07/19 0742  BP: 130/80  Pulse: 88  Temp: (!) 97.5 F (36.4 C)  TempSrc: Temporal  SpO2: 98%  Weight: 143 lb 11.2 oz (65.2 kg)    Body mass index is 24.67 kg/m.   Constitutional: NAD, calm, comfortable Eyes: PERRL, lids and conjunctivae normal ENMT: Mucous membranes are moist.  Respiratory: clear to auscultation bilaterally, no wheezing, no crackles. Normal respiratory effort. No accessory muscle use.  Cardiovascular: Regular rate and rhythm, no murmurs / rubs / gallops. No extremity edema.  Neurologic: Mostly intact nonfocal. Psychiatric: Normal judgment and insight. Alert and oriented x 3. Normal mood.    Impression and Plan:  Idiopathic peripheral neuropathy -Etiology. -Continue gabapentin.  Gastroesophageal reflux disease without esophagitis -Well-controlled on daily PPI therapy.  Hyperlipidemia, unspecified hyperlipidemia type -Recheck lipids in 3 to 6 months.  Subclinical hyperthyroidism -Recheck thyroid function tests in 3 to 6 months.   Patient Instructions  -Nice seeing you today!!  -Vit B12 level was good in March.  -Schedule follow up visit in 4 months, please come in fasting so we can recheck your cholesterol.     Chaya Jan, MD [facility]

## 2019-12-07 NOTE — Patient Instructions (Signed)
-  Nice seeing you today!!  -Vit B12 level was good in March.  -Schedule follow up visit in 4 months, please come in fasting so we can recheck your cholesterol.

## 2020-02-28 ENCOUNTER — Telehealth: Payer: Self-pay | Admitting: Internal Medicine

## 2020-02-28 DIAGNOSIS — E785 Hyperlipidemia, unspecified: Secondary | ICD-10-CM

## 2020-02-28 DIAGNOSIS — R7989 Other specified abnormal findings of blood chemistry: Secondary | ICD-10-CM

## 2020-02-28 NOTE — Telephone Encounter (Signed)
Patient states she was supposed to have orders in for Thyroid and cholesterol check.  She said it is time to get it done per Dr Ardyth Harps.

## 2020-02-29 NOTE — Telephone Encounter (Signed)
Yes

## 2020-02-29 NOTE — Telephone Encounter (Signed)
Okay to order?

## 2020-02-29 NOTE — Telephone Encounter (Signed)
Labs ordered and appointment scheduled. 

## 2020-03-08 ENCOUNTER — Other Ambulatory Visit: Payer: Self-pay

## 2020-03-09 ENCOUNTER — Other Ambulatory Visit: Payer: Medicare PPO

## 2020-03-09 DIAGNOSIS — E785 Hyperlipidemia, unspecified: Secondary | ICD-10-CM | POA: Diagnosis not present

## 2020-03-09 DIAGNOSIS — R7989 Other specified abnormal findings of blood chemistry: Secondary | ICD-10-CM

## 2020-03-10 ENCOUNTER — Other Ambulatory Visit: Payer: Self-pay | Admitting: Internal Medicine

## 2020-03-10 DIAGNOSIS — E785 Hyperlipidemia, unspecified: Secondary | ICD-10-CM

## 2020-03-10 LAB — LIPID PANEL
Cholesterol: 242 mg/dL — ABNORMAL HIGH (ref <200)
HDL: 65 mg/dL (ref >=50)
LDL Cholesterol (Calc): 161 mg/dL (calc) — ABNORMAL HIGH
Non-HDL Cholesterol (Calc): 177 mg/dL (calc) — ABNORMAL HIGH (ref <130)
Total CHOL/HDL Ratio: 3.7 (calc) (ref <5.0)
Triglycerides: 61 mg/dL (ref <150)

## 2020-03-10 LAB — TSH: TSH: 0.68 mIU/L (ref 0.40–4.50)

## 2020-03-10 MED ORDER — ATORVASTATIN CALCIUM 20 MG PO TABS: 20.0000 mg | ORAL_TABLET | Freq: Every day | ORAL | 1 refills | Status: DC

## 2020-03-30 ENCOUNTER — Emergency Department (HOSPITAL_COMMUNITY): Payer: Medicare PPO

## 2020-03-30 ENCOUNTER — Other Ambulatory Visit: Payer: Self-pay

## 2020-03-30 ENCOUNTER — Emergency Department (HOSPITAL_COMMUNITY)
Admission: EM | Admit: 2020-03-30 | Discharge: 2020-03-30 | Disposition: A | Discharge status: Home / Self Care | Payer: Medicare PPO | Attending: Emergency Medicine | Admitting: Emergency Medicine

## 2020-03-30 ENCOUNTER — Encounter (HOSPITAL_COMMUNITY): Payer: Self-pay | Admitting: Emergency Medicine

## 2020-03-30 DIAGNOSIS — H538 Other visual disturbances: Secondary | ICD-10-CM

## 2020-03-30 DIAGNOSIS — I672 Cerebral atherosclerosis: Secondary | ICD-10-CM | POA: Diagnosis not present

## 2020-03-30 DIAGNOSIS — J3489 Other specified disorders of nose and nasal sinuses: Secondary | ICD-10-CM | POA: Diagnosis not present

## 2020-03-30 DIAGNOSIS — H547 Unspecified visual loss: Secondary | ICD-10-CM | POA: Diagnosis not present

## 2020-03-30 DIAGNOSIS — I6601 Occlusion and stenosis of right middle cerebral artery: Secondary | ICD-10-CM | POA: Diagnosis not present

## 2020-03-30 DIAGNOSIS — R9082 White matter disease, unspecified: Secondary | ICD-10-CM | POA: Diagnosis not present

## 2020-03-30 DIAGNOSIS — G459 Transient cerebral ischemic attack, unspecified: Secondary | ICD-10-CM

## 2020-03-30 DIAGNOSIS — Z7982 Long term (current) use of aspirin: Secondary | ICD-10-CM | POA: Diagnosis not present

## 2020-03-30 LAB — COMPREHENSIVE METABOLIC PANEL WITH GFR
ALT: 19 U/L (ref 0–44)
AST: 25 U/L (ref 15–41)
Albumin: 4.1 g/dL (ref 3.5–5.0)
Alkaline Phosphatase: 46 U/L (ref 38–126)
Anion gap: 10 (ref 5–15)
BUN: 9 mg/dL (ref 8–23)
CO2: 24 mmol/L (ref 22–32)
Calcium: 9.1 mg/dL (ref 8.9–10.3)
Chloride: 106 mmol/L (ref 98–111)
Creatinine, Ser: 0.8 mg/dL (ref 0.44–1.00)
GFR, Estimated: >60 mL/min
Glucose, Bld: 106 mg/dL — ABNORMAL HIGH (ref 70–99)
Potassium: 3.6 mmol/L (ref 3.5–5.1)
Sodium: 140 mmol/L (ref 135–145)
Total Bilirubin: 0.6 mg/dL (ref 0.3–1.2)
Total Protein: 6.9 g/dL (ref 6.5–8.1)

## 2020-03-30 LAB — CBC WITH DIFFERENTIAL/PLATELET
Abs Immature Granulocytes: 0.02 K/uL (ref 0.00–0.07)
Basophils Absolute: 0 K/uL (ref 0.0–0.1)
Basophils Relative: 1 %
Eosinophils Absolute: 0 K/uL (ref 0.0–0.5)
Eosinophils Relative: 0 %
HCT: 37.2 % (ref 36.0–46.0)
Hemoglobin: 12.5 g/dL (ref 12.0–15.0)
Immature Granulocytes: 0 %
Lymphocytes Relative: 22 %
Lymphs Abs: 1.5 K/uL (ref 0.7–4.0)
MCH: 28.6 pg (ref 26.0–34.0)
MCHC: 33.6 g/dL (ref 30.0–36.0)
MCV: 85.1 fL (ref 80.0–100.0)
Monocytes Absolute: 0.4 K/uL (ref 0.1–1.0)
Monocytes Relative: 6 %
Neutro Abs: 4.8 K/uL (ref 1.7–7.7)
Neutrophils Relative %: 71 %
Platelets: 165 K/uL (ref 150–400)
RBC: 4.37 MIL/uL (ref 3.87–5.11)
RDW: 13.1 % (ref 11.5–15.5)
WBC: 6.8 K/uL (ref 4.0–10.5)
nRBC: 0 % (ref 0.0–0.2)

## 2020-03-30 MED ORDER — CLOPIDOGREL BISULFATE 75 MG PO TABS
75.0000 mg | ORAL_TABLET | Freq: Every day | ORAL | 0 refills | Status: DC
Start: 2020-03-31 — End: 2020-05-29

## 2020-03-30 MED ORDER — LORAZEPAM 2 MG/ML IJ SOLN
1.0000 mg | Freq: Once | INTRAMUSCULAR | Status: AC
  Administered 2020-03-30: 1 mg via INTRAVENOUS
  Filled 2020-03-30: qty 1

## 2020-03-30 MED ORDER — CLOPIDOGREL BISULFATE 75 MG PO TABS
75.0000 mg | ORAL_TABLET | Freq: Once | ORAL | Status: AC
  Administered 2020-03-30: 75 mg via ORAL
  Filled 2020-03-30: qty 1

## 2020-03-30 MED ORDER — IOHEXOL 350 MG/ML SOLN
75.0000 mL | Freq: Once | INTRAVENOUS | Status: AC | PRN
  Administered 2020-03-30: 75 mL via INTRAVENOUS

## 2020-03-30 NOTE — Discharge Instructions (Addendum)
It was our pleasure to provide your ER care today - we hope that you feel better.  Continue an aspirin a day. Also take plavix as prescribed.   Follow up with neurologist in the coming week - call office tomorrow to arrange appointment.   Return to ER right away if worse, new symptoms, fevers, change in speech or vision, one-sided numbness/weakness, trouble breathing, or other concern.

## 2020-03-30 NOTE — ED Notes (Signed)
Patient verbalizes understanding of discharge instructions. Opportunity for questioning and answers were provided. Armband removed by staff, pt discharged from ED in wheelchair to home.   

## 2020-03-30 NOTE — ED Triage Notes (Signed)
Pt states she was send by pcp for further evaluation of blurred vision that she had this morning a 5 am, pt states she is not having it now. Pt is AO x 4 no neuro deficit noticed.

## 2020-03-30 NOTE — ED Provider Notes (Addendum)
Signed out by Dr Renaye Rakers to d/c to home if MRI negative for cva.   Earlier symptoms resolved. No numbness/weakness. No change in speech or vision.  Neurology consulted re cta, and ?whether to start plavix + asa, given earlier symptoms. Discussed pt with Dr Amada Jupiter, who reviewed cta, he indicates would give rx plavix for 3 weeks, and to have f/u neurology as outpt.   Will give rx plavix.  Neurology f/u as outpt.   Return precautions provided.        Cathren Laine, MD 03/30/20 2007

## 2020-03-30 NOTE — ED Notes (Signed)
Pt transported to MRI 

## 2020-03-30 NOTE — ED Provider Notes (Signed)
[facility] EMERGENCY DEPARTMENT Provider Note   CSN: [id number] Arrival date & time: 03/30/20  1235     History CC: Blurred vision  Crescent Gotham is a 69 y.o. female with a history of hypertension, hyperlipidemia, presenting to the t emergency department transient episode of blurred vision.  Patient reports that she woke up with normal vision and feeling fine this morning.  She then fell asleep and took another nap.  When she woke up again she is having blurred vision in both of her eyes.  She describes her vision like everything was "in a fog".  This lasted a few hours, and is subsequently gradually resolved.  Her vision is back to normal.  She said this never happened to her before.  She denies any associated headache, dizziness, nausea, vomiting, numbness or weakness.  She denies any history of stroke or TIA.  She denies any smoking history.  HPI     Past Medical History:  Diagnosis Date  . Headache     Patient Active Problem List   Diagnosis Date Noted  . GERD (gastroesophageal reflux disease) 12/07/2019  . Hyperlipidemia 12/07/2019  . Subclinical hyperthyroidism 12/07/2019  . Radicular neuropathy 09/02/2019    Past Surgical History:  Procedure Laterality Date  . BREAST BIOPSY Left 2003  . TONSILLECTOMY       OB History   No obstetric history on file.     Family History  Problem Relation Age of Onset  . Healthy Mother 82  . Other Father        MVA    Social History   Tobacco Use  . Smoking status: Never Smoker  . Smokeless tobacco: Never Used  Substance Use Topics  . Alcohol use: Never  . Drug use: Not on file    Home Medications Prior to Admission medications   Medication Sig Start Date End Date Taking? Authorizing Provider  aspirin EC 81 MG tablet Take 81 mg by mouth daily. Swallow whole.   Yes [provider]  atorvastatin (LIPITOR) 20 MG tablet Take 1 tablet (20 mg total) by mouth daily. 03/10/20  Yes Philip Aspen, Limmie Patricia, MD  cholecalciferol (VITAMIN D3) 25 MCG (1000 UNIT) tablet Take 1,000 Units by mouth daily.   Yes [provider]  gabapentin (NEURONTIN) 100 MG capsule Take 1 capsule (100 mg total) by mouth 2 (two) times daily. Patient taking differently: Take 200 mg by mouth at bedtime.  11/29/19  Yes Cammy Copa, MD  Multiple Vitamins-Minerals (MULTIVITAMIN ADULTS PO) Take 1 tablet by mouth daily.   Yes [provider]  Multiple Vitamins-Minerals (ZINC PO) Take 1 tablet by mouth daily.   Yes [provider]  Potassium 99 MG TABS Take 1 tablet by mouth daily.   Yes [provider]  cyclobenzaprine (FLEXERIL) 5 MG tablet Take 1 tablet (5 mg total) by mouth 3 (three) times daily as needed for muscle spasms. Patient not taking: Reported on 03/30/2020 08/11/19   Deeann Saint, MD  ibuprofen (ADVIL) 800 MG tablet Take 1 tablet (800 mg total) by mouth every 8 (eight) hours as needed. Patient not taking: Reported on 03/30/2020 10/20/18   Deeann Saint, MD  pantoprazole (PROTONIX) 20 MG tablet Take 1 tablet (20 mg total) by mouth daily. Patient not taking: Reported on 03/30/2020 06/12/18   Deeann Saint, MD    Allergies    Penicillins  Review of Systems   Review of Systems  Constitutional: Negative for chills and  fever.  Eyes: Positive for visual disturbance. Negative for photophobia, pain, discharge, redness and itching.  Respiratory: Negative for cough and shortness of breath.   Cardiovascular: Negative for chest pain and palpitations.  Gastrointestinal: Negative for abdominal pain and vomiting.  Musculoskeletal: Negative for arthralgias, neck pain and neck stiffness.  Skin: Negative for color change and rash.  Neurological: Negative for dizziness, syncope, facial asymmetry, speech difficulty, weakness, light-headedness, numbness and headaches.  Psychiatric/Behavioral: Negative for agitation and confusion.  All other systems reviewed and are  negative.   Physical Exam Updated Vital Signs BP (!) 178/96 (BP Location: Right Arm)   Pulse 87   Temp 98.1 F (36.7 C) (Oral)   Resp 16   Ht 5\' 4"  (1.626 m)   Wt 65.2 kg   SpO2 100%   BMI 24.67 kg/m   Physical Exam Vitals and nursing note reviewed.  Constitutional:      General: She is not in acute distress.    Appearance: She is well-developed.  HENT:     Head: Normocephalic and atraumatic.  Eyes:     General: Lids are everted, no foreign bodies appreciated. Vision grossly intact. Gaze aligned appropriately.        Right eye: No foreign body or discharge.        Left eye: No foreign body or discharge.     Extraocular Movements: Extraocular movements intact.     Right eye: Normal extraocular motion.     Left eye: Normal extraocular motion.     Conjunctiva/sclera: Conjunctivae normal.     Right eye: Right conjunctiva is not injected. No chemosis, exudate or hemorrhage.    Left eye: Left conjunctiva is not injected. No chemosis, exudate or hemorrhage.    Pupils: Pupils are equal, round, and reactive to light.     Comments: Vision 20/30 right eye and 20/25 left eye  Cardiovascular:     Rate and Rhythm: Normal rate and regular rhythm.     Pulses: Normal pulses.     Heart sounds: No murmur heard.   Pulmonary:     Effort: Pulmonary effort is normal. No respiratory distress.     Breath sounds: Normal breath sounds.  Musculoskeletal:     Cervical back: Normal range of motion and neck supple. No rigidity.  Skin:    General: Skin is warm and dry.  Neurological:     General: No focal deficit present.     Mental Status: She is alert and oriented to person, place, and time. Mental status is at baseline.     Cranial Nerves: No cranial nerve deficit.     Sensory: No sensory deficit.     Motor: No weakness.     Coordination: Coordination normal.  Psychiatric:        Mood and Affect: Mood normal.        Behavior: Behavior normal.     ED Results / Procedures / Treatments     Labs (all labs ordered are listed, but only abnormal results are displayed) Labs Reviewed  COMPREHENSIVE METABOLIC PANEL - Abnormal; Notable for the following components:      Result Value   Glucose, Bld 106 (*)    All other components within normal limits  CBC WITH DIFFERENTIAL/PLATELET    EKG None  Radiology No results found.  Procedures Procedures (including critical care time)  Medications Ordered in ED Medications - No data to display  ED Course  I have reviewed the triage vital signs and the nursing notes.  Pertinent labs &  imaging results that were available during my care of the patient were reviewed by me and considered in my medical decision making (see chart for details).  This patient complains of transient blurred vision today, now resolved .  This involves an extensive number of treatment options, and is a complaint that carries with it a high risk of complications and morbidity.  The differential diagnosis includes TIA vs posterior circulation stroke vs ICH vs hypertensive emergency vs SS/autoimmune disease vs other  She is well appearing on exam, no focal neurological deficits, low concern for [facility] or ongoing stroke.  Given her symptoms were bilateral (she confirms both eyes), this is less likely CRAO or retinal detachment.  I ordered, reviewed, and interpreted labs, which included CMP and CBC, largely unremarkable I ordered imaging studies which included CTA head and neck and MRI brain    Clinical Course as of Mar 30 1713  Thu Mar 30, 2020  1423 I spoke to Dr Wilford Corner from neurology who recommended CTA head and neck w/ and w/o contrast, if negative would proceed to MRI brain.  This would be for posterior circulation stroke/TIA/vascular evaluation.  If MRI is negative she can be discharged with neuro f/u.   [MT]  1559 Signed out to afternoon EDP with plan to f/u on CTA, MRI if necessary.  Patient updated regarding plan as well   [MT]    Clinical Course User  Index [MT] Kento Gossman, Kermit Balo, MD    Final Clinical Impression(s) / ED Diagnoses Final diagnoses:  None    Rx / DC Orders ED Discharge Orders    None       Renaye Rakers Kermit Balo, MD 03/30/20 1714

## 2020-03-30 NOTE — ED Notes (Signed)
Pt returned from MRI °

## 2020-03-31 NOTE — ED Notes (Signed)
This RN wasted 1 mg of Ativan IV in the [employer]; witnessed by Mitchel Honour, RN.

## 2020-04-05 ENCOUNTER — Ambulatory Visit: Payer: Medicare PPO | Admitting: Diagnostic Neuroimaging

## 2020-04-05 ENCOUNTER — Encounter: Payer: Self-pay | Admitting: Diagnostic Neuroimaging

## 2020-04-05 VITALS — BP 156/83 | HR 62 | Ht 63.0 in | Wt 144.4 lb

## 2020-04-05 DIAGNOSIS — H538 Other visual disturbances: Secondary | ICD-10-CM | POA: Diagnosis not present

## 2020-04-05 NOTE — Patient Instructions (Signed)
  TRANSIENT BLURRED VISION (possible hypertensive emergency; 178/96 at ER) - measure BP at home - continue statin, aspirin (complete 3 weeks plavix, then stop) - follow up with PCP and ophthalmology

## 2020-04-05 NOTE — Progress Notes (Signed)
[facility]  PATIENT: Megan Good DOB: May 10, 1951  REFERRING CLINICIAN: Philip Aspen, [place]* HISTORY FROM: patient REASON FOR VISIT: new consult    HISTORICAL  CHIEF COMPLAINT:  Chief Complaint  Patient presents with   Transient Ischemic Attack    rm 6 ED referral "last Fri my eyes wouldn't focus x 1 hour, it resolved but PCP told me to go to ED"   Blurry vision    HISTORY OF PRESENT ILLNESS:   69 year old female here for evaluation of transient blurred vision.  03/30/2020 patient woke up with blurred vision, bilaterally.  No headache, numbness, weakness, slurred speech or vertigo.  Symptoms lasted about 1 hour and then resolved.  Patient went to ER for evaluation.  Blood pressure was noted to be elevated at 178/96.  MRI of the brain showed no acute findings.  CT of the head and neck showed multiple intracranial stenoses.  She was started on Plavix.    Since that time no recurrent symptoms.     REVIEW OF SYSTEMS: Full 14 system review of systems performed and negative with exception of: As per HPI.  ALLERGIES: Allergies  Allergen Reactions   Penicillins Swelling and Rash    Has patient had a PCN reaction causing immediate rash, facial/tongue/throat swelling, SOB or lightheadedness with hypotension: No Has patient had a PCN reaction causing severe rash involving mucus membranes or skin necrosis: No Has patient had a PCN reaction that required hospitalization: No Has patient had a PCN reaction occurring within the last 10 years: No If all of the above answers are "NO", then may proceed with Cephalosporin use.  Burn-like rash    HOME MEDICATIONS: Outpatient Medications Prior to Visit  Medication Sig Dispense Refill   aspirin EC 81 MG tablet Take 81 mg by mouth daily. Swallow whole.     atorvastatin (LIPITOR) 20 MG tablet Take 1 tablet (20 mg total) by mouth daily. 90 tablet 1   cholecalciferol (VITAMIN D3) 25 MCG (1000 UNIT)  tablet Take 1,000 Units by mouth daily.     clopidogrel (PLAVIX) 75 MG tablet Take 1 tablet (75 mg total) by mouth daily. 20 tablet 0   gabapentin (NEURONTIN) 100 MG capsule Take 1 capsule (100 mg total) by mouth 2 (two) times daily. (Patient taking differently: Take 200 mg by mouth at bedtime. ) 60 capsule 0   Multiple Vitamins-Minerals (MULTIVITAMIN ADULTS PO) Take 1 tablet by mouth daily.     Multiple Vitamins-Minerals (ZINC PO) Take 1 tablet by mouth daily.     Potassium 99 MG TABS Take 1 tablet by mouth daily.     No facility-administered medications prior to visit.    PAST MEDICAL HISTORY: Past Medical History:  Diagnosis Date   Headache     PAST SURGICAL HISTORY: Past Surgical History:  Procedure Laterality Date   BREAST BIOPSY Left 2003   TONSILLECTOMY      FAMILY HISTORY: Family History  Problem Relation Age of Onset   Healthy Mother 13   Other Father        MVA   Stroke Maternal Grandmother     SOCIAL HISTORY: Social History   Socioeconomic History   Marital status: Divorced    Spouse name: Not on file   Number of children: 1   Years of education: Not on file   Highest education level: Some college, no degree  Occupational History   Occupation: [employer]AL[employer]    Employer: [employer]    Comment: retired 2019  Tobacco Use   Smoking status: Never Smoker   Smokeless tobacco: Never Used  Substance and Sexual Activity   Alcohol use: Never   Drug use: Never   Sexual activity: Not on file  Other Topics Concern   Not on file  Social History Narrative   Patient is right-handed. She lives alone in a 2 story house. She occasionally drinks coffee on the weekends. She walks 2-4 miles per day weather permitting. Recently she has been unable to walk dues to headaches.   Social Determinants of Health   Financial Resource Strain:    Difficulty of Paying Living Expenses: Not on file  Food Insecurity:    Worried About  Programme researcher, broadcasting/film/video in the Last Year: Not on file   [employer] of Food in the Last Year: Not on file  Transportation Needs:    Lack of Transportation (Medical): Not on file   Lack of Transportation (Non-Medical): Not on file  Physical Activity:    Days of Exercise per Week: Not on file   Minutes of Exercise per Session: Not on file  Stress:    Feeling of Stress : Not on file  Social Connections:    Frequency of Communication with Friends and Family: Not on file   Frequency of Social Gatherings with Friends and Family: Not on file   Attends Religious Services: Not on file   Active Member of Clubs or Organizations: Not on file   Attends Banker Meetings: Not on file   Marital Status: Not on file  Intimate Partner Violence:    Fear of Current or Ex-Partner: Not on file   Emotionally Abused: Not on file   Physically Abused: Not on file   Sexually Abused: Not on file     PHYSICAL EXAM  GENERAL EXAM/CONSTITUTIONAL: Vitals:  Vitals:   04/05/20 1432  BP: (!) 165/80  Pulse: 67  Weight: 144 lb 6.4 oz (65.5 kg)  Height: 5\' 3"  (1.6 m)     Body mass index is 25.58 kg/m. Wt Readings from Last 3 Encounters:  04/05/20 144 lb 6.4 oz (65.5 kg)  03/30/20 143 lb 11.8 oz (65.2 kg)  12/07/19 143 lb 11.2 oz (65.2 kg)     Patient is in no distress; well developed, nourished and groomed; neck is supple  CARDIOVASCULAR:  Examination of carotid arteries is normal; no carotid bruits  Regular rate and rhythm, no murmurs  Examination of peripheral vascular system by observation and palpation is normal  EYES:  Ophthalmoscopic exam of optic discs and posterior segments is normal; no papilledema or hemorrhages  No exam data present  MUSCULOSKELETAL:  Gait, strength, tone, movements noted in Neurologic exam below  NEUROLOGIC: MENTAL STATUS:  No flowsheet data found.  awake, alert, oriented to person, place and time  recent and remote memory  intact  normal attention and concentration  language fluent, comprehension intact, naming intact  fund of knowledge appropriate  CRANIAL NERVE:   2nd - no papilledema on fundoscopic exam  2nd, 3rd, 4th, 6th - pupils equal and reactive to light, visual fields full to confrontation, extraocular muscles intact, no nystagmus  5th - facial sensation symmetric  7th - facial strength symmetric  8th - hearing intact  9th - palate elevates symmetrically, uvula midline  11th - shoulder shrug symmetric  12th - tongue protrusion midline  MOTOR:   normal bulk and tone, full strength in the BUE, BLE  SENSORY:   normal and symmetric to light touch, temperature, vibration  COORDINATION:   finger-nose-finger, fine finger movements normal  REFLEXES:   deep tendon reflexes present and symmetric  GAIT/STATION:   narrow based gait     DIAGNOSTIC DATA (LABS, IMAGING, TESTING) - I reviewed patient records, labs, notes, testing and imaging myself where available.  Lab Results  Component Value Date   WBC 6.8 03/30/2020   HGB 12.5 03/30/2020   HCT 37.2 03/30/2020   MCV 85.1 03/30/2020   PLT 165 03/30/2020      Component Value Date/Time   NA 140 03/30/2020 1300   K 3.6 03/30/2020 1300   CL 106 03/30/2020 1300   CO2 24 03/30/2020 1300   GLUCOSE 106 (H) 03/30/2020 1300   BUN 9 03/30/2020 1300   CREATININE 0.80 03/30/2020 1300   CALCIUM 9.1 03/30/2020 1300   PROT 6.9 03/30/2020 1300   ALBUMIN 4.1 03/30/2020 1300   AST 25 03/30/2020 1300   ALT 19 03/30/2020 1300   ALKPHOS 46 03/30/2020 1300   BILITOT 0.6 03/30/2020 1300   GFRNONAA >60 03/30/2020 1300   GFRAA >60 08/12/2019 2005   Lab Results  Component Value Date   CHOL 242 (H) 03/09/2020   HDL 65 03/09/2020   LDLCALC 161 (H) 03/09/2020   TRIG 61 03/09/2020   CHOLHDL 3.7 03/09/2020   Lab Results  Component Value Date   HGBA1C 5.3 08/11/2019   Lab Results  Component Value Date   VITAMINB12 >1500 (H)  09/02/2019   Lab Results  Component Value Date   TSH 0.68 03/09/2020    03/30/20 CT head: 1. Normal non-contrast CT appearance of the brain for age. No evidence of acute intracranial abnormality. 2. Small right maxillary sinus mucous retention cyst.  03/30/20 CTA neck: 1. The common carotid, internal carotid and vertebral arteries are patent within the neck without hemodynamically significant stenosis. 2. Somewhat beaded irregularity of the distal V2 right vertebral artery, which may be atherosclerotic in etiology. Fibromuscular dysplasia can also have this imaging appearance.  03/30/20 CTA head: 1. Intracranial atherosclerotic disease with multifocal stenoses, most notably as follows. 2. Moderate/severe stenosis within the right posterior cerebral artery at the P2/P3 junction. Additionally, there is significant atherosclerotic irregularity of the P4 posterior cerebral arteries bilaterally. 3. Moderate focal stenosis within the inferior division proximal M2 right MCA branch. 4. Moderate segmental stenosis of the A2 left ACA. 5. 1-2 mm vascular protrusion arising from the cavernous left ICA, which may reflect a tiny aneurysm versus atherosclerotic irregularity.  03/30/20 MRI brain  - Negative brain MRI for age. No acute intracranial infarct or other abnormality.   ASSESSMENT AND PLAN  69 y.o. year old female here with:  Dx:  1. Blurred vision     PLAN:  TRANSIENT BLURRED VISION (possible hypertensive emergency; 178/96 at ER) - measure BP at home; follow up with PCP re: BP mgmt - continue statin, aspirin (complete 3 weeks plavix, then stop) - refer to ophthalmology for blurred vision evaluationy  MULTIPLE INTRACRANIAL STENOSES / atherosclerosis - medical management as above; follow-up with PCP  Orders Placed This Encounter  Procedures   Ambulatory referral to Ophthalmology   Return for return to PCP.    Suanne Marker, MD 04/05/2020, 2:55  PM Certified in Neurology, Neurophysiology and Neuroimaging  [facility] [address], [address] [place], [place] [address] [phone]

## 2020-04-19 ENCOUNTER — Encounter: Payer: Self-pay | Admitting: Internal Medicine

## 2020-04-19 ENCOUNTER — Ambulatory Visit: Payer: Medicare PPO | Admitting: Internal Medicine

## 2020-04-19 ENCOUNTER — Other Ambulatory Visit: Payer: Self-pay

## 2020-04-19 VITALS — BP 120/50 | HR 99 | Temp 98.3°F | Wt 142.4 lb

## 2020-04-19 DIAGNOSIS — R030 Elevated blood-pressure reading, without diagnosis of hypertension: Secondary | ICD-10-CM

## 2020-04-19 DIAGNOSIS — H539 Unspecified visual disturbance: Secondary | ICD-10-CM

## 2020-04-19 DIAGNOSIS — M791 Myalgia, unspecified site: Secondary | ICD-10-CM

## 2020-04-19 DIAGNOSIS — T466X5A Adverse effect of antihyperlipidemic and antiarteriosclerotic drugs, initial encounter: Secondary | ICD-10-CM

## 2020-04-19 DIAGNOSIS — Z09 Encounter for follow-up examination after completed treatment for conditions other than malignant neoplasm: Secondary | ICD-10-CM

## 2020-04-19 DIAGNOSIS — E785 Hyperlipidemia, unspecified: Secondary | ICD-10-CM | POA: Diagnosis not present

## 2020-04-19 MED ORDER — ATORVASTATIN CALCIUM 20 MG PO TABS: 20.0000 mg | ORAL_TABLET | ORAL | 1 refills | Status: DC

## 2020-04-19 NOTE — Progress Notes (Signed)
Established Patient Office Visit     This visit occurred during the SARS-CoV-2 public health emergency.  Safety protocols were in place, including screening questions prior to the visit, additional usage of staff PPE, and extensive cleaning of exam room while observing appropriate contact time as indicated for disinfecting solutions.    CC/Reason for Visit: ED follow-up  HPI: Megan Good is a 69 y.o. female who is coming in today for the above mentioned reasons.  She was seen in the emergency department on October 21 due to acute onset vision changes.  She had a CT scan of the head that was negative, she was noticed to have elevated blood pressure and it was thought that her vision changes may have been due to hypertensive emergency.  She is not known to have hypertension.  She has follow-up with neurologist, referral to ophthalmology has been made.  Blood pressure is normal at today's visit at 120/50.  She was started on statin due to an LDL cholesterol of 161.  Since starting it about 1 month ago she has noticed significant myalgias particularly of her thighs and arms.   Past Medical/Surgical History: Past Medical History:  Diagnosis Date  . Headache     Past Surgical History:  Procedure Laterality Date  . BREAST BIOPSY Left 2003  . TONSILLECTOMY      Social History:  reports that she has never smoked. She has never used smokeless tobacco. She reports that she does not drink alcohol and does not use drugs.  Allergies: Allergies  Allergen Reactions  . Penicillins Swelling and Rash    Has patient had a PCN reaction causing immediate rash, facial/tongue/throat swelling, SOB or lightheadedness with hypotension: No Has patient had a PCN reaction causing severe rash involving mucus membranes or skin necrosis: No Has patient had a PCN reaction that required hospitalization: No Has patient had a PCN reaction occurring within the last 10 years: No If all of the above  answers are "NO", then may proceed with Cephalosporin use.  Burn-like rash    Family History:  Family History  Problem Relation Age of Onset  . Healthy Mother 8  . Other Father        MVA  . Stroke Maternal Grandmother      Current Outpatient Medications:  .  aspirin EC 81 MG tablet, Take 81 mg by mouth daily. Swallow whole., Disp: , Rfl:  .  atorvastatin (LIPITOR) 20 MG tablet, Take 1 tablet (20 mg total) by mouth every other day., Disp: 90 tablet, Rfl: 1 .  cholecalciferol (VITAMIN D3) 25 MCG (1000 UNIT) tablet, Take 1,000 Units by mouth daily., Disp: , Rfl:  .  clopidogrel (PLAVIX) 75 MG tablet, Take 1 tablet (75 mg total) by mouth daily., Disp: 20 tablet, Rfl: 0 .  gabapentin (NEURONTIN) 100 MG capsule, Take 1 capsule (100 mg total) by mouth 2 (two) times daily. (Patient taking differently: Take 200 mg by mouth at bedtime. ), Disp: 60 capsule, Rfl: 0 .  Multiple Vitamins-Minerals (MULTIVITAMIN ADULTS PO), Take 1 tablet by mouth daily., Disp: , Rfl:  .  Multiple Vitamins-Minerals (ZINC PO), Take 1 tablet by mouth daily., Disp: , Rfl:  .  Potassium 99 MG TABS, Take 1 tablet by mouth daily., Disp: , Rfl:   Review of Systems:  Constitutional: Denies fever, chills, diaphoresis, appetite change and fatigue.  HEENT: Denies photophobia, eye pain, redness, hearing loss, ear pain, congestion, sore throat, rhinorrhea, sneezing, mouth sores, trouble swallowing, neck pain,  neck stiffness and tinnitus.   Respiratory: Denies SOB, DOE, cough, chest tightness,  and wheezing.   Cardiovascular: Denies chest pain, palpitations and leg swelling.  Gastrointestinal: Denies nausea, vomiting, abdominal pain, diarrhea, constipation, blood in stool and abdominal distention.  Genitourinary: Denies dysuria, urgency, frequency, hematuria, flank pain and difficulty urinating.  Endocrine: Denies: hot or cold intolerance, sweats, changes in hair or nails, polyuria, polydipsia. Musculoskeletal: Denies  back  pain, joint swelling, arthralgias and gait problem.  Skin: Denies pallor, rash and wound.  Neurological: Denies dizziness, seizures, syncope, weakness, light-headedness, numbness and headaches.  Hematological: Denies adenopathy. Easy bruising, personal or family bleeding history  Psychiatric/Behavioral: Denies suicidal ideation, mood changes, confusion, nervousness, sleep disturbance and agitation    Physical Exam: Vitals:   04/19/20 1345  BP: (!) 120/50  Pulse: 99  Temp: 98.3 F (36.8 C)  TempSrc: Oral  SpO2: (!) 84%  Weight: 142 lb 6.4 oz (64.6 kg)    Body mass index is 25.23 kg/m.  Constitutional: NAD, calm, comfortable Eyes: PERRL, lids and conjunctivae normal ENMT: Mucous membranes are moist.  Respiratory: clear to auscultation bilaterally, no wheezing, no crackles. Normal respiratory effort. No accessory muscle use.  Cardiovascular: Regular rate and rhythm, no murmurs / rubs / gallops. No extremity edema.  Neurologic: Grossly intact and nonfocal Psychiatric: Normal judgment and insight. Alert and oriented x 3. Normal mood.    Impression and Plan:  Hospital discharge follow-up  Vision changes -She has follow-up with ophthalmology soon, vision changes have completely resolved.  Could have been due to elevated blood pressure at the time although pressure is normal at visit today.  Elevated BP without diagnosis of hypertension -Blood pressure normal in hospital today.  Hyperlipidemia, unspecified hyperlipidemia type Myalgia due to statin -Advised her to try taking the statin 3 days a week instead of weekly to see if this improves her muscle aches.   Patient Instructions  -Nice seeing you today!!  -May take atorvastatin 3 times a week to see if that helps with your muscle aches.  -Make sure you schedule follow up with your eye doctor.     Chaya Jan, MD [facility]

## 2020-04-19 NOTE — Patient Instructions (Signed)
-  Nice seeing you today!!  -May take atorvastatin 3 times a week to see if that helps with your muscle aches.  -Make sure you schedule follow up with your eye doctor.

## 2020-05-15 ENCOUNTER — Encounter: Payer: Self-pay | Admitting: Internal Medicine

## 2020-05-15 DIAGNOSIS — Z961 Presence of intraocular lens: Secondary | ICD-10-CM | POA: Diagnosis not present

## 2020-05-15 DIAGNOSIS — H17821 Peripheral opacity of cornea, right eye: Secondary | ICD-10-CM | POA: Diagnosis not present

## 2020-05-15 DIAGNOSIS — H04123 Dry eye syndrome of bilateral lacrimal glands: Secondary | ICD-10-CM | POA: Diagnosis not present

## 2020-05-22 ENCOUNTER — Other Ambulatory Visit: Payer: Self-pay | Admitting: Family Medicine

## 2020-05-22 DIAGNOSIS — E785 Hyperlipidemia, unspecified: Secondary | ICD-10-CM

## 2020-05-29 ENCOUNTER — Ambulatory Visit: Payer: Medicare PPO | Admitting: Family Medicine

## 2020-05-29 ENCOUNTER — Encounter: Payer: Self-pay | Admitting: Family Medicine

## 2020-05-29 ENCOUNTER — Other Ambulatory Visit: Payer: Self-pay

## 2020-05-29 VITALS — BP 136/70 | HR 105 | Resp 16 | Ht 63.0 in | Wt 140.2 lb

## 2020-05-29 DIAGNOSIS — F419 Anxiety disorder, unspecified: Secondary | ICD-10-CM | POA: Diagnosis not present

## 2020-05-29 DIAGNOSIS — E785 Hyperlipidemia, unspecified: Secondary | ICD-10-CM

## 2020-05-29 DIAGNOSIS — I672 Cerebral atherosclerosis: Secondary | ICD-10-CM

## 2020-05-29 DIAGNOSIS — R002 Palpitations: Secondary | ICD-10-CM | POA: Diagnosis not present

## 2020-05-29 DIAGNOSIS — W19XXXA Unspecified fall, initial encounter: Secondary | ICD-10-CM

## 2020-05-29 DIAGNOSIS — T148XXA Other injury of unspecified body region, initial encounter: Secondary | ICD-10-CM

## 2020-05-29 DIAGNOSIS — M7918 Myalgia, other site: Secondary | ICD-10-CM

## 2020-05-29 DIAGNOSIS — R200 Anesthesia of skin: Secondary | ICD-10-CM | POA: Diagnosis not present

## 2020-05-29 DIAGNOSIS — R202 Paresthesia of skin: Secondary | ICD-10-CM | POA: Diagnosis not present

## 2020-05-29 MED ORDER — GABAPENTIN 100 MG PO CAPS: 200.0000 mg | ORAL_CAPSULE | Freq: Every day | ORAL | 0 refills | Status: DC

## 2020-05-29 MED ORDER — ROSUVASTATIN CALCIUM 10 MG PO TABS: 10.0000 mg | ORAL_TABLET | Freq: Every day | ORAL | 1 refills | Status: DC

## 2020-05-29 NOTE — Progress Notes (Signed)
ACUTE VISIT Chief Complaint  Patient presents with  . Fall    Happened at [employer]. Pain in under both arms, bruise on upper outer thigh, pain in back.    HPI: [name] is a 69 y.o. female who is here today with above complaint. States that her chronic arthralgias and pain were greatly improved until recent fall. On 05/26/20 around 2: 30 pm she was walking down the aisle, when a [employer] worker turned around "bumped" on her and "knocked her down." Cervical , shoulders,mid back,right hip,and right knee pain. States that she has had left shoulder pain but right shoulder pain is new after fall.  Hx of cervical DDD. LUE pain, she has not noted numbness, tingling or burning.  Bruise on right hip and knee. No limitation of ROM.  -Requesting refills on Gabapentin. Numbness and tingling on feet , chronic but worse since fall. She has felt better for the past couple months, so discontinued Gabapentin.  She has a few other issues she would like to discussed. -States that she was sent to the ER  because blurry vision, "foggy.",it, which cleared up.  Head imaging negative for stroke but there were some abnormalities, which she would like to discuss today. Lab Results  Component Value Date   WBC 6.8 03/30/2020   HGB 12.5 03/30/2020   HCT 37.2 03/30/2020   MCV 85.1 03/30/2020   PLT 165 03/30/2020    Brain MRI on 03/30/20:Negative brain MRI for age. No acute intracranial infarct or other abnormality. CTA neck: 1. The common carotid, internal carotid and vertebral arteries are patent within the neck without hemodynamically significant stenosis. 2. Somewhat beaded irregularity of the distal V2 right vertebral artery, which may be atherosclerotic in etiology. Fibromuscular dysplasia can also have this imaging appearance.   CTA head: 1. Intracranial atherosclerotic disease with multifocal stenoses, most notably as follows. 2. Moderate/severe stenosis within the  right posterior cerebral artery at the P2/P3 junction. Additionally, there is significant atherosclerotic irregularity of the P4 posterior cerebral arteries bilaterally. 3. Moderate focal stenosis within the inferior division proximal M2 right MCA branch. 4. Moderate segmental stenosis of the A2 left ACA. 5. 1-2 mm vascular protrusion arising from the cavernous left ICA, which may reflect a tiny aneurysm versus atherosclerotic Irregularity.  She completed 20 days of Plavix 75 mg daily and currently on Aspirin.  She was referred to neurologiest,evaluated on 04/05/20. According to pt ophthalmologic evaluation was recommended and done.  -Evaluated at home by EMS because of episode palpitations, shaking ,and mild nausea. She had an EKG done, she brought copy of EKG with her. Dx;ed with anxiety.  Negative for chest pain, dyspnea, diaphoresis, or syncope. Feeling anxious, concerned about her health.   -Requesting refills for atorvastatin 20 mg, which she takes 3 times per day because legs ache. She has not tried Crestor, sent to her pharmacy a few days ago.  Lab Results  Component Value Date   CHOL 242 (H) 03/09/2020   HDL 65 03/09/2020   LDLCALC 161 (H) 03/09/2020   TRIG 61 03/09/2020   CHOLHDL 3.7 03/09/2020    Review of Systems  Constitutional: Positive for fatigue. Negative for activity change, appetite change and fever.  HENT: Negative for mouth sores, nosebleeds and sore throat.   Eyes: Negative for redness and visual disturbance.  Respiratory: Negative for cough and wheezing.   Gastrointestinal: Negative for abdominal pain and vomiting.       Negative for changes in bowel habits.  Genitourinary:  Negative for decreased urine volume, dysuria and hematuria.  Musculoskeletal: Positive for arthralgias and myalgias.  Neurological: Negative for syncope, facial asymmetry and weakness.  Psychiatric/Behavioral: Negative for confusion. The patient is nervous/anxious.    Current  Outpatient Medications on File Prior to Visit  Medication Sig Dispense Refill  . aspirin EC 81 MG tablet Take 81 mg by mouth daily. Swallow whole.    . cholecalciferol (VITAMIN D3) 25 MCG (1000 UNIT) tablet Take 1,000 Units by mouth daily.    . Multiple Vitamins-Minerals (MULTIVITAMIN ADULTS PO) Take 1 tablet by mouth daily.    . Multiple Vitamins-Minerals (ZINC PO) Take 1 tablet by mouth daily.    . Potassium 99 MG TABS Take 1 tablet by mouth daily.    . [DISCONTINUED] pantoprazole (PROTONIX) 20 MG tablet Take 1 tablet (20 mg total) by mouth daily. (Patient not taking: Reported on 03/30/2020) 90 tablet 0   No current facility-administered medications on file prior to visit.    Past Medical History:  Diagnosis Date  . Headache    Allergies  Allergen Reactions  . Penicillins Swelling and Rash    Has patient had a PCN reaction causing immediate rash, facial/tongue/throat swelling, SOB or lightheadedness with hypotension: No Has patient had a PCN reaction causing severe rash involving mucus membranes or skin necrosis: No Has patient had a PCN reaction that required hospitalization: No Has patient had a PCN reaction occurring within the last 10 years: No If all of the above answers are "NO", then may proceed with Cephalosporin use.  Burn-like rash    Social History   Socioeconomic History  . Marital status: Divorced    Spouse name: Not on file  . Number of children: 1  . Years of education: Not on file  . Highest education level: Some college, no degree  Occupational History  . Occupation: [employer]IS    Employer: [employer]    Comment: retired 2019  Tobacco Use  . Smoking status: Never Smoker  . Smokeless tobacco: Never Used  Substance and Sexual Activity  . Alcohol use: Never  . Drug use: Never  . Sexual activity: Not on file  Other Topics Concern  . Not on file  Social History Narrative   Patient is right-handed. She lives alone in a 2 story  house. She occasionally drinks coffee on the weekends. She walks 2-4 miles per day weather permitting. Recently she has been unable to walk dues to headaches.   Social Determinants of Health   Financial Resource Strain: Not on file  Food Insecurity: Not on file  Transportation Needs: Not on file  Physical Activity: Not on file  Stress: Not on file  Social Connections: Not on file    Vitals:   05/29/20 0743  BP: 136/70  Pulse: (!) 105  Resp: 16  SpO2: 99%   Body mass index is 24.84 kg/m.  Physical Exam Vitals and nursing note reviewed.  Constitutional:      General: She is not in acute distress.    Appearance: She is well-developed.  HENT:     Head: Normocephalic and atraumatic.     Mouth/Throat:     Mouth: Oropharynx is clear and moist and mucous membranes are normal. Mucous membranes are moist.     Pharynx: Oropharynx is clear.  Eyes:     Conjunctiva/sclera: Conjunctivae normal.     Pupils: Pupils are equal, round, and reactive to light.  Cardiovascular:     Rate and Rhythm: Normal rate and regular  rhythm.     Pulses:          Dorsalis pedis pulses are 2+ on the right side and 2+ on the left side.     Heart sounds: No murmur heard.   Pulmonary:     Effort: Pulmonary effort is normal. No respiratory distress.     Breath sounds: Normal breath sounds.  Abdominal:     Palpations: Abdomen is soft. There is no hepatomegaly or mass.     Tenderness: There is no abdominal tenderness.  Musculoskeletal:        General: No edema.     Right hand: No bony tenderness. Normal range of motion. Normal pulse.     Left hand: Normal range of motion. Normal pulse.     Cervical back: No bony tenderness. Decreased range of motion (Marked limitation of extension and some rotation.).       Back:     Right hip: Tenderness present. No deformity or bony tenderness. Normal range of motion.     Right knee: No bony tenderness. Normal range of motion. No tenderness.     Comments: Right hip  pain with movement, not limited. Shoulders pain with movement, active ROM mildly limited. Cclicking sensation upon flexion and extension movement of a few fingers,bilateral. No tenderness upon palpation. No signs of synovitis.  Left shoulder pain with palpation lateral and biceps tendons.   Lymphadenopathy:     Cervical: No cervical adenopathy.  Skin:    General: Skin is warm.     Findings: No erythema or rash.  Neurological:     General: No focal deficit present.     Mental Status: She is alert and oriented to person, place, and time.     Cranial Nerves: No cranial nerve deficit.     Gait: Gait normal.     Deep Tendon Reflexes: Strength normal.  Psychiatric:        Mood and Affect: Mood is anxious.   ASSESSMENT AND PLAN:  Ms.Myesha was seen today for fall.  Diagnoses and all orders for this visit:  Fall, initial encounter Fall precautions discussed. I do not think imaging is needed at the time.  Musculoskeletal pain Aggravated by trauma, soft tissue. Explained that it may take a few more days for pain to go back to her baseline. ROM exercises. Caution with NSAID's. Recommend Tylenol 500 mg 3-4 times per day as needed.  Numbness and tingling of both feet Chronic. Gabapentin side effects discussed. Resume Gabapentin 200 mg at bedtime. Appropriate foot care.  -     gabapentin (NEURONTIN) 100 MG capsule; Take 2 capsules (200 mg total) by mouth at bedtime.  Hyperlipidemia, unspecified hyperlipidemia type Benefits of statin medications as well as some side effects discussed. Crestor Rx re-sent.  -     rosuvastatin (CRESTOR) 10 MG tablet; Take 1 tablet (10 mg total) by mouth daily.  Cerebral atherosclerosis Discussed results of brain imaging in detail. We discussed benefits of statin medication, she will try Crestor 10 mg daily. Continue Aspirin 81 mg daily. We discussed side effects of her medications.  Hematoma Right hip and knee. Reassured. Continue  monitoring for changes.  Palpitations Resolved. EKG reviewed: ?sinus arrhythmia,normal axis,no acute ischemia. Instructed about warning signs.  Anxiety disorder, unspecified type This problem can be contributing factors to some of her complaints. [facility] is a good option for her given her myalgias and arthralgias.  Spent 42 minutes.  During this time history was obtained and documented, examination was performed, prior labs/imaging reviewed,  and assessment/plan discussed.  Return in about 3 weeks (around 06/19/2020) for PCP.   Jaqualyn Juday G. [place], MD  [facility]. [place] office.  A few things to remember from today's visit:   Hyperlipidemia, unspecified hyperlipidemia type - Plan: rosuvastatin (CRESTOR) 10 MG tablet  Numbness and tingling of both feet - Plan: gabapentin (NEURONTIN) 100 MG capsule  Musculoskeletal pain  Cerebral atherosclerosis  Hematoma  Fall, initial encounter  We could arrange PT, let me know if yue decide to do so. Range of motion exercises. Caution with aleve, I prefer Tylenol 500 mg 3-4 times per day. Gabapentin resumed. Continue baby aspirin. Pick up Crestor. Stop Atorvastatin.  Monitor right hip and knee hematomas.   Please be sure medication list is accurate. If a new problem present, please set up appointment sooner than planned today.

## 2020-05-29 NOTE — Patient Instructions (Addendum)
A few things to remember from today's visit:   Hyperlipidemia, unspecified hyperlipidemia type - Plan: rosuvastatin (CRESTOR) 10 MG tablet  Numbness and tingling of both feet - Plan: gabapentin (NEURONTIN) 100 MG capsule  Musculoskeletal pain  Cerebral atherosclerosis  Hematoma  Fall, initial encounter  We could arrange PT, let me know if yue decide to do so. Range of motion exercises. Caution with aleve, I prefer Tylenol 500 mg 3-4 times per day. Gabapentin resumed. Continue baby aspirin. Pick up Crestor. Stop Atorvastatin.  Monitor right hip and knee hematomas.   Please be sure medication list is accurate. If a new problem present, please set up appointment sooner than planned today.

## 2020-06-16 ENCOUNTER — Telehealth: Payer: Self-pay | Admitting: Internal Medicine

## 2020-06-16 NOTE — Telephone Encounter (Signed)
Pt is calling in stating that she would like to see if Dr. Ardyth Harps would write her a letter to excuse her from jury duty.  Pt would like to have a call back to let her know if Dr. Ardyth Harps would be willing to do the letter for her and then she will bring in the jury letter so that it can be faxed and mailed in before 07/03/2020.

## 2020-06-20 NOTE — Telephone Encounter (Signed)
Ok to write letter

## 2020-06-20 NOTE — Telephone Encounter (Signed)
Letter ready for pick up and patient is aware. 

## 2020-06-20 NOTE — Telephone Encounter (Signed)
On what grounds is she requesting refusal of jury duty? Needs to be specified in the letter.

## 2020-06-20 NOTE — Telephone Encounter (Signed)
Spoke with patient and she had a fall and was seen by Dr [place] 05/29/20.  She has a problem with her "nerves",  Can't sit for a long time, and has some arm pain.  They are also asking her to go to [facility] and she does not drive.  The date for jury duty is 07/03/20 and the juror # is [phone].

## 2020-06-29 ENCOUNTER — Telehealth: Payer: Self-pay | Admitting: Internal Medicine

## 2020-06-29 DIAGNOSIS — R200 Anesthesia of skin: Secondary | ICD-10-CM

## 2020-06-29 DIAGNOSIS — R202 Paresthesia of skin: Secondary | ICD-10-CM

## 2020-06-29 MED ORDER — GABAPENTIN 100 MG PO CAPS: 200.0000 mg | ORAL_CAPSULE | Freq: Every day | ORAL | 0 refills | Status: DC

## 2020-06-29 NOTE — Telephone Encounter (Signed)
Yes

## 2020-06-29 NOTE — Telephone Encounter (Signed)
Pt is calling in needing a refill on Rx gabapentin (NEURONTIN) 100 MG  Ph[facility]:  [facility] [address].  Due to her still being in a lot of pain from her fall in [employer] last year.  Pt would like to have a call back.

## 2020-06-29 NOTE — Telephone Encounter (Signed)
Okay to fill? 

## 2020-06-29 NOTE — Addendum Note (Signed)
Addended by: Kern Reap B on: 06/29/2020 04:49 PM   Modules accepted: Orders

## 2020-06-29 NOTE — Addendum Note (Signed)
Addended by: Kern Reap B on: 06/29/2020 10:42 AM   Modules accepted: Orders

## 2020-06-29 NOTE — Telephone Encounter (Signed)
Refill sent.

## 2020-09-04 ENCOUNTER — Other Ambulatory Visit: Payer: Self-pay

## 2020-09-05 ENCOUNTER — Ambulatory Visit: Payer: Medicare PPO | Admitting: Internal Medicine

## 2020-09-05 ENCOUNTER — Encounter: Payer: Self-pay | Admitting: Internal Medicine

## 2020-09-05 VITALS — BP 110/70 | HR 76 | Temp 98.3°F | Wt 130.9 lb

## 2020-09-05 DIAGNOSIS — Z1211 Encounter for screening for malignant neoplasm of colon: Secondary | ICD-10-CM | POA: Diagnosis not present

## 2020-09-05 DIAGNOSIS — M79652 Pain in left thigh: Secondary | ICD-10-CM | POA: Diagnosis not present

## 2020-09-05 NOTE — Progress Notes (Signed)
Established Patient Office Visit     This visit occurred during the SARS-CoV-2 public health emergency.  Safety protocols were in place, including screening questions prior to the visit, additional usage of staff PPE, and extensive cleaning of exam room while observing appropriate contact time as indicated for disinfecting solutions.    CC/Reason for Visit: Discuss some acute concerns  HPI: Megan Good is a 70 y.o. female who is coming in today for the above mentioned reasons. Past Medical History is significant for: Hyperlipidemia.  She is in remarkably good physical and mental health for her age.  She has been visiting her daughter for the past 2 months.  She has many stairs in her house and she has been frequenting them a lot more.  She has started to develop some left inner lower thigh pain right above the knee.   Past Medical/Surgical History: Past Medical History:  Diagnosis Date  . Headache     Past Surgical History:  Procedure Laterality Date  . BREAST BIOPSY Left 2003  . TONSILLECTOMY      Social History:  reports that she has never smoked. She has never used smokeless tobacco. She reports that she does not drink alcohol and does not use drugs.  Allergies: Allergies  Allergen Reactions  . Penicillins Swelling and Rash    Has patient had a PCN reaction causing immediate rash, facial/tongue/throat swelling, SOB or lightheadedness with hypotension: No Has patient had a PCN reaction causing severe rash involving mucus membranes or skin necrosis: No Has patient had a PCN reaction that required hospitalization: No Has patient had a PCN reaction occurring within the last 10 years: No If all of the above answers are "NO", then may proceed with Cephalosporin use.  Burn-like rash    Family History:  Family History  Problem Relation Age of Onset  . Healthy Mother 49  . Other Father        MVA  . Stroke Maternal Grandmother      Current Outpatient  Medications:  .  aspirin EC 81 MG tablet, Take 81 mg by mouth daily. Swallow whole., Disp: , Rfl:  .  gabapentin (NEURONTIN) 100 MG capsule, Take 2 capsules (200 mg total) by mouth at bedtime., Disp: 60 capsule, Rfl: 0 .  Multiple Vitamins-Minerals (MULTIVITAMIN ADULTS PO), Take 1 tablet by mouth daily., Disp: , Rfl:  .  rosuvastatin (CRESTOR) 10 MG tablet, Take 1 tablet (10 mg total) by mouth daily., Disp: 90 tablet, Rfl: 1  Review of Systems:  Constitutional: Denies fever, chills, diaphoresis, appetite change and fatigue.  HEENT: Denies photophobia, eye pain, redness, hearing loss, ear pain, congestion, sore throat, rhinorrhea, sneezing, mouth sores, trouble swallowing, neck pain, neck stiffness and tinnitus.   Respiratory: Denies SOB, DOE, cough, chest tightness,  and wheezing.   Cardiovascular: Denies chest pain, palpitations and leg swelling.  Gastrointestinal: Denies nausea, vomiting, abdominal pain, diarrhea, constipation, blood in stool and abdominal distention.  Genitourinary: Denies dysuria, urgency, frequency, hematuria, flank pain and difficulty urinating.  Endocrine: Denies: hot or cold intolerance, sweats, changes in hair or nails, polyuria, polydipsia. Musculoskeletal: Denies myalgias, back pain, joint swelling, arthralgias and gait problem.  Skin: Denies pallor, rash and wound.  Neurological: Denies dizziness, seizures, syncope, weakness, light-headedness, numbness and headaches.  Hematological: Denies adenopathy. Easy bruising, personal or family bleeding history  Psychiatric/Behavioral: Denies suicidal ideation, mood changes, confusion, nervousness, sleep disturbance and agitation    Physical Exam: Vitals:   09/05/20 1346  BP: 110/70  Pulse: 76  Temp: 98.3 F (36.8 C)  TempSrc: Oral  SpO2: 98%  Weight: 130 lb 14.4 oz (59.4 kg)    Body mass index is 23.19 kg/m.   Constitutional: NAD, calm, comfortable Eyes: PERRL, lids and conjunctivae normal ENMT: Mucous  membranes are moist. Neck: normal, supple, no masses, no thyromegaly Respiratory: clear to auscultation bilaterally, no wheezing, no crackles. Normal respiratory effort. No accessory muscle use.  Cardiovascular: Regular rate and rhythm, no murmurs / rubs / gallops. No extremity edema.  Musculoskeletal: no clubbing / cyanosis. No joint deformity upper and lower extremities. Good ROM, no contractures. Normal muscle tone.  Skin: no rashes, lesions, ulcers. No induration Neurologic: Grossly intact and nonfocal Psychiatric: Normal judgment and insight. Alert and oriented x 3. Normal mood.    Impression and Plan:  Screening for malignant neoplasm of colon  - Plan: Ambulatory referral to Gastroenterology  Left thigh pain -Suspect due to recent increase in physical activity with going up and down stairs. -Advised to rest, warm water soaks and follow-up if continued issues.    Chaya Jan, MD [facility]

## 2020-09-12 ENCOUNTER — Ambulatory Visit: Payer: Self-pay | Admitting: Internal Medicine

## 2020-09-28 ENCOUNTER — Ambulatory Visit: Payer: Self-pay | Admitting: Internal Medicine

## 2020-10-10 ENCOUNTER — Other Ambulatory Visit (INDEPENDENT_AMBULATORY_CARE_PROVIDER_SITE_OTHER): Payer: Medicare PPO

## 2020-10-10 ENCOUNTER — Ambulatory Visit: Payer: Medicare PPO | Admitting: Internal Medicine

## 2020-10-10 ENCOUNTER — Other Ambulatory Visit: Payer: Self-pay | Admitting: Internal Medicine

## 2020-10-10 ENCOUNTER — Other Ambulatory Visit: Payer: Self-pay

## 2020-10-10 DIAGNOSIS — E785 Hyperlipidemia, unspecified: Secondary | ICD-10-CM | POA: Diagnosis not present

## 2020-10-10 LAB — LIPID PANEL
Cholesterol: 161 mg/dL (ref 0–200)
HDL: 54.9 mg/dL (ref >39.00)
LDL Cholesterol: 93 mg/dL (ref 0–99)
NonHDL: 105.82
Total CHOL/HDL Ratio: 3
Triglycerides: 66 mg/dL (ref 0.0–149.0)
VLDL: 13.2 mg/dL (ref 0.0–40.0)

## 2020-10-12 ENCOUNTER — Telehealth: Payer: Self-pay | Admitting: Internal Medicine

## 2020-10-12 NOTE — Telephone Encounter (Signed)
Patient is calling back for lab results, please advise. CB is [phone]

## 2020-10-13 NOTE — Telephone Encounter (Signed)
Patient is aware of lab results.

## 2020-10-16 ENCOUNTER — Encounter: Payer: Self-pay | Admitting: Family Medicine

## 2020-10-16 ENCOUNTER — Other Ambulatory Visit: Payer: Self-pay

## 2020-10-16 ENCOUNTER — Other Ambulatory Visit: Payer: Medicare PPO

## 2020-10-16 ENCOUNTER — Ambulatory Visit: Payer: Medicare PPO | Admitting: Family Medicine

## 2020-10-16 VITALS — BP 120/76 | HR 75 | Temp 98.2°F | Wt 127.6 lb

## 2020-10-16 DIAGNOSIS — H6123 Impacted cerumen, bilateral: Secondary | ICD-10-CM

## 2020-10-16 DIAGNOSIS — R42 Dizziness and giddiness: Secondary | ICD-10-CM

## 2020-10-16 DIAGNOSIS — G629 Polyneuropathy, unspecified: Secondary | ICD-10-CM

## 2020-10-16 DIAGNOSIS — R0602 Shortness of breath: Secondary | ICD-10-CM

## 2020-10-16 LAB — BASIC METABOLIC PANEL
BUN: 12 mg/dL (ref 6–23)
CO2: 24 mEq/L (ref 19–32)
Calcium: 9.1 mg/dL (ref 8.4–10.5)
Chloride: 106 mEq/L (ref 96–112)
Creatinine, Ser: 0.77 mg/dL (ref 0.40–1.20)
GFR: 77.75 mL/min (ref >60.00)
Glucose, Bld: 110 mg/dL — ABNORMAL HIGH (ref 70–99)
Potassium: 3.9 mEq/L (ref 3.5–5.1)
Sodium: 139 mEq/L (ref 135–145)

## 2020-10-16 LAB — CBC WITH DIFFERENTIAL/PLATELET
Basophils Absolute: 0 10*3/uL (ref 0.0–0.1)
Basophils Relative: 0.7 % (ref 0.0–3.0)
Eosinophils Absolute: 0.1 10*3/uL (ref 0.0–0.7)
Eosinophils Relative: 1.4 % (ref 0.0–5.0)
HCT: 37.1 % (ref 36.0–46.0)
Hemoglobin: 12.3 g/dL (ref 12.0–15.0)
Lymphocytes Relative: 19.5 % (ref 12.0–46.0)
Lymphs Abs: 1.2 10*3/uL (ref 0.7–4.0)
MCHC: 33.3 g/dL (ref 30.0–36.0)
MCV: 84.9 fl (ref 78.0–100.0)
Monocytes Absolute: 0.5 10*3/uL (ref 0.1–1.0)
Monocytes Relative: 7.3 % (ref 3.0–12.0)
Neutro Abs: 4.5 10*3/uL (ref 1.4–7.7)
Neutrophils Relative %: 71.1 % (ref 43.0–77.0)
Platelets: 177 10*3/uL (ref 150.0–400.0)
RBC: 4.37 Mil/uL (ref 3.87–5.11)
RDW: 13.4 % (ref 11.5–15.5)
WBC: 6.3 10*3/uL (ref 4.0–10.5)

## 2020-10-16 LAB — T4, FREE: Free T4: 0.86 ng/dL (ref 0.60–1.60)

## 2020-10-16 LAB — TSH: TSH: 0.35 u[IU]/mL (ref 0.35–4.50)

## 2020-10-16 LAB — VITAMIN B12: Vitamin B-12: >1550 pg/mL — ABNORMAL HIGH (ref 211–911)

## 2020-10-16 NOTE — Progress Notes (Signed)
Subjective:    Patient ID: Megan Good, female    DOB: [birth year], 70 y.o.   MRN: [id number]  Chief Complaint  Patient presents with  . Dizziness    Woke up about 4am and felt dizzy, had to hold the wall to walk. Still does not feel as stable but better than when it started, tries to lay down to see if it will help but feels dizzy when sitting up or standing.    HPI Patient is a 70 year old female with pmh sig subclinical hypothyroidism, HLD, neuropathy, anxiety, L shoulder pain, GERD who is followed by Dr. Ardyth Harps and seen today for acute concern.  Patient endorses waking up this morning and feeling dizzy after sitting up on the side of the bed.  Pt initially felt like the bed was moving, then she felt unstable.  Pt notes improvement in symptoms since this a.m. but still not quite right.  Patient also had SOB which has since resolved.  Pt note h/o anxiety.  Inquires about her thyroid.  Denies changes in medications.  Drinking 8 glasses of water per day.  Past Medical History:  Diagnosis Date  . Headache     Allergies  Allergen Reactions  . Penicillins Swelling and Rash    Has patient had a PCN reaction causing immediate rash, facial/tongue/throat swelling, SOB or lightheadedness with hypotension: No Has patient had a PCN reaction causing severe rash involving mucus membranes or skin necrosis: No Has patient had a PCN reaction that required hospitalization: No Has patient had a PCN reaction occurring within the last 10 years: No If all of the above answers are "NO", then may proceed with Cephalosporin use.  Burn-like rash    ROS General: Denies fever, chills, night sweats, changes in weight, changes in appetite  +dizziness HEENT: Denies headaches, ear pain, changes in vision, rhinorrhea, sore throat CV: Denies CP, palpitations, SOB, orthopnea Pulm: Denies SOB, cough, wheezing GI: Denies abdominal pain, nausea, vomiting, diarrhea, constipation GU: Denies dysuria,  hematuria, frequency, vaginal discharge Msk: Denies muscle cramps, joint pains Neuro: Denies weakness, numbness, tingling +unstable balance Skin: Denies rashes, bruising Psych: Denies depression, anxiety, hallucinations     Objective:    Blood pressure 120/76, pulse 75, temperature 98.2 F (36.8 C), temperature source Oral, weight 127 lb 9.6 oz (57.9 kg), SpO2 95 %.  Gen. Pleasant, well-nourished, in no distress, normal affect   HEENT: [place]/AT, face symmetric, conjunctiva clear, no scleral icterus, PERRLA, nystagmus with left gaze, left ocular muscles weaker than right.  Left eye having difficulty tracking medially.  Nares patent without drainage.  Bilateral canals impacted with cerumen.  Ears irrigated.  Patient tolerated procedure well.  TMs normal after irrigation. Neck: No JVD, no thyromegaly, no carotid bruits Lungs: no accessory muscle use, CTAB, no wheezes or rales Cardiovascular: RRR, no m/r/g, no peripheral edema Musculoskeletal: No deformities, no cyanosis or clubbing, normal tone Neuro:  A&Ox3, CN II-XII intact, normal gait Skin:  Warm, no lesions/ rash   Wt Readings from Last 3 Encounters:  10/16/20 127 lb 9.6 oz (57.9 kg)  09/05/20 130 lb 14.4 oz (59.4 kg)  05/29/20 140 lb 4 oz (63.6 kg)    Lab Results  Component Value Date   WBC 6.8 03/30/2020   HGB 12.5 03/30/2020   HCT 37.2 03/30/2020   PLT 165 03/30/2020   GLUCOSE 106 (H) 03/30/2020   CHOL 161 10/10/2020   TRIG 66.0 10/10/2020   HDL 54.90 10/10/2020   LDLCALC 93 10/10/2020   ALT 19 03/30/2020  AST 25 03/30/2020   NA 140 03/30/2020   K 3.6 03/30/2020   CL 106 03/30/2020   CREATININE 0.80 03/30/2020   BUN 9 03/30/2020   CO2 24 03/30/2020   TSH 0.68 03/09/2020   HGBA1C 5.3 08/11/2019    Assessment/Plan:  Vertigo  -discussed various causes -Can try OTC eardrops to aid with vertigo symptoms. -Hydration also encouraged -will obtain labs -Vision exam recommended -For continued symptoms Antivert 12.5  mg - Plan: Basic metabolic panel, TSH, T4, Free, Hemoglobin A1c  Bilateral impacted cerumen -possibly contributing to dizziness/unstable feeling -Consent obtained.  Bilateral ears irrigated.  Patient tolerated procedure. -OTC Debrox eardrops -Given handout  SOB (shortness of breath) -resolved since this am -possible 2/2 anxiety -Given strict precautions - Plan: CBC with Differential/Platelet, TSH, T4, Free  F/u in 3 days with pcp for CPE  Abbe Amsterdam, MD

## 2020-10-16 NOTE — Patient Instructions (Addendum)
You can use Debrox eardrops which can be found over-the-counter to help with your earwax.  Nutri biotic eardrops with grapefruit seed oil extract can also be helpful for vertigo.  He can be found at your local health food store or online.  Vertigo Vertigo is the feeling that you or your surroundings are moving when they are not. This feeling can come and go at any time. Vertigo often goes away on its own. Vertigo can be dangerous if it occurs while you are doing something that could endanger you or others, such as driving or operating machinery. Your health care provider will do tests to determine the cause of your vertigo. Tests will also help your health care provider decide how best to treat your condition. Follow these instructions at home: Eating and drinking  Drink enough fluid to keep your urine pale yellow.  Do not drink alcohol.      Activity  Return to your normal activities as told by your health care provider. Ask your health care provider what activities are safe for you.  In the morning, first sit up on the side of the bed. When you feel okay, stand slowly while you hold onto something until you know that your balance is fine.  Move slowly. Avoid sudden body or head movements or certain positions, as told by your health care provider.  If you have trouble walking or keeping your balance, try using a cane for stability. If you feel dizzy or unstable, sit down right away.  Avoid doing any tasks that would cause danger to you or others if vertigo occurs.  Avoid bending down if you feel dizzy. Place items in your home so that they are easy for you to reach without leaning over.  Do not drive or use heavy machinery if you feel dizzy. General instructions  Take over-the-counter and prescription medicines only as told by your health care provider.  Keep all follow-up visits as told by your health care provider. This is important. Contact a health care provider if:  Your  medicines do not relieve your vertigo or they make it worse.  You have a fever.  Your condition gets worse or you develop new symptoms.  Your family or friends notice any behavioral changes.  Your nausea or vomiting gets worse.  You have numbness or a prickling and tingling sensation in part of your body. Get help right away if you:  Have difficulty moving or speaking.  Are always dizzy.  Faint.  Develop severe headaches.  Have weakness in your hands, arms, or legs.  Have changes in your hearing or vision.  Develop a stiff neck.  Develop sensitivity to light. Summary  Vertigo is the feeling that you or your surroundings are moving when they are not.  Your health care provider will do tests to determine the cause of your vertigo.  Follow instructions for home care. You may be told to avoid certain tasks, positions, or movements.  Contact a health care provider if your medicines do not relieve your symptoms, or if you have a fever, nausea, vomiting, or changes in behavior.  Get help right away if you have severe headaches or difficulty speaking, or you develop hearing or vision problems. This information is not intended to replace advice given to you by your health care provider. Make sure you discuss any questions you have with your health care provider. Document Revised: 04/20/2018 Document Reviewed: 04/20/2018 Elsevier Patient Education  2021 [employer]  Earwax Buildup, Adult The  ears produce a substance called earwax that helps keep bacteria out of the ear and protects the skin in the ear canal. Occasionally, earwax can build up in the ear and cause discomfort or hearing loss. What are the causes? This condition is caused by a buildup of earwax. Ear canals are self-cleaning. Ear wax is made in the outer part of the ear canal and generally falls out in small amounts over time. When the self-cleaning mechanism is not working, earwax builds up and can cause decreased  hearing and discomfort. Attempting to clean ears with cotton swabs can push the earwax deep into the ear canal and cause decreased hearing and pain. What increases the risk? This condition is more likely to develop in people who:  Clean their ears often with cotton swabs.  Pick at their ears.  Use earplugs or in-ear headphones often, or wear hearing aids. The following factors may also make you more likely to develop this condition:  Being female.  Being of older age.  Naturally producing more earwax.  Having narrow ear canals.  Having earwax that is overly thick or sticky.  Having excess hair in the ear canal.  Having eczema.  Being dehydrated. What are the signs or symptoms? Symptoms of this condition include:  Reduced or muffled hearing.  A feeling of fullness in the ear or feeling that the ear is plugged.  Fluid coming from the ear.  Ear pain or an itchy ear.  Ringing in the ear.  Coughing.  Balance problems.  An obvious piece of earwax that can be seen inside the ear canal. How is this diagnosed? This condition may be diagnosed based on:  Your symptoms.  Your medical history.  An ear exam. During the exam, your health care provider will look into your ear with an instrument called an otoscope. You may have tests, including a hearing test. How is this treated? This condition may be treated by:  Using ear drops to soften the earwax.  Having the earwax removed by a health care provider. The health care provider may: ? Flush the ear with water. ? Use an instrument that has a loop on the end (curette). ? Use a suction device.  Having surgery to remove the wax buildup. This may be done in severe cases. Follow these instructions at home:  Take over-the-counter and prescription medicines only as told by your health care provider.  Do not put any objects, including cotton swabs, into your ear. You can clean the opening of your ear canal with a washcloth  or facial tissue.  Follow instructions from your health care provider about cleaning your ears. Do not overclean your ears.  Drink enough fluid to keep your urine pale yellow. This will help to thin the earwax.  Keep all follow-up visits as told. If earwax builds up in your ears often or if you use hearing aids, consider seeing your health care provider for routine, preventive ear cleanings. Ask your health care provider how often you should schedule your cleanings.  If you have hearing aids, clean them according to instructions from the manufacturer and your health care provider.   Contact a health care provider if:  You have ear pain.  You develop a fever.  You have pus or other fluid coming from your ear.  You have hearing loss.  You have ringing in your ears that does not go away.  You feel like the room is spinning (vertigo).  Your symptoms do not improve with treatment.  Get help right away if:  You have bleeding from the affected ear.  You have severe ear pain. Summary  Earwax can build up in the ear and cause discomfort or hearing loss.  The most common symptoms of this condition include reduced or muffled hearing, a feeling of fullness in the ear, or feeling that the ear is plugged.  This condition may be diagnosed based on your symptoms, your medical history, and an ear exam.  This condition may be treated by using ear drops to soften the earwax or by having the earwax removed by a health care provider.  Do not put any objects, including cotton swabs, into your ear. You can clean the opening of your ear canal with a washcloth or facial tissue. This information is not intended to replace advice given to you by your health care provider. Make sure you discuss any questions you have with your health care provider. Document Revised: 09/14/2019 Document Reviewed: 09/14/2019 Elsevier Patient Education  2021 [employer].

## 2020-10-17 LAB — HEMOGLOBIN A1C
Hgb A1c MFr Bld: 5.6 % of total Hgb (ref <5.7)
Mean Plasma Glucose: 114 mg/dL
eAG (mmol/L): 6.3 mmol/L

## 2020-10-18 ENCOUNTER — Other Ambulatory Visit: Payer: Self-pay

## 2020-10-18 NOTE — Progress Notes (Signed)
Spoke with patient, is aware. 

## 2020-10-19 ENCOUNTER — Ambulatory Visit (INDEPENDENT_AMBULATORY_CARE_PROVIDER_SITE_OTHER): Payer: Medicare PPO | Admitting: Internal Medicine

## 2020-10-19 ENCOUNTER — Encounter: Payer: Self-pay | Admitting: Internal Medicine

## 2020-10-19 VITALS — BP 116/72 | HR 74 | Temp 98.0°F | Ht 62.5 in | Wt 127.7 lb

## 2020-10-19 DIAGNOSIS — Z13820 Encounter for screening for osteoporosis: Secondary | ICD-10-CM

## 2020-10-19 DIAGNOSIS — Z1211 Encounter for screening for malignant neoplasm of colon: Secondary | ICD-10-CM

## 2020-10-19 DIAGNOSIS — E785 Hyperlipidemia, unspecified: Secondary | ICD-10-CM

## 2020-10-19 DIAGNOSIS — Z0000 Encounter for general adult medical examination without abnormal findings: Secondary | ICD-10-CM | POA: Diagnosis not present

## 2020-10-19 DIAGNOSIS — E0590 Thyrotoxicosis, unspecified without thyrotoxic crisis or storm: Secondary | ICD-10-CM

## 2020-10-19 DIAGNOSIS — Z780 Asymptomatic menopausal state: Secondary | ICD-10-CM

## 2020-10-19 DIAGNOSIS — R200 Anesthesia of skin: Secondary | ICD-10-CM

## 2020-10-19 DIAGNOSIS — K219 Gastro-esophageal reflux disease without esophagitis: Secondary | ICD-10-CM

## 2020-10-19 DIAGNOSIS — R202 Paresthesia of skin: Secondary | ICD-10-CM

## 2020-10-19 MED ORDER — GABAPENTIN 100 MG PO CAPS: 100.0000 mg | ORAL_CAPSULE | Freq: Every day | ORAL | 1 refills | Status: DC

## 2020-10-19 NOTE — Patient Instructions (Signed)
-Nice seeing you today!!  -Remember to schedule your mammogram.  -Please obtain your shingles vaccine and your immunization records from your pharmacy.  -Schedule follow up in 6 months or sooner as needed.   Preventive Care 71 Years and Older, Female Preventive care refers to lifestyle choices and visits with your health care provider that can promote health and wellness. This includes:  A yearly physical exam. This is also called an annual wellness visit.  Regular dental and eye exams.  Immunizations.  Screening for certain conditions.  Healthy lifestyle choices, such as: ? Eating a healthy diet. ? Getting regular exercise. ? Not using drugs or products that contain nicotine and tobacco. ? Limiting alcohol use. What can I expect for my preventive care visit? Physical exam Your health care provider will check your:  Height and weight. These may be used to calculate your BMI (body mass index). BMI is a measurement that tells if you are at a healthy weight.  Heart rate and blood pressure.  Body temperature.  Skin for abnormal spots. Counseling Your health care provider may ask you questions about your:  Past medical problems.  Family's medical history.  Alcohol, tobacco, and drug use.  Emotional well-being.  Home life and relationship well-being.  Sexual activity.  Diet, exercise, and sleep habits.  History of falls.  Memory and ability to understand (cognition).  Work and work Statistician.  Pregnancy and menstrual history.  Access to firearms. What immunizations do I need? Vaccines are usually given at various ages, according to a schedule. Your health care provider will recommend vaccines for you based on your age, medical history, and lifestyle or other factors, such as travel or where you work.   What tests do I need? Blood tests  Lipid and cholesterol levels. These may be checked every 5 years, or more often depending on your overall  health.  Hepatitis C test.  Hepatitis B test. Screening  Lung cancer screening. You may have this screening every year starting at age 28 if you have a 30-pack-year history of smoking and currently smoke or have quit within the past 15 years.  Colorectal cancer screening. ? All adults should have this screening starting at age 75 and continuing until age 37. ? Your health care provider may recommend screening at age 24 if you are at increased risk. ? You will have tests every 1-10 years, depending on your results and the type of screening test.  Diabetes screening. ? This is done by checking your blood sugar (glucose) after you have not eaten for a while (fasting). ? You may have this done every 1-3 years.  Mammogram. ? This may be done every 1-2 years. ? Talk with your health care provider about how often you should have regular mammograms.  Abdominal aortic aneurysm (AAA) screening. You may need this if you are a current or former smoker.  BRCA-related cancer screening. This may be done if you have a family history of breast, ovarian, tubal, or peritoneal cancers. Other tests  STD (sexually transmitted disease) testing, if you are at risk.  Bone density scan. This is done to screen for osteoporosis. You may have this done starting at age 59. Talk with your health care provider about your test results, treatment options, and if necessary, the need for more tests. Follow these instructions at home: Eating and drinking  Eat a diet that includes fresh fruits and vegetables, whole grains, lean protein, and low-fat dairy products. Limit your intake of foods with high  amounts of sugar, saturated fats, and salt.  Take vitamin and mineral supplements as recommended by your health care provider.  Do not drink alcohol if your health care provider tells you not to drink.  If you drink alcohol: ? Limit how much you have to 0-1 drink a day. ? Be aware of how much alcohol is in your  drink. In the [place] one drink equals one 12 oz bottle of beer (355 mL), one 5 oz glass of wine (148 mL), or one 1 oz glass of hard liquor (44 mL).   Lifestyle  Take daily care of your teeth and gums. Brush your teeth every morning and night with fluoride toothpaste. Floss one time each day.  Stay active. Exercise for at least 30 minutes 5 or more days each week.  Do not use any products that contain nicotine or tobacco, such as cigarettes, e-cigarettes, and chewing tobacco. If you need help quitting, ask your health care provider.  Do not use drugs.  If you are sexually active, practice safe sex. Use a condom or other form of protection in order to prevent STIs (sexually transmitted infections).  Talk with your health care provider about taking a low-dose aspirin or statin.  Find healthy ways to cope with stress, such as: ? Meditation, yoga, or listening to music. ? Journaling. ? Talking to a trusted person. ? Spending time with friends and family. Safety  Always wear your seat belt while driving or riding in a vehicle.  Do not drive: ? If you have been drinking alcohol. Do not ride with someone who has been drinking. ? When you are tired or distracted. ? While texting.  Wear a helmet and other protective equipment during sports activities.  If you have firearms in your house, make sure you follow all gun safety procedures. What's next?  Visit your health care provider once a year for an annual wellness visit.  Ask your health care provider how often you should have your eyes and teeth checked.  Stay up to date on all vaccines. This information is not intended to replace advice given to you by your health care provider. Make sure you discuss any questions you have with your health care provider. Document Revised: 05/17/2020 Document Reviewed: 05/21/2018 Elsevier Patient Education  2021 [employer].

## 2020-10-19 NOTE — Progress Notes (Signed)
Established Patient Office Visit     This visit occurred during the SARS-CoV-2 public health emergency.  Safety protocols were in place, including screening questions prior to the visit, additional usage of staff PPE, and extensive cleaning of exam room while observing appropriate contact time as indicated for disinfecting solutions.    CC/Reason for Visit: Annual preventive exam and subsequent Medicare wellness visit  HPI: Megan Good is a 70 y.o. female who is coming in today for the above mentioned reasons. Past Medical History is significant for: Hyperlipidemia, peripheral neuropathy, history of subclinical hyperthyroidism and GERD.  She has been doing well.  She stopped taking her gabapentin a long time ago but has noticed some numbness and tingling of her legs.  She is due for pneumonia vaccine, shingles vaccine, she has had 4 COVID vaccines.  She is overdue for colonoscopy, mammogram and DEXA scan.  She has routine eye and dental care.   Past Medical/Surgical History: Past Medical History:  Diagnosis Date  . Headache     Past Surgical History:  Procedure Laterality Date  . BREAST BIOPSY Left 2003  . TONSILLECTOMY      Social History:  reports that she has never smoked. She has never used smokeless tobacco. She reports that she does not drink alcohol and does not use drugs.  Allergies: Allergies  Allergen Reactions  . Penicillins Swelling and Rash    Has patient had a PCN reaction causing immediate rash, facial/tongue/throat swelling, SOB or lightheadedness with hypotension: No Has patient had a PCN reaction causing severe rash involving mucus membranes or skin necrosis: No Has patient had a PCN reaction that required hospitalization: No Has patient had a PCN reaction occurring within the last 10 years: No If all of the above answers are "NO", then may proceed with Cephalosporin use.  Burn-like rash    Family History:  Family History  Problem Relation  Age of Onset  . Healthy Mother 70  . Other Father        MVA  . Stroke Maternal Grandmother      Current Outpatient Medications:  .  aspirin EC 81 MG tablet, Take 81 mg by mouth daily. Swallow whole., Disp: , Rfl:  .  gabapentin (NEURONTIN) 100 MG capsule, Take 1 capsule (100 mg total) by mouth at bedtime., Disp: 90 capsule, Rfl: 1 .  Multiple Vitamins-Minerals (MULTIVITAMIN ADULTS PO), Take 1 tablet by mouth daily., Disp: , Rfl:  .  rosuvastatin (CRESTOR) 10 MG tablet, Take 1 tablet (10 mg total) by mouth daily., Disp: 90 tablet, Rfl: 1  Review of Systems:  Constitutional: Denies fever, chills, diaphoresis, appetite change and fatigue.  HEENT: Denies photophobia, eye pain, redness, hearing loss, ear pain, congestion, sore throat, rhinorrhea, sneezing, mouth sores, trouble swallowing, neck pain, neck stiffness and tinnitus.   Respiratory: Denies SOB, DOE, cough, chest tightness,  and wheezing.   Cardiovascular: Denies chest pain, palpitations and leg swelling.  Gastrointestinal: Denies nausea, vomiting, abdominal pain, diarrhea, constipation, blood in stool and abdominal distention.  Genitourinary: Denies dysuria, urgency, frequency, hematuria, flank pain and difficulty urinating.  Endocrine: Denies: hot or cold intolerance, sweats, changes in hair or nails, polyuria, polydipsia. Musculoskeletal: Denies myalgias, back pain, joint swelling, arthralgias and gait problem.  Skin: Denies pallor, rash and wound.  Neurological: Denies dizziness, seizures, syncope, weakness, light-headedness, numbness and headaches.  Hematological: Denies adenopathy. Easy bruising, personal or family bleeding history  Psychiatric/Behavioral: Denies suicidal ideation, mood changes, confusion, nervousness, sleep disturbance and agitation  Physical Exam: Vitals:   10/19/20 1114  BP: 116/72  Pulse: 74  Temp: 98 F (36.7 C)  TempSrc: Oral  SpO2: 99%  Weight: 127 lb 11.2 oz (57.9 kg)  Height: 5' 2.5"  (1.588 m)    Body mass index is 22.98 kg/m.   Constitutional: NAD, calm, comfortable Eyes: PERRL, lids and conjunctivae normal ENMT: Mucous membranes are moist. Posterior pharynx clear of any exudate or lesions. Normal dentition. Tympanic membrane is pearly white, no erythema or bulging. Neck: normal, supple, no masses, no thyromegaly Respiratory: clear to auscultation bilaterally, no wheezing, no crackles. Normal respiratory effort. No accessory muscle use.  Cardiovascular: Regular rate and rhythm, no murmurs / rubs / gallops. No extremity edema. 2+ pedal pulses. No carotid bruits.  Abdomen: no tenderness, no masses palpated. No hepatosplenomegaly. Bowel sounds positive.  Musculoskeletal: no clubbing / cyanosis. No joint deformity upper and lower extremities. Good ROM, no contractures. Normal muscle tone.  Skin: no rashes, lesions, ulcers. No induration Neurologic: CN 2-12 grossly intact. Sensation intact, DTR normal. Strength 5/5 in all 4.  Psychiatric: Normal judgment and insight. Alert and oriented x 3. Normal mood.    Subsequent Medicare wellness visit   1. Risk factors, based on past  M,S,F -cardiovascular disease risk factors include age, history of hyperlipidemia   2.  Physical activities: She is very physically active   3.  Depression/mood:  Stable, not depressed   4.  Hearing:  No perceived issues   5.  ADL's: Independent in all ADLs   6.  Fall risk:  Low fall risk   7.  Home safety: No problems identified   8.  Height weight, and visual acuity: height and weight as above, vision:   Visual Acuity Screening   Right eye Left eye Both eyes  Without correction: '20/20 20/20 20/20 '  With correction:        9.  Counseling:  Advise she update her vaccination status   10. Lab orders based on risk factors: Laboratory update will be reviewed   11. Referral :  None today   12. Care plan:  Follow-up with me in 6 months   13. Cognitive assessment:  No cognitive  impairment   14. Screening: Patient provided with a written and personalized 5-10 year screening schedule in the AVS.   yes   15. Provider List Update:   PCP only  16. Advance Directives: Full code   17. Opioids: Patient is not on any opioid prescriptions and has no risk factors for a substance use disorder.   [facility] Office Visit from 12/07/2019 in [facility] [facility]  PHQ-9 Total Score 3      Fall Risk  10/19/2020 10/19/2020 12/07/2019  Falls in the past year? 1 1 0  Number falls in past yr: 0 - 0  Injury with Fall? 1 1 0     Impression and Plan:  Screening for malignant neoplasm of colon  - Plan: Ambulatory referral to Gastroenterology  Encounter for preventive health examination -Advised routine eye and dental care. -She is due for shingles.  She believes she has had her fourth COVID booster and her 2 pneumonia vaccines, she will get immunization records from her pharmacy. -Screening labs today. -Healthy lifestyle discussed in detail. -DEXA scan today. -She is overdue for initial screening colonoscopy, GI referral today. -She is due for mammogram this month, she will schedule.  Gastroesophageal reflux disease without esophagitis -Stable, not on daily PPI therapy.  Hyperlipidemia, unspecified hyperlipidemia type  -Recent  lipid panel showed a total cholesterol of 161, LDL of 93 and triglycerides of 66 which is a significant improvement after starting rosuvastatin.  Subclinical hyperthyroidism -Recent TSH was within normal limits.  Numbness and tingling of both feet  - Plan: gabapentin (NEURONTIN) 100 MG capsule.  She states that this has helped with her neuropathy in the past but has not been on it for months.  I will represcribe to see if it is of any benefit.  Encounter for osteoporosis screening in asymptomatic postmenopausal patient  - Plan: DG Bone Density    Patient Instructions   -Nice seeing you today!!  -Remember to schedule your  mammogram.  -Please obtain your shingles vaccine and your immunization records from your pharmacy.  -Schedule follow up in 6 months or sooner as needed.   Preventive Care 31 Years and Older, Female Preventive care refers to lifestyle choices and visits with your health care provider that can promote health and wellness. This includes:  A yearly physical exam. This is also called an annual wellness visit.  Regular dental and eye exams.  Immunizations.  Screening for certain conditions.  Healthy lifestyle choices, such as: ? Eating a healthy diet. ? Getting regular exercise. ? Not using drugs or products that contain nicotine and tobacco. ? Limiting alcohol use. What can I expect for my preventive care visit? Physical exam Your health care provider will check your:  Height and weight. These may be used to calculate your BMI (body mass index). BMI is a measurement that tells if you are at a healthy weight.  Heart rate and blood pressure.  Body temperature.  Skin for abnormal spots. Counseling Your health care provider may ask you questions about your:  Past medical problems.  Family's medical history.  Alcohol, tobacco, and drug use.  Emotional well-being.  Home life and relationship well-being.  Sexual activity.  Diet, exercise, and sleep habits.  History of falls.  Memory and ability to understand (cognition).  Work and work Statistician.  Pregnancy and menstrual history.  Access to firearms. What immunizations do I need? Vaccines are usually given at various ages, according to a schedule. Your health care provider will recommend vaccines for you based on your age, medical history, and lifestyle or other factors, such as travel or where you work.   What tests do I need? Blood tests  Lipid and cholesterol levels. These may be checked every 5 years, or more often depending on your overall health.  Hepatitis C test.  Hepatitis B test. Screening  Lung  cancer screening. You may have this screening every year starting at age 40 if you have a 30-pack-year history of smoking and currently smoke or have quit within the past 15 years.  Colorectal cancer screening. ? All adults should have this screening starting at age 64 and continuing until age 27. ? Your health care provider may recommend screening at age 37 if you are at increased risk. ? You will have tests every 1-10 years, depending on your results and the type of screening test.  Diabetes screening. ? This is done by checking your blood sugar (glucose) after you have not eaten for a while (fasting). ? You may have this done every 1-3 years.  Mammogram. ? This may be done every 1-2 years. ? Talk with your health care provider about how often you should have regular mammograms.  Abdominal aortic aneurysm (AAA) screening. You may need this if you are a current or former smoker.  BRCA-related cancer screening.  This may be done if you have a family history of breast, ovarian, tubal, or peritoneal cancers. Other tests  STD (sexually transmitted disease) testing, if you are at risk.  Bone density scan. This is done to screen for osteoporosis. You may have this done starting at age 20. Talk with your health care provider about your test results, treatment options, and if necessary, the need for more tests. Follow these instructions at home: Eating and drinking  Eat a diet that includes fresh fruits and vegetables, whole grains, lean protein, and low-fat dairy products. Limit your intake of foods with high amounts of sugar, saturated fats, and salt.  Take vitamin and mineral supplements as recommended by your health care provider.  Do not drink alcohol if your health care provider tells you not to drink.  If you drink alcohol: ? Limit how much you have to 0-1 drink a day. ? Be aware of how much alcohol is in your drink. In the [place] one drink equals one 12 oz bottle of beer (355 mL),  one 5 oz glass of wine (148 mL), or one 1 oz glass of hard liquor (44 mL).   Lifestyle  Take daily care of your teeth and gums. Brush your teeth every morning and night with fluoride toothpaste. Floss one time each day.  Stay active. Exercise for at least 30 minutes 5 or more days each week.  Do not use any products that contain nicotine or tobacco, such as cigarettes, e-cigarettes, and chewing tobacco. If you need help quitting, ask your health care provider.  Do not use drugs.  If you are sexually active, practice safe sex. Use a condom or other form of protection in order to prevent STIs (sexually transmitted infections).  Talk with your health care provider about taking a low-dose aspirin or statin.  Find healthy ways to cope with stress, such as: ? Meditation, yoga, or listening to music. ? Journaling. ? Talking to a trusted person. ? Spending time with friends and family. Safety  Always wear your seat belt while driving or riding in a vehicle.  Do not drive: ? If you have been drinking alcohol. Do not ride with someone who has been drinking. ? When you are tired or distracted. ? While texting.  Wear a helmet and other protective equipment during sports activities.  If you have firearms in your house, make sure you follow all gun safety procedures. What's next?  Visit your health care provider once a year for an annual wellness visit.  Ask your health care provider how often you should have your eyes and teeth checked.  Stay up to date on all vaccines. This information is not intended to replace advice given to you by your health care provider. Make sure you discuss any questions you have with your health care provider. Document Revised: 05/17/2020 Document Reviewed: 05/21/2018 Elsevier Patient Education  2021 [place], [place] [facility]

## 2020-10-23 ENCOUNTER — Other Ambulatory Visit: Payer: Self-pay | Admitting: Internal Medicine

## 2020-10-23 DIAGNOSIS — B957 Other staphylococcus as the cause of diseases classified elsewhere: Secondary | ICD-10-CM

## 2020-10-23 DIAGNOSIS — Z1621 Resistance to vancomycin: Secondary | ICD-10-CM

## 2020-10-24 ENCOUNTER — Telehealth: Payer: Self-pay | Admitting: *Deleted

## 2020-10-24 NOTE — Chronic Care Management (AMB) (Signed)
  Chronic Care Management   Note  10/24/2020 Name: Megan Good MRN: [id number] DOB: [birth year]  Megan Good is a 70 y.o. year old female who is a primary care patient of Isaac Bliss, Rayford Halsted, MD. I reached out to Kary Kos by phone today in response to a referral sent by Megan Good's PCP, Isaac Bliss, Rayford Halsted, MD.     Megan Good was given information about Chronic Care Management services today including:  1. CCM service includes personalized support from designated clinical staff supervised by her physician, including individualized plan of care and coordination with other care providers 2. 24/7 contact phone numbers for assistance for urgent and routine care needs. 3. Service will only be billed when office clinical staff spend 20 minutes or more in a month to coordinate care. 4. Only one practitioner may furnish and bill the service in a calendar month. 5. The patient may stop CCM services at any time (effective at the end of the month) by phone call to the office staff. 6. The patient will be responsible for cost sharing (co-pay) of up to 20% of the service fee (after annual deductible is met).  Patient agreed to services and verbal consent obtained.   Follow up plan: Telephone appointment with care management team member scheduled for:10/31/2020  [facility]

## 2020-10-30 ENCOUNTER — Other Ambulatory Visit: Payer: Medicare PPO

## 2020-10-31 ENCOUNTER — Ambulatory Visit (INDEPENDENT_AMBULATORY_CARE_PROVIDER_SITE_OTHER): Payer: Medicare PPO

## 2020-10-31 DIAGNOSIS — E0590 Thyrotoxicosis, unspecified without thyrotoxic crisis or storm: Secondary | ICD-10-CM | POA: Diagnosis not present

## 2020-10-31 DIAGNOSIS — M5410 Radiculopathy, site unspecified: Secondary | ICD-10-CM

## 2020-10-31 DIAGNOSIS — E785 Hyperlipidemia, unspecified: Secondary | ICD-10-CM

## 2020-10-31 NOTE — Chronic Care Management (AMB) (Signed)
Chronic Care Management   CCM RN Visit Note  10/31/2020 Name: Megan Good MRN: [id number] DOB: 1950/09/16  Subjective: Megan Good is a 70 y.o. year old female who is a primary care patient of Isaac Bliss, Rayford Halsted, MD. The care management team was consulted for assistance with disease management and care coordination needs.    Engaged with patient by telephone for initial visit in response to provider referral for case management and/or care coordination services.   Consent to Services:  The patient was given the following information about Chronic Care Management services today, agreed to services, and gave verbal consent: 1. CCM service includes personalized support from designated clinical staff supervised by the primary care provider, including individualized plan of care and coordination with other care providers 2. 24/7 contact phone numbers for assistance for urgent and routine care needs. 3. Service will only be billed when office clinical staff spend 20 minutes or more in a month to coordinate care. 4. Only one practitioner may furnish and bill the service in a calendar month. 5.The patient may stop CCM services at any time (effective at the end of the month) by phone call to the office staff. 6. The patient will be responsible for cost sharing (co-pay) of up to 20% of the service fee (after annual deductible is met). Patient agreed to services and consent obtained.  Patient agreed to services and verbal consent obtained.   Assessment: Review of patient past medical history, allergies, medications, health status, including review of consultants reports, laboratory and other test data, was performed as part of comprehensive evaluation and provision of chronic care management services.   SDOH (Social Determinants of Health) assessments and interventions performed:  SDOH Interventions   Flowsheet Row Most Recent Value  SDOH Interventions   Food Insecurity  Interventions Intervention Not Indicated  Financial Strain Interventions Intervention Not Indicated  Housing Interventions Intervention Not Indicated  Physical Activity Interventions Intervention Not Indicated  Stress Interventions Intervention Not Indicated  Social Connections Interventions Intervention Not Indicated  Transportation Interventions Intervention Not Indicated       CCM Care Plan  Allergies  Allergen Reactions  . Penicillins Swelling and Rash    Has patient had a PCN reaction causing immediate rash, facial/tongue/throat swelling, SOB or lightheadedness with hypotension: No Has patient had a PCN reaction causing severe rash involving mucus membranes or skin necrosis: No Has patient had a PCN reaction that required hospitalization: No Has patient had a PCN reaction occurring within the last 10 years: No If all of the above answers are "NO", then may proceed with Cephalosporin use.  Burn-like rash    Outpatient Encounter Medications as of 10/31/2020  Medication Sig  . aspirin EC 81 MG tablet Take 81 mg by mouth daily. Swallow whole.  . gabapentin (NEURONTIN) 100 MG capsule Take 1 capsule (100 mg total) by mouth at bedtime.  . Multiple Vitamins-Minerals (MULTIVITAMIN ADULTS PO) Take 1 tablet by mouth daily.  . rosuvastatin (CRESTOR) 10 MG tablet Take 1 tablet (10 mg total) by mouth daily.  . [DISCONTINUED] pantoprazole (PROTONIX) 20 MG tablet Take 1 tablet (20 mg total) by mouth daily. (Patient not taking: Reported on 03/30/2020)   No facility-administered encounter medications on file as of 10/31/2020.    Patient Active Problem List   Diagnosis Date Noted  . GERD (gastroesophageal reflux disease) 12/07/2019  . Hyperlipidemia 12/07/2019  . Subclinical hyperthyroidism 12/07/2019  . Radicular neuropathy 09/02/2019    Conditions to be addressed/monitored:HLD and neuropathy, hypothyroidism  Care Plan : RNCM:Chronic Pain (Adult)  Updates made by Dimitri Ped, RN  since 10/31/2020 12:00 AM    Problem: Chronic Pain Management (Chronic Pain)   Priority: Medium    Long-Range Goal: Chronic Pain Managed   Start Date: 10/31/2020  Expected End Date: 04/09/2021  This Visit's Progress: On track  Priority: Medium  Note:   Current Barriers:  Marland Kitchen Knowledge Deficits related to self-health management of acute or chronic pain -Radicular neuropathy . Chronic Disease Management support and education needs related to chronic pain-Radicular neuropathy . Knowledge Deficits related to self management of Radicular neuropathy . Chronic Disease Management support and education needs related to Radicular neuropathy . No Advanced Directives in place . Lacks social connections Clinical Goal(s):  . patient will verbalize understanding of plan for pain management. , patient will meet with RN Care Manager to address self management of Radicular neuropathy, patient will attend all scheduled medical appointments: no primary care provider scheudled, patient will demonstrate use of different relaxation  skills and/or diversional activities to assist with pain reduction (distraction, imagery, relaxation, massage, acupressure, TENS, heat, and cold application., patient will report pain at a level less than 3 to 4 on a 10-10 rating scale., patient will use pharmacological and nonpharmacological pain relief strategies as prescribed. , and patient will engage in desired activities without an increase in pain level Interventions:  . Collaboration with Isaac Bliss, Rayford Halsted, MD regarding development and update of comprehensive plan of care as evidenced by provider attestation and co-signature . Pain assessment performed . Medications reviewed . Discussed plans with patient for ongoing care management follow up and provided patient with direct contact information for care management team . Evaluation of current treatment plan related to Radicular neuropathy and patient's adherence to plan as  established by provider. . Advised patient to get regular exercise and to slowly progress to prevent injury . Provided education to patient and/or caregiver about advanced directives . Provided education to patient re: Radicular neuropathy . Reviewed medications with patient and discussed adherence and to get timely refills . Discussed plans with patient for ongoing care management follow up and provided patient with direct contact information for care management team . Instructed to call her insurance to get information on exercise programs  Patient Goals/Self Care Activities:  . Will self-administer medications as prescribed . Will attend all scheduled provider appointments . Will call pharmacy for medication refills 7 days prior to needed refill date . Patient will calls provider office for new concerns or questions Follow Up Plan: Telephone follow up appointment with care management team member scheduled for: 12/18/20 at 2 PM The patient has been provided with contact information for the care management team and has been advised to call with any health related questions or concerns.      Care Plan : MCNO:[id number]  Updates made by Dimitri Ped, RN since 10/31/2020 12:00 AM    Problem: Lack of self management of Hyperlipidemia   Priority: Medium    Long-Range Goal: Effective self management of Hyperlipidemia   Start Date: 10/31/2020  Expected End Date: 04/09/2021  This Visit's Progress: On track  Priority: Medium  Note:   Current Barriers:  . Poorly controlled hyperlipidemia, complicated by hypothyroidism, Radicular neuropathy . Current antihyperlipidemic regimen: Rosuvastatin . Most recent lipid panel:     Component Value Date/Time   CHOL 161 10/10/2020 0754   TRIG 66.0 10/10/2020 0754   HDL 54.90 10/10/2020 0754   CHOLHDL 3 10/10/2020 0754   VLDL  13.2 10/10/2020 0754   LDLCALC 93 10/10/2020 0754   LDLCALC 161 (H) 03/09/2020 0754 .   Marland Kitchen ASCVD risk enhancing  conditions: age >16 . Unable to independently self manage hyperlipidemia . Lacks Microbiologist Clinical Goal(s):  . patient will work with Consulting civil engineer, providers, and care team towards execution of optimized self-health management plan . patient will verbalize understanding of plan for hyperlipidemia . patient will meet with RN Care Manager to address hyperlipidemia . patient will demonstrate improved adherence to prescribed treatment plan for hyperlipidemia as evidenced byimproved lipids . patient will demonstrate understanding of rationale for each prescribed medication as evidenced by adherence . the patient will work with the care management team towards completion of advanced directives Interventions: . Collaboration with Isaac Bliss, Rayford Halsted, MD regarding development and update of comprehensive plan of care as evidenced by provider attestation and co-signature . Inter-disciplinary care team collaboration (see longitudinal plan of care) . Medication review performed; medication list updated in electronic medical record.  Bertram Savin care team collaboration (see longitudinal plan of care) . Evaluation of current treatment plan related to hyperlipidemia and patient's adherence to plan as established by provider. . Advised patient to increase exercise and follow health healthy diet . Provided education to patient and/or caregiver about advanced directives . Provided education to patient re: hyperlipidemia . Provided patient with written educational materials related to hyperlipidemia . Discussed plans with patient for ongoing care management follow up and provided patient with direct contact information for care management team Patient Goals/Self-Care Activities: - keep a list of all the medicines I take; vitamins and herbals too - change to whole grain breads, cereal, pasta - drink 6 to 8 glasses of water each day - eat 3 to 5 servings of fruits and  vegetables each day - manage portion size - learn my personal risk factors - change to whole grain breads, cereal, pasta - eat smaller or less servings of red meat - fill half the plate with nonstarchy vegetables - get blood test (fasting) done 1 week before next visit - increase the amount of fiber in food - read food labels for fat and fiber  Follow Up Plan: Telephone follow up appointment with care management team member scheduled for: 12/18/20 at 2 PM The patient has been provided with contact information for the care management team and has been advised to call with any health related questions or concerns.        Plan:Telephone follow up appointment with care management team member scheduled for:  12/18/20 and The patient has been provided with contact information for the care management team and has been advised to call with any health related questions or concerns.  Peter Garter RN, Jackquline Denmark, CDE Care Management Coordinator [facility] [phone], Mobile [phone]

## 2020-10-31 NOTE — Patient Instructions (Signed)
Visit Information   PATIENT GOALS:  Goals Addressed            This Visit's Progress   . Cope with Chronic Pain       Timeframe:  Long-Range Goal Priority:  Medium Start Date:        10/31/20                     Expected End Date:    04/09/21                   Follow Up Date 12/18/20    - learn how to meditate - learn relaxation techniques - practice relaxation or meditation daily - think of new ways to do favorite things - use distraction techniques    Why is this important?    Stress makes chronic pain feel worse.   Feelings like depression, anxiety, stress and anger can make your body more sensitive to pain.   Learning ways to cope with stress or depression may help you find some relief from the pain.     Notes:     Marland Kitchen Manage My Cholesterol       Timeframe:  Long-Range Goal Priority:  Medium Start Date:     10/31/20                        Expected End Date:     04/09/21                  Follow Up Date 12/18/20    - change to whole grain breads, cereal, pasta - eat smaller or less servings of red meat - fill half the plate with nonstarchy vegetables - get blood test (fasting) done 1 week before next visit - increase the amount of fiber in food - read food labels for fat and fiber    Why is this important?   Changing cholesterol starts with eating heart-healthy foods.  Other steps may be to increase your activity and to quit if you smoke.    Notes:        High Cholesterol  High cholesterol is a condition in which the blood has high levels of a white, waxy substance similar to fat (cholesterol). The liver makes all the cholesterol that the body needs. The human body needs small amounts of cholesterol to help build cells. A person gets extra or excess cholesterol from the food that he or she eats. The blood carries cholesterol from the liver to the rest of the body. If you have high cholesterol, deposits (plaques) may build up on the walls of your arteries.  Arteries are the blood vessels that carry blood away from your heart. These plaques make the arteries narrow and stiff. Cholesterol plaques increase your risk for heart attack and stroke. Work with your health care provider to keep your cholesterol levels in a healthy range. What increases the risk? The following factors may make you more likely to develop this condition:  Eating foods that are high in animal fat (saturated fat) or cholesterol.  Being overweight.  Not getting enough exercise.  A family history of high cholesterol (familial hypercholesterolemia).  Use of tobacco products.  Having diabetes. What are the signs or symptoms? There are no symptoms of this condition. How is this diagnosed? This condition may be diagnosed based on the results of a blood test.  If you are older than 71 years of age, your health care  provider may check your cholesterol levels every 4-6 years.  You may be checked more often if you have high cholesterol or other risk factors for heart disease. The blood test for cholesterol measures:  "Bad" cholesterol, or LDL cholesterol. This is the main type of cholesterol that causes heart disease. The desired level is less than 100 mg/dL.  "Good" cholesterol, or HDL cholesterol. HDL helps protect against heart disease by cleaning the arteries and carrying the LDL to the liver for processing. The desired level for HDL is 60 mg/dL or higher.  Triglycerides. These are fats that your body can store or burn for energy. The desired level is less than 150 mg/dL.  Total cholesterol. This measures the total amount of cholesterol in your blood and includes LDL, HDL, and triglycerides. The desired level is less than 200 mg/dL. How is this treated? This condition may be treated with:  Diet changes. You may be asked to eat foods that have more fiber and less saturated fats or added sugar.  Lifestyle changes. These may include regular exercise, maintaining a healthy  weight, and quitting use of tobacco products.  Medicines. These are given when diet and lifestyle changes have not worked. You may be prescribed a statin medicine to help lower your cholesterol levels. Follow these instructions at home: Eating and drinking  Eat a healthy, balanced diet. This diet includes: ? Daily servings of a variety of fresh, frozen, or canned fruits and vegetables. ? Daily servings of whole grain foods that are rich in fiber. ? Foods that are low in saturated fats and trans fats. These include poultry and fish without skin, lean cuts of meat, and low-fat dairy products. ? A variety of fish, especially oily fish that contain omega-3 fatty acids. Aim to eat fish at least 2 times a week.  Avoid foods and drinks that have added sugar.  Use healthy cooking methods, such as roasting, grilling, broiling, baking, poaching, steaming, and stir-frying. Do not fry your food except for stir-frying.   Lifestyle  Get regular exercise. Aim to exercise for a total of 150 minutes a week. Increase your activity level by doing activities such as gardening, walking, and taking the stairs.  Do not use any products that contain nicotine or tobacco, such as cigarettes, e-cigarettes, and chewing tobacco. If you need help quitting, ask your health care provider.   General instructions  Take over-the-counter and prescription medicines only as told by your health care provider.  Keep all follow-up visits as told by your health care provider. This is important. Where to find more information  American Heart Association: www.heart.org  National Heart, Lung, and Blood Institute: https://wilson-eaton.com/ Contact a health care provider if:  You have trouble achieving or maintaining a healthy diet or weight.  You are starting an exercise program.  You are unable to stop smoking. Get help right away if:  You have chest pain.  You have trouble breathing.  You have any symptoms of a stroke. "BE  FAST" is an easy way to remember the main warning signs of a stroke: ? B - Balance. Signs are dizziness, sudden trouble walking, or loss of balance. ? E - Eyes. Signs are trouble seeing or a sudden change in vision. ? F - Face. Signs are sudden weakness or numbness of the face, or the face or eyelid drooping on one side. ? A - Arms. Signs are weakness or numbness in an arm. This happens suddenly and usually on one side of the body. ?  S - Speech. Signs are sudden trouble speaking, slurred speech, or trouble understanding what people say. ? T - Time. Time to call emergency services. Write down what time symptoms started.  You have other signs of a stroke, such as: ? A sudden, severe headache with no known cause. ? Nausea or vomiting. ? Seizure. These symptoms may represent a serious problem that is an emergency. Do not wait to see if the symptoms will go away. Get medical help right away. Call your local emergency services (911 in the [place] Do not drive yourself to the hospital. Summary  Cholesterol plaques increase your risk for heart attack and stroke. Work with your health care provider to keep your cholesterol levels in a healthy range.  Eat a healthy, balanced diet, get regular exercise, and maintain a healthy weight.  Do not use any products that contain nicotine or tobacco, such as cigarettes, e-cigarettes, and chewing tobacco.  Get help right away if you have any symptoms of a stroke. This information is not intended to replace advice given to you by your health care provider. Make sure you discuss any questions you have with your health care provider. Document Revised: 04/26/2019 Document Reviewed: 04/26/2019 Elsevier Patient Education  2021 [place].  Chronic Pain, Adult Chronic pain is a type of pain that lasts or keeps coming back for at least 3-6 months. You may have headaches, pain in the abdomen, or pain in other areas of the body. Chronic pain may be related to an  illness, such as fibromyalgia or complex regional pain syndrome. Chronic pain may also be related to an injury or a health condition. Sometimes, the cause of chronic pain is not known. Chronic pain can make it hard for you to do daily activities. If not treated, chronic pain can lead to anxiety and depression. Treatment depends on the cause and severity of your pain. You may need to work with a pain specialist to come up with a treatment plan. The plan may include medicine, counseling, and physical therapy. Many people benefit from a combination of two or more types of treatment to control their pain. Follow these instructions at home: Medicines  Take over-the-counter and prescription medicines only as told by your health care provider.  Ask your health care provider if the medicine prescribed to you: ? Requires you to avoid driving or using machinery. ? Can cause constipation. You may need to take these actions to prevent or treat constipation:  Drink enough fluid to keep your urine pale yellow.  Take over-the-counter or prescription medicines.  Eat foods that are high in fiber, such as beans, whole grains, and fresh fruits and vegetables.  Limit foods that are high in fat and processed sugars, such as fried or sweet foods. Treatment plan Follow your treatment plan as told by your health care provider. This may include:  Gentle, regular exercise.  Eating a healthy diet that includes foods such as vegetables, fruits, fish, and lean meats.  Cognitive or behavioral therapy that changes the way you think or act in response to the pain. This may help improve how you feel.  Working with a physical therapist.  Meditation, yoga, acupuncture, or massage therapy.  Aroma, color, light, or sound therapy.  Local electrical stimulation. The electrical pulses help to relieve pain by temporarily stopping the nerve impulses that cause you to feel pain.  Injections. These deliver numbing or  pain-relieving medicines into the spine or the area of pain.   Lifestyle  Ask your  health care provider whether you should keep a pain diary. Your health care provider will tell you what information to write in the diary. This may include when you have pain, what the pain feels like, and how medicines and other behaviors or treatments help to reduce the pain.  Consider talking with a mental health care provider about how to manage chronic pain.  Consider joining a chronic pain support group.  Try to control or lower your stress levels. Talk with your health care provider about ways to do this.   General instructions  Learn as much as you can about how to manage your chronic pain. Ask your health care provider if an intensive pain rehabilitation program or a chronic pain specialist would be helpful.  Check your pain level as told by your health care provider. Ask your health care provider if you should use a pain scale.  It is up to you to get the results of any tests that were done. Ask your health care provider, or the department that is doing the tests, when your results will be ready.  Keep all follow-up visits as told by your health care provider. This is important. Contact a health care provider if:  Your pain gets worse, or you have new pain.  You have trouble sleeping.  You have trouble doing your normal activities.  Your pain is not controlled with treatment.  You have side effects from pain medicine.  You feel weak.  You notice any other changes that show that your condition is getting worse. Get help right away if:  You lose feeling or have numbness in your body.  You lose control of bowel or bladder function.  Your pain suddenly gets much worse.  You develop shaking or chills.  You develop confusion.  You develop chest pain.  You have trouble breathing or shortness of breath.  You pass out.  You have thoughts about hurting yourself or others. If you  ever feel like you may hurt yourself or others, or have thoughts about taking your own life, get help right away. Go to your nearest emergency department or:  Call your local emergency services (911 in the [place]  Call a suicide crisis helpline, such as the [place] at 503-640-9417. This is open 24 hours a day in the [place]  Text the Crisis Text Line at 870-603-5335 (in the [place].). Summary  Chronic pain is a type of pain that lasts or keeps coming back for at least 3-6 months.  Chronic pain may be related to an illness, injury, or other health condition. Sometimes, the cause of chronic pain is not known.  Treatment depends on the cause and severity of your pain.  Many people benefit from a combination of two or more types of treatment to control their pain.  Follow your treatment plan as told by your health care provider. This information is not intended to replace advice given to you by your health care provider. Make sure you discuss any questions you have with your health care provider. Document Revised: 02/11/2019 Document Reviewed: 02/11/2019 Elsevier Patient Education  2021 [place].  Consent to CCM Services: Ms. Savino was given information about Chronic Care Management services today including:  1. CCM service includes personalized support from designated clinical staff supervised by her physician, including individualized plan of care and coordination with other care providers 2. 24/7 contact phone numbers for assistance for urgent and routine care needs. 3. Service will only be billed when  office clinical staff spend 20 minutes or more in a month to coordinate care. 4. Only one practitioner may furnish and bill the service in a calendar month. 5. The patient may stop CCM services at any time (effective at the end of the month) by phone call to the office staff. 6. The patient will be responsible for cost sharing (co-pay) of up to 20% of the service  fee (after annual deductible is met).  Patient agreed to services and verbal consent obtained.   The patient verbalized understanding of instructions, educational materials, and care plan provided today and agreed to receive a mailed copy of patient instructions, educational materials, and care plan.   Telephone follow up appointment with care management team member scheduled for: 12/18/20 at 2 PM [place], [facility], CDE Care Management Coordinator [facility] [phone], Mobile [phone]  CLINICAL CARE PLAN: Patient Care Plan: RNCM:Chronic Pain (Adult)    Problem Identified: Chronic Pain Management (Chronic Pain)   Priority: Medium    Long-Range Goal: Chronic Pain Managed   Start Date: 10/31/2020  Expected End Date: 04/09/2021  This Visit's Progress: On track  Priority: Medium  Note:   Current Barriers:  Marland Kitchen Knowledge Deficits related to self-health management of acute or chronic pain -Radicular neuropathy . Chronic Disease Management support and education needs related to chronic pain-Radicular neuropathy . Knowledge Deficits related to self management of Radicular neuropathy . Chronic Disease Management support and education needs related to Radicular neuropathy . No Advanced Directives in place . Lacks social connections Clinical Goal(s):  . patient will verbalize understanding of plan for pain management. , patient will meet with RN Care Manager to address self management of Radicular neuropathy, patient will attend all scheduled medical appointments: no primary care provider scheudled, patient will demonstrate use of different relaxation  skills and/or diversional activities to assist with pain reduction (distraction, imagery, relaxation, massage, acupressure, TENS, heat, and cold application., patient will report pain at a level less than 3 to 4 on a 10-10 rating scale., patient will use pharmacological and nonpharmacological pain relief  strategies as prescribed. , and patient will engage in desired activities without an increase in pain level Interventions:  . Collaboration with Isaac Bliss, Rayford Halsted, MD regarding development and update of comprehensive plan of care as evidenced by provider attestation and co-signature . Pain assessment performed . Medications reviewed . Discussed plans with patient for ongoing care management follow up and provided patient with direct contact information for care management team . Evaluation of current treatment plan related to Radicular neuropathy and patient's adherence to plan as established by provider. . Advised patient to get regular exercise and to slowly progress to prevent injury . Provided education to patient and/or caregiver about advanced directives . Provided education to patient re: Radicular neuropathy . Reviewed medications with patient and discussed adherence and to get timely refills . Discussed plans with patient for ongoing care management follow up and provided patient with direct contact information for care management team . Instructed to call her insurance to get information on exercise programs  Patient Goals/Self Care Activities:  . Will self-administer medications as prescribed . Will attend all scheduled provider appointments . Will call pharmacy for medication refills 7 days prior to needed refill date . Patient will calls provider office for new concerns or questions Follow Up Plan: Telephone follow up appointment with care management team member scheduled for: 12/18/20 at 2 PM The patient has been provided with contact information for the care  management team and has been advised to call with any health related questions or concerns.      Patient Care Plan: [id number]    Problem Identified: Lack of self management of Hyperlipidemia   Priority: Medium    Long-Range Goal: Effective self management of Hyperlipidemia   Start Date: 10/31/2020   Expected End Date: 04/09/2021  This Visit's Progress: On track  Priority: Medium  Note:   Current Barriers:  . Poorly controlled hyperlipidemia, complicated by hypothyroidism, Radicular neuropathy . Current antihyperlipidemic regimen: Rosuvastatin . Most recent lipid panel:     Component Value Date/Time   CHOL 161 10/10/2020 0754   TRIG 66.0 10/10/2020 0754   HDL 54.90 10/10/2020 0754   CHOLHDL 3 10/10/2020 0754   VLDL 13.2 10/10/2020 0754   LDLCALC 93 10/10/2020 0754   LDLCALC 161 (H) 03/09/2020 0754 .   Marland Kitchen ASCVD risk enhancing conditions: age >44 . Unable to independently self manage hyperlipidemia . Lacks Microbiologist Clinical Goal(s):  . patient will work with Consulting civil engineer, providers, and care team towards execution of optimized self-health management plan . patient will verbalize understanding of plan for hyperlipidemia . patient will meet with RN Care Manager to address hyperlipidemia . patient will demonstrate improved adherence to prescribed treatment plan for hyperlipidemia as evidenced byimproved lipids . patient will demonstrate understanding of rationale for each prescribed medication as evidenced by adherence . the patient will work with the care management team towards completion of advanced directives Interventions: . Collaboration with Isaac Bliss, Rayford Halsted, MD regarding development and update of comprehensive plan of care as evidenced by provider attestation and co-signature . Inter-disciplinary care team collaboration (see longitudinal plan of care) . Medication review performed; medication list updated in electronic medical record.  Bertram Savin care team collaboration (see longitudinal plan of care) . Evaluation of current treatment plan related to hyperlipidemia and patient's adherence to plan as established by provider. . Advised patient to increase exercise and follow health healthy diet . Provided education to patient  and/or caregiver about advanced directives . Provided education to patient re: hyperlipidemia . Provided patient with written educational materials related to hyperlipidemia . Discussed plans with patient for ongoing care management follow up and provided patient with direct contact information for care management team Patient Goals/Self-Care Activities: - keep a list of all the medicines I take; vitamins and herbals too - change to whole grain breads, cereal, pasta - drink 6 to 8 glasses of water each day - eat 3 to 5 servings of fruits and vegetables each day - manage portion size - learn my personal risk factors - change to whole grain breads, cereal, pasta - eat smaller or less servings of red meat - fill half the plate with nonstarchy vegetables - get blood test (fasting) done 1 week before next visit - increase the amount of fiber in food - read food labels for fat and fiber  Follow Up Plan: Telephone follow up appointment with care management team member scheduled for: 12/18/20 at 2 PM The patient has been provided with contact information for the care management team and has been advised to call with any health related questions or concerns.

## 2020-11-14 ENCOUNTER — Other Ambulatory Visit: Payer: Self-pay

## 2020-11-14 ENCOUNTER — Encounter: Payer: Self-pay | Admitting: Internal Medicine

## 2020-11-14 ENCOUNTER — Ambulatory Visit (INDEPENDENT_AMBULATORY_CARE_PROVIDER_SITE_OTHER): Payer: Medicare PPO | Admitting: Internal Medicine

## 2020-11-14 VITALS — BP 110/66 | HR 72 | Temp 98.1°F | Ht 62.5 in | Wt 129.4 lb

## 2020-11-14 DIAGNOSIS — R350 Frequency of micturition: Secondary | ICD-10-CM | POA: Diagnosis not present

## 2020-11-14 DIAGNOSIS — E785 Hyperlipidemia, unspecified: Secondary | ICD-10-CM | POA: Diagnosis not present

## 2020-11-14 DIAGNOSIS — R300 Dysuria: Secondary | ICD-10-CM | POA: Diagnosis not present

## 2020-11-14 LAB — POC URINALSYSI DIPSTICK (AUTOMATED)
Bilirubin, UA: NEGATIVE
Blood, UA: NEGATIVE
Glucose, UA: NEGATIVE
Ketones, UA: NEGATIVE
Leukocytes, UA: NEGATIVE
Nitrite, UA: NEGATIVE
Protein, UA: NEGATIVE
Spec Grav, UA: 1.015 (ref 1.010–1.025)
Urobilinogen, UA: 0.2 E.U./dL
pH, UA: 6 (ref 5.0–8.0)

## 2020-11-14 MED ORDER — ROSUVASTATIN CALCIUM 10 MG PO TABS: 10.0000 mg | ORAL_TABLET | Freq: Every day | ORAL | 1 refills | Status: DC

## 2020-11-14 NOTE — Progress Notes (Signed)
Established Patient Office Visit     This visit occurred during the SARS-CoV-2 public health emergency.  Safety protocols were in place, including screening questions prior to the visit, additional usage of staff PPE, and extensive cleaning of exam room while observing appropriate contact time as indicated for disinfecting solutions.    CC/Reason for Visit: Discuss acute concern, medication refill  HPI: Megan Good is a 70 y.o. female who is coming in today for the above mentioned reasons.  She has a history of hyperlipidemia and is requesting refill of her rosuvastatin today.  She has also been having increased urinary frequency the past 24 hours, no fever, no suprapubic or flank pain, no dysuria.  Past Medical/Surgical History: Past Medical History:  Diagnosis Date  . Headache     Past Surgical History:  Procedure Laterality Date  . BREAST BIOPSY Left 2003  . TONSILLECTOMY      Social History:  reports that she has never smoked. She has never used smokeless tobacco. She reports that she does not drink alcohol and does not use drugs.  Allergies: Allergies  Allergen Reactions  . Penicillins Swelling and Rash    Has patient had a PCN reaction causing immediate rash, facial/tongue/throat swelling, SOB or lightheadedness with hypotension: No Has patient had a PCN reaction causing severe rash involving mucus membranes or skin necrosis: No Has patient had a PCN reaction that required hospitalization: No Has patient had a PCN reaction occurring within the last 10 years: No If all of the above answers are "NO", then may proceed with Cephalosporin use.  Burn-like rash    Family History:  Family History  Problem Relation Age of Onset  . Healthy Mother 28  . Other Father        MVA  . Stroke Maternal Grandmother      Current Outpatient Medications:  .  gabapentin (NEURONTIN) 100 MG capsule, Take 1 capsule (100 mg total) by mouth at bedtime., Disp: 90 capsule,  Rfl: 1 .  Multiple Vitamins-Minerals (MULTIVITAMIN ADULTS PO), Take 1 tablet by mouth daily., Disp: , Rfl:  .  rosuvastatin (CRESTOR) 10 MG tablet, Take 1 tablet (10 mg total) by mouth daily., Disp: 90 tablet, Rfl: 1  Review of Systems:  Constitutional: Denies fever, chills, diaphoresis, appetite change and fatigue.  HEENT: Denies photophobia, eye pain, redness, hearing loss, ear pain, congestion, sore throat, rhinorrhea, sneezing, mouth sores, trouble swallowing, neck pain, neck stiffness and tinnitus.   Respiratory: Denies SOB, DOE, cough, chest tightness,  and wheezing.   Cardiovascular: Denies chest pain, palpitations and leg swelling.  Gastrointestinal: Denies nausea, vomiting, abdominal pain, diarrhea, constipation, blood in stool and abdominal distention.  Genitourinary: Denies dysuria, urgency,  hematuria, flank pain and difficulty urinating.  Endocrine: Denies: hot or cold intolerance, sweats, changes in hair or nails, polyuria, polydipsia. Musculoskeletal: Denies myalgias, back pain, joint swelling, arthralgias and gait problem.  Skin: Denies pallor, rash and wound.  Neurological: Denies dizziness, seizures, syncope, weakness, light-headedness, numbness and headaches.  Hematological: Denies adenopathy. Easy bruising, personal or family bleeding history  Psychiatric/Behavioral: Denies suicidal ideation, mood changes, confusion, nervousness, sleep disturbance and agitation    Physical Exam: Vitals:   11/14/20 0822  BP: 110/66  Pulse: 72  Temp: 98.1 F (36.7 C)  TempSrc: Oral  SpO2: 97%  Weight: 129 lb 6.4 oz (58.7 kg)  Height: 5' 2.5" (1.588 m)    Body mass index is 23.29 kg/m.   Constitutional: NAD, calm, comfortable Eyes: PERRL, lids and  conjunctivae normal ENMT: Mucous membranes are moist.  Neurologic: Grossly intact and nonfocal Psychiatric: Normal judgment and insight. Alert and oriented x 3. Normal mood.    Impression and Plan:  Urine frequency -In office  urine dipstick is negative. -She will monitor her symptoms and notify me if progression.  Hyperlipidemia, unspecified hyperlipidemia type  - Plan: rosuvastatin (CRESTOR) 10 MG tablet    Megan Good Philip Aspen, MD [facility]

## 2020-11-16 ENCOUNTER — Ambulatory Visit: Payer: Medicare PPO | Admitting: Internal Medicine

## 2020-12-05 ENCOUNTER — Other Ambulatory Visit: Payer: Medicare PPO

## 2020-12-18 ENCOUNTER — Ambulatory Visit (INDEPENDENT_AMBULATORY_CARE_PROVIDER_SITE_OTHER): Payer: Medicare PPO

## 2020-12-18 DIAGNOSIS — E785 Hyperlipidemia, unspecified: Secondary | ICD-10-CM

## 2020-12-18 DIAGNOSIS — M5410 Radiculopathy, site unspecified: Secondary | ICD-10-CM

## 2020-12-18 NOTE — Patient Instructions (Addendum)
Visit Information  PATIENT GOALS:  Goals Addressed             This Visit's Progress    Cope with Chronic Pain   On track    Timeframe:  Long-Range Goal Priority:  Medium Start Date:        10/31/20                     Expected End Date:    04/09/21                   Follow Up Date 02/19/21    - learn how to meditate - learn relaxation techniques - practice relaxation or meditation daily - think of new ways to do favorite things - use distraction techniques    Why is this important?   Stress makes chronic pain feel worse.  Feelings like depression, anxiety, stress and anger can make your body more sensitive to pain.  Learning ways to cope with stress or depression may help you find some relief from the pain.     Notes:       Manage My Cholesterol   On track    Timeframe:  Long-Range Goal Priority:  Medium Start Date:     10/31/20                        Expected End Date:     04/09/21                  Follow Up Date 02/19/21    - change to whole grain breads, cereal, pasta - eat smaller or less servings of red meat - fill half the plate with nonstarchy vegetables - get blood test (fasting) done 1 week before next visit - increase the amount of fiber in food - read food labels for fat and fiber    Why is this important?   Changing cholesterol starts with eating heart-healthy foods.  Other steps may be to increase your activity and to quit if you smoke.    Notes:        Dyslipidemia Dyslipidemia is an imbalance of waxy, fat-like substances (lipids) in the blood. The body needs lipids in small amounts. Dyslipidemia often involves a high level of cholesterol or triglycerides, which are types oflipids. Common forms of dyslipidemia include: High levels of LDL cholesterol. LDL is the type of cholesterol that causes fatty deposits (plaques) to build up in the blood vessels that carry blood away from your heart (arteries). Low levels of HDL cholesterol. HDL cholesterol  is the type of cholesterol that protects against heart disease. High levels of HDL remove the LDL buildup from arteries. High levels of triglycerides. Triglycerides are a fatty substance in the blood that is linked to a buildup of plaques in the arteries. What are the causes? Primary dyslipidemia is caused by changes (mutations) in genes that are passed down through families (inherited). These mutations cause several types of dyslipidemia. Secondary dyslipidemia is caused by lifestyle choices and diseases that lead to dyslipidemia, such as: Eating a diet that is high in animal fat. Not getting enough exercise. Having diabetes, kidney disease, liver disease, or thyroid disease. Drinking large amounts of alcohol. Using certain medicines. What increases the risk? You are more likely to develop this condition if you are an older man or if you are a woman who has gone through menopause. Other risk factors include: Having a family history of dyslipidemia. Taking  certain medicines, including birth control pills, steroids, some diuretics, and beta-blockers. Smoking cigarettes. Eating a high-fat diet. Having certain medical conditions such as diabetes, polycystic ovary syndrome (PCOS), kidney disease, liver disease, or hypothyroidism. Not exercising regularly. Being overweight or obese with too much belly fat. What are the signs or symptoms? In most cases, dyslipidemia does not usually cause any symptoms. In severe cases, very high lipid levels can cause: Fatty bumps under the skin (xanthomas). White or gray ring around the black center (pupil) of the eye. Very high triglyceride levels can cause inflammation of the pancreas (pancreatitis). How is this diagnosed? Your health care provider may diagnose dyslipidemia based on a routine blood test (fasting blood test). Because most people do not have symptoms of the condition, this blood testing (lipid profile) is done on adults age 72 and older and is  repeated every 5 years. This test checks: Total cholesterol. This measures the total amount of cholesterol in your blood, including LDL cholesterol, HDL cholesterol, and triglycerides. A healthy number is below 200. LDL cholesterol. The target number for LDL cholesterol is different for each person, depending on individual risk factors. Ask your health care provider what your LDL cholesterol should be. HDL cholesterol. An HDL level of 60 or higher is best because it helps to protect against heart disease. A number below 40 for men or below 50 for women increases the risk for heart disease. Triglycerides. A healthy triglyceride number is below 150. If your lipid profile is abnormal, your health care provider may do other bloodtests. How is this treated? Treatment depends on the type of dyslipidemia that you have and your other risk factors for heart disease and stroke. Your health care provider will have atarget range for your lipid levels based on this information. For many people, this condition may be treated by lifestyle changes, such as diet and exercise. Your health care provider may recommend that you: Get regular exercise. Make changes to your diet. Quit smoking if you smoke. If diet changes and exercise do not help you reach your goals, your health care provider may also prescribe medicine to lower lipids. The most commonly prescribed type of medicine lowers your LDL cholesterol (statin drug). If you have a high triglyceride level, your provider may prescribe another type of drug (fibrate) or an omega-3 fish oil supplement, or both. Follow these instructions at home:  Eating and drinking Follow instructions from your health care provider or dietitian about eating or drinking restrictions. Eat a healthy diet as told by your health care provider. This can help you reach and maintain a healthy weight, lower your LDL cholesterol, and raise your HDL cholesterol. This may include: Limiting your  calories, if you are overweight. Eating more fruits, vegetables, whole grains, fish, and lean meats. Limiting saturated fat, trans fat, and cholesterol. If you drink alcohol: Limit how much you use. Be aware of how much alcohol is in your drink. In the [place] one drink equals one 12 oz bottle of beer (355 mL), one 5 oz glass of wine (148 mL), or one 1 oz glass of hard liquor (44 mL). Do not drink alcohol if: Your health care provider tells you not to drink. You are pregnant, may be pregnant, or are planning to become pregnant. Activity Get regular exercise. Start an exercise and strength training program as told by your health care provider. Ask your health care provider what activities are safe for you. Your health care provider may recommend: 30 minutes of aerobic activity  4-6 days a week. Brisk walking is an example of aerobic activity. Strength training 2 days a week. General instructions Do not use any products that contain nicotine or tobacco, such as cigarettes, e-cigarettes, and chewing tobacco. If you need help quitting, ask your health care provider. Take over-the-counter and prescription medicines only as told by your health care provider. This includes supplements. Keep all follow-up visits as told by your health care provider. Contact a health care provider if: You are: Having trouble sticking to your exercise or diet plan. Struggling to quit smoking or control your use of alcohol. Summary Dyslipidemia often involves a high level of cholesterol or triglycerides, which are types of lipids. Treatment depends on the type of dyslipidemia that you have and your other risk factors for heart disease and stroke. For many people, treatment starts with lifestyle changes, such as diet and exercise. Your health care provider may prescribe medicine to lower lipids. This information is not intended to replace advice given to you by your health care provider. Make sure you discuss any  questions you have with your healthcare provider. Document Revised: 01/19/2018 Document Reviewed: 12/26/2017 Elsevier Patient Education  2022 [employer].   The patient verbalized understanding of instructions, educational materials, and care plan provided today and agreed to receive a mailed copy of patient instructions, educational materials, and care plan.   Telephone follow up appointment with care management team member scheduled for: 02/19/21  Dudley Major RN, [facility], CDE Care Management Coordinator [facility] [phone], Mobile [phone]

## 2020-12-18 NOTE — Chronic Care Management (AMB) (Signed)
Chronic Care Management   CCM RN Visit Note  12/18/2020 Name: Megan Good MRN: [id number] DOB: 1951-02-24  Subjective: Megan Good is a 70 y.o. year old female who is a primary care patient of Megan Good, Megan Patricia, MD. The care management team was consulted for assistance with disease management and care coordination needs.    Engaged with patient by telephone for follow up visit in response to provider referral for case management and/or care coordination services.   Consent to Services:  The patient was given information about Chronic Care Management services, agreed to services, and gave verbal consent prior to initiation of services.  Please see initial visit note for detailed documentation.   Patient agreed to services and verbal consent obtained.   Assessment: Review of patient past medical history, allergies, medications, health status, including review of consultants reports, laboratory and other test data, was performed as part of comprehensive evaluation and provision of chronic care management services.   SDOH (Social Determinants of Health) assessments and interventions performed:    CCM Care Plan  Allergies  Allergen Reactions   Penicillins Swelling and Rash    Has patient had a PCN reaction causing immediate rash, facial/tongue/throat swelling, SOB or lightheadedness with hypotension: No Has patient had a PCN reaction causing severe rash involving mucus membranes or skin necrosis: No Has patient had a PCN reaction that required hospitalization: No Has patient had a PCN reaction occurring within the last 10 years: No If all of the above answers are "NO", then may proceed with Cephalosporin use.  Burn-like rash    Outpatient Encounter Medications as of 12/18/2020  Medication Sig   gabapentin (NEURONTIN) 100 MG capsule Take 1 capsule (100 mg total) by mouth at bedtime.   Multiple Vitamins-Minerals (MULTIVITAMIN ADULTS PO) Take 1 tablet by mouth  daily.   rosuvastatin (CRESTOR) 10 MG tablet Take 1 tablet (10 mg total) by mouth daily.   [DISCONTINUED] pantoprazole (PROTONIX) 20 MG tablet Take 1 tablet (20 mg total) by mouth daily. (Patient not taking: Reported on 03/30/2020)   No facility-administered encounter medications on file as of 12/18/2020.    Patient Active Problem List   Diagnosis Date Noted   GERD (gastroesophageal reflux disease) 12/07/2019   Hyperlipidemia 12/07/2019   Subclinical hyperthyroidism 12/07/2019   Radicular neuropathy 09/02/2019    Conditions to be addressed/monitored:HLD and chronic pain/radicular neuropathy  Care Plan : RNCM:Chronic Pain (Adult)  Updates made by Yetta Glassman, RN since 12/18/2020 12:00 AM     Problem: Chronic Pain Management (Chronic Pain)   Priority: Medium     Long-Range Goal: Chronic Pain Managed   Start Date: 10/31/2020  Expected End Date: 04/09/2021  This Visit's Progress: On track  Recent Progress: On track  Priority: Medium  Note:   Current Barriers:  Knowledge Deficits related to self-health management of acute or chronic pain -Radicular neuropathy Chronic Disease Management support and education needs related to chronic pain-Radicular neuropathy Knowledge Deficits related to self management of Radicular neuropathy Chronic Disease Management support and education needs related to Radicular neuropathy No Advanced Directives in place Lacks social connections States that her pain level varies depending if she has over done her activity on her good days.  States rest does help with her pain if she has over done it.  States she will take Tylenol PM at bedtime if she has more discomfort. Clinical Goal(s):  patient will verbalize understanding of plan for pain management. , patient will meet with RN Care Manager to  address self management of Radicular neuropathy, patient will attend all scheduled medical appointments: no primary care provider scheudled, patient will  demonstrate use of different relaxation  skills and/or diversional activities to assist with pain reduction (distraction, imagery, relaxation, massage, acupressure, TENS, heat, and cold application., patient will report pain at a level less than 3 to 4 on a 10-10 rating scale., patient will use pharmacological and nonpharmacological pain relief strategies as prescribed. , and patient will engage in desired activities without an increase in pain level Interventions:  Collaboration with Megan Good, Megan Patricia, MD regarding development and update of comprehensive plan of care as evidenced by provider attestation and co-signature Pain assessment performed Medications reviewed Discussed plans with patient for ongoing care management follow up and provided patient with direct contact information for care management team Evaluation of current treatment plan related to Radicular neuropathy and patient's adherence to plan as established by provider. Encouraged patient to get regular exercise and to slowly progress to prevent injury Reinforced education to patient and/or caregiver about advanced directives-resent Advanced Directives  packet Reinforced education to patient re: Radicular neuropathy Reviewed medications with patient and discussed adherence and to get timely refills Reviewed scheduled/upcoming provider appointments including: mammogram 12/20/20, no primary care provider scheduled Discussed plans with patient for ongoing care management follow up and provided patient with direct contact information for care management team Reviewed to pace her activity and to not over do it in the heat Discussed use of Tylenol PM and encouraged to limit use of night time pills due to risk of oversedation and falls Patient Goals/Self Care Activities:  Will self-administer medications as prescribed Will attend all scheduled provider appointments Will call pharmacy for medication refills 7 days prior to needed  refill date Patient will calls provider office for new concerns or questions Follow Up Plan: Telephone follow up appointment with care management team member scheduled for: 02/19/21 at 2 PM The patient has been provided with contact information for the care management team and has been advised to call with any health related questions or concerns.       Care Plan : RNCM:Hyperlipidemia  Updates made by Yetta Glassman, RN since 12/18/2020 12:00 AM     Problem: Lack of self management of Hyperlipidemia   Priority: Medium     Long-Range Goal: Effective self management of Hyperlipidemia   Start Date: 10/31/2020  Expected End Date: 04/09/2021  This Visit's Progress: On track  Recent Progress: On track  Priority: Medium  Note:   Current Barriers:  Poorly controlled hyperlipidemia, complicated by hypothyroidism, Radicular neuropathy Current antihyperlipidemic regimen: Rosuvastatin Most recent lipid panel:     Component Value Date/Time   CHOL 161 10/10/2020 0754   TRIG 66.0 10/10/2020 0754   HDL 54.90 10/10/2020 0754   CHOLHDL 3 10/10/2020 0754   VLDL 13.2 10/10/2020 0754   LDLCALC 93 10/10/2020 0754   LDLCALC 161 (H) 03/09/2020 0754   ASCVD risk enhancing conditions: age >68 Unable to independently self manage hyperlipidemia Lacks social connections States she saw her doctor in June to have her cholesterol  medication renewed and because she had been having urinary frequency.  States she did not have a UTI but she thinks she had been drinking too much fluids.  States she is working hard to follow a plant based diet especially for breakfast.  States she limits the meat she eats and is eating more fruits and vegetables.  States she is walking her dog for apx 1 mile a day as  her pain allows.  States she did not receive the Environmental education officer Clinical Goal(s):  patient will work with Medical illustrator, providers, and care team towards execution of optimized  self-health management plan patient will verbalize understanding of plan for hyperlipidemia patient will meet with RN Care Manager to address hyperlipidemia patient will demonstrate improved adherence to prescribed treatment plan for hyperlipidemia as evidenced byimproved lipids patient will demonstrate understanding of rationale for each prescribed medication as evidenced by adherence the patient will work with the care management team towards completion of advanced directives Interventions: Collaboration with Megan Good, Megan Patricia, MD regarding development and update of comprehensive plan of care as evidenced by provider attestation and co-signature Inter-disciplinary care team collaboration (see longitudinal plan of care) Medication review performed; medication list updated in electronic medical record.  Inter-disciplinary care team collaboration (see longitudinal plan of care) Evaluation of current treatment plan related to hyperlipidemia and patient's adherence to plan as established by provider. Reinforced  to gradually increase exercise and follow health healthy diet Resent education to patient and/or caregiver about advanced directives Reinforced education to patient re: hyperlipidemia Reviewed scheduled/upcoming provider appointments including: Mammogram 12/20/20, no primary care provider scheduled Discussed plans with patient for ongoing care management follow up and provided patient with direct contact information for care management team Discussed eating unsalted nuts to help increase her HDL Patient Goals/Self-Care Activities: - keep a list of all the medicines I take; vitamins and herbals too - change to whole grain breads, cereal, pasta - drink 6 to 8 glasses of water each day - eat 3 to 5 servings of fruits and vegetables each day - manage portion size - learn my personal risk factors - change to whole grain breads, cereal, pasta - eat smaller or less servings of red  meat - fill half the plate with nonstarchy vegetables - get blood test (fasting) done 1 week before next visit - increase the amount of fiber in food - read food labels for fat and fiber  Follow Up Plan: Telephone follow up appointment with care management team member scheduled for: 02/19/21 at 2 PM The patient has been provided with contact information for the care management team and has been advised to call with any health related questions or concerns.        Plan:Telephone follow up appointment with care management team member scheduled for:  02/19/21 and The patient has been provided with contact information for the care management team and has been advised to call with any health related questions or concerns.  Dudley Major RN, Maximiano Coss, CDE Care Management Coordinator [facility] [phone], Mobile [phone]

## 2020-12-20 ENCOUNTER — Ambulatory Visit
Admission: RE | Admit: 2020-12-20 | Discharge: 2020-12-20 | Disposition: A | Discharge status: Home / Self Care | Payer: Medicare PPO | Source: Ambulatory Visit | Attending: Internal Medicine | Admitting: Internal Medicine

## 2020-12-20 ENCOUNTER — Other Ambulatory Visit: Payer: Self-pay

## 2020-12-20 DIAGNOSIS — A4901 Methicillin susceptible Staphylococcus aureus infection, unspecified site: Secondary | ICD-10-CM

## 2020-12-20 DIAGNOSIS — Z1231 Encounter for screening mammogram for malignant neoplasm of breast: Secondary | ICD-10-CM | POA: Diagnosis not present

## 2020-12-20 DIAGNOSIS — B957 Other staphylococcus as the cause of diseases classified elsewhere: Secondary | ICD-10-CM

## 2021-01-02 ENCOUNTER — Telehealth: Payer: Self-pay | Admitting: Internal Medicine

## 2021-01-02 DIAGNOSIS — R202 Paresthesia of skin: Secondary | ICD-10-CM

## 2021-01-02 DIAGNOSIS — R200 Anesthesia of skin: Secondary | ICD-10-CM

## 2021-01-02 MED ORDER — GABAPENTIN 100 MG PO CAPS: ORAL_CAPSULE | ORAL | 1 refills | Status: DC

## 2021-01-02 NOTE — Addendum Note (Signed)
Addended by: Kern Reap B on: 01/02/2021 04:42 PM   Modules accepted: Orders

## 2021-01-02 NOTE — Telephone Encounter (Signed)
Pt is calling in needing to increase her Rx gabapentin to 200 MG pt stated that the 100 MG is not helping and that the provider stated that she can call to let her know how the medication is doing for her.   Pharm:  [facility] [address].

## 2021-01-02 NOTE — Telephone Encounter (Signed)
Okay to increase?

## 2021-01-02 NOTE — Telephone Encounter (Signed)
Rx sent 

## 2021-01-05 ENCOUNTER — Other Ambulatory Visit: Payer: Self-pay

## 2021-01-05 ENCOUNTER — Ambulatory Visit (INDEPENDENT_AMBULATORY_CARE_PROVIDER_SITE_OTHER)
Admission: RE | Admit: 2021-01-05 | Discharge: 2021-01-05 | Disposition: A | Discharge status: Home / Self Care | Payer: Medicare PPO | Source: Ambulatory Visit | Attending: Internal Medicine | Admitting: Internal Medicine

## 2021-01-05 DIAGNOSIS — Z780 Asymptomatic menopausal state: Secondary | ICD-10-CM

## 2021-01-05 DIAGNOSIS — Z13820 Encounter for screening for osteoporosis: Secondary | ICD-10-CM

## 2021-01-09 ENCOUNTER — Encounter: Payer: Self-pay | Admitting: Internal Medicine

## 2021-01-09 DIAGNOSIS — M810 Age-related osteoporosis without current pathological fracture: Secondary | ICD-10-CM | POA: Insufficient documentation

## 2021-01-10 ENCOUNTER — Other Ambulatory Visit: Payer: Self-pay

## 2021-01-10 ENCOUNTER — Telehealth: Payer: Self-pay | Admitting: Internal Medicine

## 2021-01-10 NOTE — Telephone Encounter (Signed)
PT called to inquire about a stye on her eye and would like to know what to do or what type of cream she can use to take care of it. Offered apt but PT just wanted advise on what to do or use.

## 2021-01-10 NOTE — Telephone Encounter (Signed)
Spoke with patient and an office visit is scheduled for 01/11/21.

## 2021-01-11 ENCOUNTER — Ambulatory Visit: Payer: Medicare PPO | Admitting: Internal Medicine

## 2021-01-11 ENCOUNTER — Encounter: Payer: Self-pay | Admitting: Internal Medicine

## 2021-01-11 VITALS — BP 102/64 | HR 78 | Temp 98.2°F | Wt 129.9 lb

## 2021-01-11 DIAGNOSIS — H00014 Hordeolum externum left upper eyelid: Secondary | ICD-10-CM

## 2021-01-11 DIAGNOSIS — M810 Age-related osteoporosis without current pathological fracture: Secondary | ICD-10-CM

## 2021-01-11 NOTE — Progress Notes (Signed)
Established Patient Office Visit     This visit occurred during the SARS-CoV-2 public health emergency.  Safety protocols were in place, including screening questions prior to the visit, additional usage of staff PPE, and extensive cleaning of exam room while observing appropriate contact time as indicated for disinfecting solutions.    CC/Reason for Visit: Follow results of DEXA scan, stye  HPI: Megan Good is a 70 y.o. female who is coming in today for the above mentioned reasons.  She is here to follow-up on the results of her DEXA scan which showed osteoporosis with a lumbar spine T score of -2.7, right femoral neck -2.2 and left femoral neck -2.6.  She is concerned because she has had difficulty swallowing pills in the past.  She is aware of the possibility of caustic esophagitis with bisphosphonates.  For the past day she has had a swollen left upper eyelid with a stye.  Past Medical/Surgical History: Past Medical History:  Diagnosis Date   Headache     Past Surgical History:  Procedure Laterality Date   BREAST BIOPSY Left 2003   TONSILLECTOMY      Social History:  reports that she has never smoked. She has never used smokeless tobacco. She reports that she does not drink alcohol and does not use drugs.  Allergies: Allergies  Allergen Reactions   Penicillins Swelling and Rash    Has patient had a PCN reaction causing immediate rash, facial/tongue/throat swelling, SOB or lightheadedness with hypotension: No Has patient had a PCN reaction causing severe rash involving mucus membranes or skin necrosis: No Has patient had a PCN reaction that required hospitalization: No Has patient had a PCN reaction occurring within the last 10 years: No If all of the above answers are "NO", then may proceed with Cephalosporin use.  Burn-like rash    Family History:  Family History  Problem Relation Age of Onset   Healthy Mother 46   Other Father        MVA   Stroke  Maternal Grandmother      Current Outpatient Medications:    gabapentin (NEURONTIN) 100 MG capsule, Take 2 tabs daily, Disp: 180 capsule, Rfl: 1   Multiple Vitamins-Minerals (MULTIVITAMIN ADULTS PO), Take 1 tablet by mouth daily., Disp: , Rfl:    rosuvastatin (CRESTOR) 10 MG tablet, Take 1 tablet (10 mg total) by mouth daily., Disp: 90 tablet, Rfl: 1  Review of Systems:  Constitutional: Denies fever, chills, diaphoresis, appetite change and fatigue.  HEENT: Denies photophobia, eye pain, redness, hearing loss, ear pain, congestion, sore throat, rhinorrhea, sneezing, mouth sores, trouble swallowing, neck pain, neck stiffness and tinnitus.   Respiratory: Denies SOB, DOE, cough, chest tightness,  and wheezing.   Cardiovascular: Denies chest pain, palpitations and leg swelling.  Gastrointestinal: Denies nausea, vomiting, abdominal pain, diarrhea, constipation, blood in stool and abdominal distention.  Genitourinary: Denies dysuria, urgency, frequency, hematuria, flank pain and difficulty urinating.  Endocrine: Denies: hot or cold intolerance, sweats, changes in hair or nails, polyuria, polydipsia. Musculoskeletal: Denies myalgias, back pain, joint swelling, arthralgias and gait problem.  Skin: Denies pallor, rash and wound.  Neurological: Denies dizziness, seizures, syncope, weakness, light-headedness, numbness and headaches.  Hematological: Denies adenopathy. Easy bruising, personal or family bleeding history  Psychiatric/Behavioral: Denies suicidal ideation, mood changes, confusion, nervousness, sleep disturbance and agitation    Physical Exam: Vitals:   01/11/21 1325  BP: 102/64  Pulse: 78  Temp: 98.2 F (36.8 C)  TempSrc: Oral  SpO2: 98%  Weight: 129 lb 14.4 oz (58.9 kg)    Body mass index is 23.38 kg/m.   Constitutional: NAD, calm, comfortable Eyes: PERRL, edema of the left upper eyelid with a stye ENMT: Mucous membranes are moist.  Psychiatric: Normal judgment and  insight. Alert and oriented x 3. Normal mood.    Impression and Plan:  Age-related osteoporosis without current pathological fracture -We have agreed to start bisphosphonate therapy.  She is concerned about risk of esophagitis as she has difficulty swallowing pills in general. -We will see about getting her approved for Prolia.  Hordeolum externum of left upper eyelid -Advised massages with warm compress.    Chaya Jan, MD [facility]

## 2021-01-15 ENCOUNTER — Telehealth: Payer: Self-pay

## 2021-01-15 NOTE — Telephone Encounter (Signed)
Benefits verification started for pt to see cost of Prolia.

## 2021-01-17 NOTE — Telephone Encounter (Signed)
Benefits verification received, [employer] requires PA. Form filled out & faxed into insurance. Awaiting response before scheduling.

## 2021-01-24 ENCOUNTER — Telehealth: Payer: Self-pay

## 2021-01-24 NOTE — Telephone Encounter (Signed)
Patient returned call and was informed of response from Dr. Ardyth Harps, pt stated she will continue with warm compress and if it doesn't get better she will call for the referral.

## 2021-01-24 NOTE — Telephone Encounter (Signed)
Left message on machine for patient to return our call 

## 2021-01-24 NOTE — Telephone Encounter (Signed)
Patient called stating she was seen a few weeks ago about stye on her eye and after treating it the stye will come and go pt wants to know what she should do.

## 2021-01-29 NOTE — Telephone Encounter (Signed)
I left patient a voicemail to return my call. 

## 2021-01-29 NOTE — Telephone Encounter (Signed)
Received benefits verification & PA is approved. Estimated cost for patient is $265.

## 2021-01-30 DIAGNOSIS — H00024 Hordeolum internum left upper eyelid: Secondary | ICD-10-CM | POA: Diagnosis not present

## 2021-02-06 ENCOUNTER — Telehealth: Payer: Self-pay

## 2021-02-06 NOTE — Telephone Encounter (Signed)
I spoke with patient, we are going to wait on the Prolia for now.

## 2021-02-06 NOTE — Telephone Encounter (Signed)
I called and spoke with patient in regards to the cost of the Prolia, we are going to table this for now due to the cost.   Patient wanted to update pcp on her stye. She is on a eye drop for antibiotics and it is starting to look better. The eye doctor found another one on the lid that they ended up having to lance. She is on the last day of her antibiotic.  Patient has started to experience some dizziness since starting the antibiotic. She is unsure if it's related to the fact that she is on gabapentin and her low dose cholesterol medication. This did not start until she also started taking the eye drops.   I advised patient that I would send this message over to pcp for advise.

## 2021-02-07 NOTE — Telephone Encounter (Signed)
Patient is aware 

## 2021-02-19 ENCOUNTER — Ambulatory Visit (INDEPENDENT_AMBULATORY_CARE_PROVIDER_SITE_OTHER): Payer: Medicare PPO

## 2021-02-19 DIAGNOSIS — M5410 Radiculopathy, site unspecified: Secondary | ICD-10-CM

## 2021-02-19 DIAGNOSIS — M810 Age-related osteoporosis without current pathological fracture: Secondary | ICD-10-CM

## 2021-02-19 DIAGNOSIS — E785 Hyperlipidemia, unspecified: Secondary | ICD-10-CM

## 2021-02-19 NOTE — Patient Instructions (Signed)
Visit Information  PATIENT GOALS:  Goals Addressed             This Visit's Progress    Cope with Chronic Pain   On track    Timeframe:  Long-Range Goal Priority:  Medium Start Date:        10/31/20                     Expected End Date:    08/08/21                   Follow Up Date  04/17/21    - learn how to meditate - learn relaxation techniques - practice relaxation or meditation daily - think of new ways to do favorite things - use distraction techniques    Why is this important?   Stress makes chronic pain feel worse.  Feelings like depression, anxiety, stress and anger can make your body more sensitive to pain.  Learning ways to cope with stress or depression may help you find some relief from the pain.     Notes:      Manage My Cholesterol   On track    Timeframe:  Long-Range Goal Priority:  Medium Start Date:     10/31/20                        Expected End Date:     08/08/21                  Follow Up Date 04/17/21    - change to whole grain breads, cereal, pasta - eat smaller or less servings of red meat - fill half the plate with nonstarchy vegetables - get blood test (fasting) done 1 week before next visit - increase the amount of fiber in food - read food labels for fat and fiber    Why is this important?   Changing cholesterol starts with eating heart-healthy foods.  Other steps may be to increase your activity and to quit if you smoke.    Notes:       Osteoporosis Osteoporosis is when the bones get thin and weak. This can cause your bones to break (fracture) more easily. What are the causes? The exact cause of this condition is not known. What increases the risk? Having family members with this condition. Not eating enough healthy foods. Taking certain medicines. Being female. Being age 78 or older. Smoking or using other products that contain nicotine or tobacco, such as e-cigarettes or chewing tobacco. Not exercising. Being of European or  Asian ancestry. Having a small body frame. What are the signs or symptoms? A broken bone might be the first sign, especially if the break results from a fall or injury that usually would not cause a bone to break. Other signs and symptoms include: Pain in the neck or low back. Being hunched over (stooped posture). Getting shorter. How is this treated? Eating more foods with more calcium and vitamin D in them. Doing exercises. Stopping tobacco use. Limiting how much alcohol you drink. Taking medicines to slow bone loss or help make the bones stronger. Taking supplements of calcium and vitamin D every day. Taking medicines to replace chemicals in the body (hormone replacement medicines). Monitoring your levels of calcium and vitamin D. The goal of treatment is to strengthen your bones and lower your risk for a bone break. Follow these instructions at home: Eating and drinking Eat plenty of  calcium and vitamin D. These nutrients are good for your bones. Good sources of calcium and vitamin D include: Some fish, such as salmon and tuna. Foods that have calcium and vitamin D added to them (fortified foods), such as some breakfast cereals. Egg yolks. Cheese. Liver.  Activity Do exercises as told by your doctor. Ask your doctor what exercises are safe for you. You should do: Exercises that make your muscles work to hold your body weight up (weight-bearing exercises). These include tai chi, yoga, and walking. Exercises to make your muscles stronger. One example is lifting weights. Lifestyle Do not drink alcohol if: Your doctor tells you not to drink. You are pregnant, may be pregnant, or are planning to become pregnant. If you drink alcohol: Limit how much you use to: 0-1 drink a day for women. 0-2 drinks a day for men. Know how much alcohol is in your drink. In the [place] one drink equals one 12 oz bottle of beer (355 mL), one 5 oz glass of wine (148 mL), or one 1 oz glass of hard  liquor (44 mL). Do not smoke or use any products that contain nicotine or tobacco. If you need help quitting, ask your doctor. Preventing falls Use tools to help you move around (mobility aids) as needed. These include canes, walkers, scooters, and crutches. Keep rooms well-lit. Put away things on the floor that could make you trip. These include cords and rugs. Install safety rails on stairs. Install grab bars in bathrooms. Use rubber mats in slippery areas, like bathrooms. Wear shoes that: Fit you well. Support your feet. Have closed toes. Have rubber soles or low heels. Tell your doctor about all of the medicines you are taking. Some medicines can make you more likely to fall. General instructions Take over-the-counter and prescription medicines only as told by your doctor. Keep all follow-up visits. Contact a doctor if: You have not been tested (screened) for osteoporosis and you are: A woman who is age 66 or older. A man who is age 90 or older. Get help right away if: You fall. You get hurt. Summary Osteoporosis happens when your bones get thin and weak. Weak bones can break (fracture) more easily. Eat plenty of calcium and vitamin D. These are good for your bones. Tell your doctor about all of the medicines that you take. This information is not intended to replace advice given to you by your health care provider. Make sure you discuss any questions you have with your health care provider. Document Revised: 11/11/2019 Document Reviewed: 11/11/2019 Elsevier Patient Education  2022 [place].   Patient verbalizes understanding of instructions provided today and agrees to view in [place].   Telephone follow up appointment with care management team member scheduled for: 04/17/21 at 3:30 PM  [place], [facility], CDE Care Management Coordinator [facility] [phone], Mobile [phone]

## 2021-02-19 NOTE — Chronic Care Management (AMB) (Signed)
Chronic Care Management   CCM RN Visit Note  02/19/2021 Name: Megan Good MRN: [id number] DOB: [birth year]  Subjective: Megan Good is a 70 y.o. year old female who is a primary care patient of Philip Aspen, Limmie Patricia, MD. The care management team was consulted for assistance with disease management and care coordination needs.    Engaged with patient by telephone for follow up visit in response to provider referral for case management and/or care coordination services.   Consent to Services:  The patient was given information about Chronic Care Management services, agreed to services, and gave verbal consent prior to initiation of services.  Please see initial visit note for detailed documentation.   Patient agreed to services and verbal consent obtained.   Assessment: Review of patient past medical history, allergies, medications, health status, including review of consultants reports, laboratory and other test data, was performed as part of comprehensive evaluation and provision of chronic care management services.   SDOH (Social Determinants of Health) assessments and interventions performed:    CCM Care Plan  Allergies  Allergen Reactions   Penicillins Swelling and Rash    Has patient had a PCN reaction causing immediate rash, facial/tongue/throat swelling, SOB or lightheadedness with hypotension: No Has patient had a PCN reaction causing severe rash involving mucus membranes or skin necrosis: No Has patient had a PCN reaction that required hospitalization: No Has patient had a PCN reaction occurring within the last 10 years: No If all of the above answers are "NO", then may proceed with Cephalosporin use.  Burn-like rash    Outpatient Encounter Medications as of 02/19/2021  Medication Sig   gabapentin (NEURONTIN) 100 MG capsule Take 2 tabs daily   Multiple Vitamins-Minerals (MULTIVITAMIN ADULTS PO) Take 1 tablet by mouth daily.   rosuvastatin (CRESTOR)  10 MG tablet Take 1 tablet (10 mg total) by mouth daily.   [DISCONTINUED] pantoprazole (PROTONIX) 20 MG tablet Take 1 tablet (20 mg total) by mouth daily. (Patient not taking: Reported on 03/30/2020)   No facility-administered encounter medications on file as of 02/19/2021.    Patient Active Problem List   Diagnosis Date Noted   Osteoporosis 01/09/2021   GERD (gastroesophageal reflux disease) 12/07/2019   Hyperlipidemia 12/07/2019   Subclinical hyperthyroidism 12/07/2019   Radicular neuropathy 09/02/2019    Conditions to be addressed/monitored:HLD and neuropathy, osteoporosis  Care Plan : RNCM:Chronic Pain (Adult)  Updates made by Yetta Glassman, RN since 02/19/2021 12:00 AM     Problem: Chronic Pain Management (Chronic Pain)   Priority: Medium     Long-Range Goal: Chronic Pain Managed   Start Date: 10/31/2020  Expected End Date: 08/08/2021  This Visit's Progress: On track  Recent Progress: On track  Priority: Medium  Note:   Current Barriers:  Knowledge Deficits related to self-health management of acute or chronic pain -Radicular neuropathy Chronic Disease Management support and education needs related to chronic pain-Radicular neuropathy Knowledge Deficits related to self management of Radicular neuropathy Chronic Disease Management support and education needs related to Radicular neuropathy No Advanced Directives in place Lacks social connections States that her pain level varies depending if she has over done her activity on her good days.  States rest does help with her pain if she has over done it.  States she was going to the gym until she got the stye in her eye.  States she now has a dog that she walks twice a day daily.  States she sometimes gets numbness in her  feet and legs that comes and goes.  States she did get the Advanced Directives  packet but she has not reviewed it yet Clinical Goal(s):  patient will verbalize understanding of plan for pain management. ,  patient will meet with RN Care Manager to address self management of Radicular neuropathy, patient will attend all scheduled medical appointments: no primary care provider scheudled, patient will demonstrate use of different relaxation  skills and/or diversional activities to assist with pain reduction (distraction, imagery, relaxation, massage, acupressure, TENS, heat, and cold application., patient will report pain at a level less than 3 to 4 on a 10-10 rating scale., patient will use pharmacological and nonpharmacological pain relief strategies as prescribed. , and patient will engage in desired activities without an increase in pain level Interventions:  Collaboration with Philip Aspen, Limmie Patricia, MD regarding development and update of comprehensive plan of care as evidenced by provider attestation and co-signature Pain assessment performed Medications reviewed Discussed plans with patient for ongoing care management follow up and provided patient with direct contact information for care management team Evaluation of current treatment plan related to Radicular neuropathy and patient's adherence to plan as established by provider. Encouraged patient to get regular exercise and to slowly progress to prevent injury Reinforced education to patient and/or caregiver about advanced directives and to review packet-received Advanced Directives  packet Reinforced education to patient re: Radicular neuropathy Reviewed medications with patient and discussed adherence and to get timely refills Reviewed scheduled/upcoming provider appointments including:  no primary care provider scheduled Discussed plans with patient for ongoing care management follow up and provided patient with direct contact information for care management team Reinforced to pace her activity and to not over do it in the heat Reviewed to notify provider if pain or numbness worsens Patient Goals/Self Care Activities:  Will  self-administer medications as prescribed Will attend all scheduled provider appointments Will call pharmacy for medication refills 7 days prior to needed refill date Patient will calls provider office for new concerns or questions Follow Up Plan: Telephone follow up appointment with care management team member scheduled for: 04/17/21 at 3:30 PM The patient has been provided with contact information for the care management team and has been advised to call with any health related questions or concerns.       Care Plan : RNCM:Hyperlipidemia  Updates made by Yetta Glassman, RN since 02/19/2021 12:00 AM     Problem: Lack of self management of Hyperlipidemia   Priority: Medium     Long-Range Goal: Effective self management of Hyperlipidemia   Start Date: 10/31/2020  Expected End Date: 08/08/2021  This Visit's Progress: On track  Recent Progress: On track  Priority: Medium  Note:   Current Barriers:  Poorly controlled hyperlipidemia, complicated by hypothyroidism, Radicular neuropathy Current antihyperlipidemic regimen: Rosuvastatin Most recent lipid panel:     Component Value Date/Time   CHOL 161 10/10/2020 0754   TRIG 66.0 10/10/2020 0754   HDL 54.90 10/10/2020 0754   CHOLHDL 3 10/10/2020 0754   VLDL 13.2 10/10/2020 0754   LDLCALC 93 10/10/2020 0754   LDLCALC 161 (H) 03/09/2020 0754   ASCVD risk enhancing conditions: age >21 Unable to independently self manage hyperlipidemia Lacks social connections  States she is still working hard to follow a plant based diet especially for breakfast.  States she limits the meat she eats and is eating more fruits and vegetables.  States she is walking her dog for apx 1 mile a day as her pain allows.  States her eye stye is better now and she plans to go back to the [facility]. States she did  receive the Environmental education officer Clinical Goal(s):  patient will work with Medical illustrator, providers, and care team towards execution  of optimized self-health management plan patient will verbalize understanding of plan for hyperlipidemia patient will meet with RN Care Manager to address hyperlipidemia patient will demonstrate improved adherence to prescribed treatment plan for hyperlipidemia as evidenced byimproved lipids patient will demonstrate understanding of rationale for each prescribed medication as evidenced by adherence the patient will work with the care management team towards completion of advanced directives Interventions: Collaboration with Philip Aspen, Limmie Patricia, MD regarding development and update of comprehensive plan of care as evidenced by provider attestation and co-signature Inter-disciplinary care team collaboration (see longitudinal plan of care) Medication review performed; medication list updated in electronic medical record.  Inter-disciplinary care team collaboration (see longitudinal plan of care) Evaluation of current treatment plan related to hyperlipidemia and patient's adherence to plan as established by provider. Reinforced  to gradually increase exercise and follow health healthy diet Reviewed education to patient and/or caregiver about advanced directives Reinforced education to patient re: hyperlipidemia Reviewed scheduled/upcoming provider appointments including:no primary care provider scheduled Discussed plans with patient for ongoing care management follow up and provided patient with direct contact information for care management team Reinforced  eating unsalted nuts to help increase her HDL Patient Goals/Self-Care Activities: - keep a list of all the medicines I take; vitamins and herbals too - change to whole grain breads, cereal, pasta - drink 6 to 8 glasses of water each day - eat 3 to 5 servings of fruits and vegetables each day - manage portion size - learn my personal risk factors - change to whole grain breads, cereal, pasta - eat smaller or less servings of red  meat - fill half the plate with nonstarchy vegetables - get blood test (fasting) done 1 week before next visit - increase the amount of fiber in food - read food labels for fat and fiber Follow Up Plan: Telephone follow up appointment with care management team member scheduled for: 04/17/21 at 3:30 PM The patient has been provided with contact information for the care management team and has been advised to call with any health related questions or concerns.        Plan:Telephone follow up appointment with care management team member scheduled for:  04/17/21 and The patient has been provided with contact information for the care management team and has been advised to call with any health related questions or concerns.  Dudley Major RN, Maximiano Coss, CDE Care Management Coordinator [facility] [phone], Mobile [phone]

## 2021-03-09 DIAGNOSIS — E785 Hyperlipidemia, unspecified: Secondary | ICD-10-CM

## 2021-03-09 DIAGNOSIS — M810 Age-related osteoporosis without current pathological fracture: Secondary | ICD-10-CM

## 2021-04-17 ENCOUNTER — Ambulatory Visit (INDEPENDENT_AMBULATORY_CARE_PROVIDER_SITE_OTHER): Payer: Medicare PPO

## 2021-04-17 DIAGNOSIS — E785 Hyperlipidemia, unspecified: Secondary | ICD-10-CM

## 2021-04-17 DIAGNOSIS — M5410 Radiculopathy, site unspecified: Secondary | ICD-10-CM

## 2021-04-17 NOTE — Patient Instructions (Signed)
Visit Information earn how to meditate - learn relaxation techniques - practice relaxation or meditation daily - think of new ways to do favorite things - use distraction techniques change to whole grain breads, cereal, pasta - eat smaller or less servings of red meat - fill half the plate with non-starchy vegetables - get blood test (fasting) done 1 week before next visit - increase the amount of fiber in food - read food labels for fat and fiber - switch to low-fat or skim milk  Patient verbalizes understanding of instructions provided today and agrees to view in MyChart.   Telephone follow up appointment with care management team member scheduled for: 06/26/21 at 2 PM [facility] RN, [facility], CDE Care Management Coordinator [facility] [phone], Mobile [phone]

## 2021-04-17 NOTE — Chronic Care Management (AMB) (Signed)
Chronic Care Management   CCM RN Visit Note  04/17/2021 Name: Megan Good MRN: [id number] DOB: 02-14-1951  Subjective: Megan Good is a 70 y.o. year old female who is a primary care patient of Philip Aspen, Limmie Patricia, MD. The care management team was consulted for assistance with disease management and care coordination needs.    Engaged with patient by telephone for follow up visit in response to provider referral for case management and/or care coordination services.   Consent to Services:  The patient was given information about Chronic Care Management services, agreed to services, and gave verbal consent prior to initiation of services.  Please see initial visit note for detailed documentation.   Patient agreed to services and verbal consent obtained.   Assessment: Review of patient past medical history, allergies, medications, health status, including review of consultants reports, laboratory and other test data, was performed as part of comprehensive evaluation and provision of chronic care management services.   SDOH (Social Determinants of Health) assessments and interventions performed:    CCM Care Plan  Allergies  Allergen Reactions   Penicillins Swelling and Rash    Has patient had a PCN reaction causing immediate rash, facial/tongue/throat swelling, SOB or lightheadedness with hypotension: No Has patient had a PCN reaction causing severe rash involving mucus membranes or skin necrosis: No Has patient had a PCN reaction that required hospitalization: No Has patient had a PCN reaction occurring within the last 10 years: No If all of the above answers are "NO", then may proceed with Cephalosporin use.  Burn-like rash    Outpatient Encounter Medications as of 04/17/2021  Medication Sig   gabapentin (NEURONTIN) 100 MG capsule Take 2 tabs daily   Multiple Vitamins-Minerals (MULTIVITAMIN ADULTS PO) Take 1 tablet by mouth daily.   rosuvastatin (CRESTOR)  10 MG tablet Take 1 tablet (10 mg total) by mouth daily.   [DISCONTINUED] pantoprazole (PROTONIX) 20 MG tablet Take 1 tablet (20 mg total) by mouth daily. (Patient not taking: Reported on 03/30/2020)   No facility-administered encounter medications on file as of 04/17/2021.    Patient Active Problem List   Diagnosis Date Noted   Osteoporosis 01/09/2021   GERD (gastroesophageal reflux disease) 12/07/2019   Hyperlipidemia 12/07/2019   Subclinical hyperthyroidism 12/07/2019   Radicular neuropathy 09/02/2019    Conditions to be addressed/monitored:HLD and Radicular neuropathy  Care Plan : RNCM:Chronic Pain (Adult)  Updates made by Yetta Glassman, RN since 04/17/2021 12:00 AM  Completed 04/17/2021   Problem: Chronic Pain Management (Chronic Pain) Resolved 04/17/2021  Priority: Medium     Long-Range Goal: Chronic Pain Managed Completed 04/17/2021  Start Date: 10/31/2020  Expected End Date: 08/08/2021  Recent Progress: On track  Priority: Medium  Note:   Resolving due to duplicate goal Current Barriers:  Knowledge Deficits related to self-health management of acute or chronic pain -Radicular neuropathy Chronic Disease Management support and education needs related to chronic pain-Radicular neuropathy Knowledge Deficits related to self management of Radicular neuropathy Chronic Disease Management support and education needs related to Radicular neuropathy No Advanced Directives in place Lacks social connections States that her pain level varies depending if she has over done her activity on her good days.  States rest does help with her pain if she has over done it.  States she was going to the gym until she got the stye in her eye.  States she now has a dog that she walks twice a day daily.  States she sometimes gets numbness  in her feet and legs that comes and goes.  States she did get the Advanced Directives  packet but she has not reviewed it yet Clinical Goal(s):  patient will  verbalize understanding of plan for pain management. , patient will meet with RN Care Manager to address self management of Radicular neuropathy, patient will attend all scheduled medical appointments: no primary care provider scheudled, patient will demonstrate use of different relaxation  skills and/or diversional activities to assist with pain reduction (distraction, imagery, relaxation, massage, acupressure, TENS, heat, and cold application., patient will report pain at a level less than 3 to 4 on a 10-10 rating scale., patient will use pharmacological and nonpharmacological pain relief strategies as prescribed. , and patient will engage in desired activities without an increase in pain level Interventions:  Collaboration with Philip Aspen, Limmie Patricia, MD regarding development and update of comprehensive plan of care as evidenced by provider attestation and co-signature Pain assessment performed Medications reviewed Discussed plans with patient for ongoing care management follow up and provided patient with direct contact information for care management team Evaluation of current treatment plan related to Radicular neuropathy and patient's adherence to plan as established by provider. Encouraged patient to get regular exercise and to slowly progress to prevent injury Reinforced education to patient and/or caregiver about advanced directives and to review packet-received Advanced Directives  packet Reinforced education to patient re: Radicular neuropathy Reviewed medications with patient and discussed adherence and to get timely refills Reviewed scheduled/upcoming provider appointments including:  no primary care provider scheduled Discussed plans with patient for ongoing care management follow up and provided patient with direct contact information for care management team Reinforced to pace her activity and to not over do it in the heat Reviewed to notify provider if pain or numbness  worsens Patient Goals/Self Care Activities:  Will self-administer medications as prescribed Will attend all scheduled provider appointments Will call pharmacy for medication refills 7 days prior to needed refill date Patient will calls provider office for new concerns or questions Follow Up Plan: Telephone follow up appointment with care management team member scheduled for: 04/17/21 at 3:30 PM The patient has been provided with contact information for the care management team and has been advised to call with any health related questions or concerns.       Care Plan : RNCM:Hyperlipidemia  Updates made by Yetta Glassman, RN since 04/17/2021 12:00 AM  Completed 04/17/2021   Problem: Lack of self management of Hyperlipidemia Resolved 04/17/2021  Priority: Medium     Long-Range Goal: Effective self management of Hyperlipidemia Completed 04/17/2021  Start Date: 10/31/2020  Expected End Date: 08/08/2021  Recent Progress: On track  Priority: Medium  Note:   Resolving due to duplicate goal Current Barriers:  Poorly controlled hyperlipidemia, complicated by hypothyroidism, Radicular neuropathy Current antihyperlipidemic regimen: Rosuvastatin Most recent lipid panel:     Component Value Date/Time   CHOL 161 10/10/2020 0754   TRIG 66.0 10/10/2020 0754   HDL 54.90 10/10/2020 0754   CHOLHDL 3 10/10/2020 0754   VLDL 13.2 10/10/2020 0754   LDLCALC 93 10/10/2020 0754   LDLCALC 161 (H) 03/09/2020 0754   ASCVD risk enhancing conditions: age >65 Unable to independently self manage hyperlipidemia Lacks social connections  States she is still working hard to follow a plant based diet especially for breakfast.  States she limits the meat she eats and is eating more fruits and vegetables.  States she is walking her dog for apx 1 mile a day  as her pain allows.  States her eye stye is better now and she plans to go back to the [facility]. States she did  receive the Insurance claims handler Clinical Goal(s):  patient will work with Medical illustrator, providers, and care team towards execution of optimized self-health management plan patient will verbalize understanding of plan for hyperlipidemia patient will meet with RN Care Manager to address hyperlipidemia patient will demonstrate improved adherence to prescribed treatment plan for hyperlipidemia as evidenced byimproved lipids patient will demonstrate understanding of rationale for each prescribed medication as evidenced by adherence the patient will work with the care management team towards completion of advanced directives Interventions: Collaboration with Philip Aspen, Limmie Patricia, MD regarding development and update of comprehensive plan of care as evidenced by provider attestation and co-signature Inter-disciplinary care team collaboration (see longitudinal plan of care) Medication review performed; medication list updated in electronic medical record.  Inter-disciplinary care team collaboration (see longitudinal plan of care) Evaluation of current treatment plan related to hyperlipidemia and patient's adherence to plan as established by provider. Reinforced  to gradually increase exercise and follow health healthy diet Reviewed education to patient and/or caregiver about advanced directives Reinforced education to patient re: hyperlipidemia Reviewed scheduled/upcoming provider appointments including:no primary care provider scheduled Discussed plans with patient for ongoing care management follow up and provided patient with direct contact information for care management team Reinforced  eating unsalted nuts to help increase her HDL Patient Goals/Self-Care Activities: - keep a list of all the medicines I take; vitamins and herbals too - change to whole grain breads, cereal, pasta - drink 6 to 8 glasses of water each day - eat 3 to 5 servings of fruits and vegetables each day - manage portion size - learn my  personal risk factors - change to whole grain breads, cereal, pasta - eat smaller or less servings of red meat - fill half the plate with nonstarchy vegetables - get blood test (fasting) done 1 week before next visit - increase the amount of fiber in food - read food labels for fat and fiber Follow Up Plan: Telephone follow up appointment with care management team member scheduled for: 04/17/21 at 3:30 PM The patient has been provided with contact information for the care management team and has been advised to call with any health related questions or concerns.       Care Plan : RN Care Manager Plan of Care  Updates made by Yetta Glassman, RN since 04/17/2021 12:00 AM     Problem: Chronic Disease Management and Care Coordination Needs (radicular neuropathy and HLD)   Priority: High     Long-Range Goal: Establish Plan of Care for Chronic Disease Management Needs (Radicular Neuropathy and HLD)   Start Date: 04/17/2021  Expected End Date: 10/14/2021  This Visit's Progress: On track  Priority: High  Note:   Current Barriers:  Chronic Disease Management support and education needs related to HLD and Radicular neuropathy No Advanced Directives in place States that her pain level still varies depending if she has over done her activity.  States rest does help with her pain if she has over done it.  States she is walking her dog twice a day daily for about an hour total each day  States she sometimes gets numbness in her feet and legs that comes and goes.  States she has not completed her  Advanced Directives.  States she is trying to eat healthy and states she  eats the Rainbow RNCM Clinical Goal(s):  Patient will verbalize basic understanding of  HLD and Radicular neuropathy disease process and self health management plan   take all medications exactly as prescribed and will call provider for medication related questions demonstrate Ongoing adherence to prescribed treatment plan for HLD and  Radicular neuropathy as evidenced by readings within limits and adherence to plan of care continue to work with RN Care Manager to address care management and care coordination needs related to  HLD and Radicular neuropathy collaborate with the care management team towards completion of advanced directives    through collaboration with RN Care manager, provider, and care team.   Interventions: 1:1 collaboration with primary care provider regarding development and update of comprehensive plan of care as evidenced by provider attestation and co-signature Inter-disciplinary care team collaboration (see longitudinal plan of care) Evaluation of current treatment plan related to  self management and patient's adherence to plan as established by provider Health Maintenance Interventions:  (Status:  New goal.)  Advised patient to discuss  Pneumonia Vaccine COVID vaccination    Advanced Directives    Hepatitis C test  with primary care provider  Patient provided CDC guideline information about COVID vaccine booster, Pneumonia vaccin   , and Shingles vaccine    Notified provider of adult immunization status and request for consideration of order for/scheduling of Pneumonia Vaccine Hepatitis C test Provided education about Advanced Directives, shingles vaccine and Hepatitis C testing   Resent Advanced Directives  packet per pt request Pain Interventions: Pain assessment performed Medications reviewed Reviewed provider established plan for pain management; Discussed importance of adherence to all scheduled medical appointments; Counseled on the importance of reporting any/all new or changed pain symptoms or management strategies to pain management provider; Advised patient to report to care team affect of pain on daily activities; Discussed use of relaxation techniques and/or diversional activities to assist with pain reduction (distraction, imagery, relaxation, massage, acupressure, TENS, heat, and  cold application;  Hyperlipidemia Interventions: Medication review performed; medication list updated in electronic medical record.  Provider established cholesterol goals reviewed; Counseled on importance of regular laboratory monitoring as prescribed; Reviewed role and benefits of statin for ASCVD risk reduction; Reviewed importance of limiting foods high in cholesterol; Reviewed exercise goals and target of 150 minutes per week;  Patient Goals/Self-Care Activities: Patient will self administer medications as prescribed Patient will attend all scheduled provider appointments Patient will call pharmacy for medication refills Patient will continue to perform ADL's independently Patient will continue to perform IADL's independently Patient will call provider office for new concerns or questions learn how to meditate - learn relaxation techniques - practice relaxation or meditation daily - think of new ways to do favorite things - use distraction techniques change to whole grain breads, cereal, pasta - eat smaller or less servings of red meat - fill half the plate with non-starchy vegetables - get blood test (fasting) done 1 week before next visit - increase the amount of fiber in food - read food labels for fat and fiber - switch to low-fat or skim milk Follow Up Plan:  Telephone follow up appointment with care management team member scheduled for:  06/26/21 The patient has been provided with contact information for the care management team and has been advised to call with any health related questions or concerns.       Plan:Telephone follow up appointment with care management team member scheduled for:  06/26/21 The patient has been provided with contact information for  the care management team and has been advised to call with any health related questions or concerns.  Dudley Major RN, Maximiano Coss, CDE Care Management Coordinator [facility] [phone],  Mobile [phone]

## 2021-05-09 DIAGNOSIS — E785 Hyperlipidemia, unspecified: Secondary | ICD-10-CM

## 2021-05-09 DIAGNOSIS — M5410 Radiculopathy, site unspecified: Secondary | ICD-10-CM

## 2021-05-18 ENCOUNTER — Encounter: Payer: Self-pay | Admitting: Internal Medicine

## 2021-06-14 ENCOUNTER — Telehealth: Payer: Self-pay | Admitting: Internal Medicine

## 2021-06-14 ENCOUNTER — Other Ambulatory Visit: Payer: Self-pay

## 2021-06-14 ENCOUNTER — Ambulatory Visit (AMBULATORY_SURGERY_CENTER): Payer: Medicare PPO | Admitting: *Deleted

## 2021-06-14 VITALS — Ht 62.5 in | Wt 127.0 lb

## 2021-06-14 DIAGNOSIS — Z1211 Encounter for screening for malignant neoplasm of colon: Secondary | ICD-10-CM

## 2021-06-14 MED ORDER — NA SULFATE-K SULFATE-MG SULF 17.5-3.13-1.6 GM/177ML PO SOLN: 1.0000 | Freq: Once | ORAL | 0 refills | Status: AC

## 2021-06-14 NOTE — Telephone Encounter (Signed)
Suprep  not covered by [facility] per Pharmacy   Changed to Miralax per [place] [employer]- new instructions to my chart and mailed to pt  Called pt- went over Miralax prep instructions with her

## 2021-06-14 NOTE — Progress Notes (Signed)

## 2021-06-14 NOTE — Telephone Encounter (Signed)
Patient called states her insurance will not cover the prep medication please send an alternative and call the patient so she knows its been done.

## 2021-06-26 ENCOUNTER — Ambulatory Visit (INDEPENDENT_AMBULATORY_CARE_PROVIDER_SITE_OTHER): Payer: Medicare PPO

## 2021-06-26 DIAGNOSIS — R200 Anesthesia of skin: Secondary | ICD-10-CM

## 2021-06-26 DIAGNOSIS — E785 Hyperlipidemia, unspecified: Secondary | ICD-10-CM

## 2021-06-26 DIAGNOSIS — M5410 Radiculopathy, site unspecified: Secondary | ICD-10-CM

## 2021-06-26 NOTE — Patient Instructions (Signed)
Visit Information  Thank you for taking time to visit with me today. Please don't hesitate to contact me if I can be of assistance to you before our next scheduled telephone appointment.  Following are the goals we discussed today:  Take all medications as prescribed Attend all scheduled provider appointments Call pharmacy for medication refills 3-7 days in advance of running out of medications Perform all self care activities independently  Perform IADL's (shopping, preparing meals, housekeeping, managing finances) independently Call provider office for new concerns or questions  call for medicine refill 2 or 3 days before it runs out take all medications exactly as prescribed call doctor with any symptoms you believe are related to your medicine adhere to prescribed diet: low fat heart healthy develop an exercise routine earn how to meditate - learn relaxation techniques - practice relaxation or meditation daily - think of new ways to do favorite things - use distraction techniques change to whole grain breads, cereal, pasta - eat smaller or less servings of red meat - fill half the plate with non-starchy vegetables - get blood test (fasting) done 1 week before next visit - increase the amount of fiber in food - read food labels for fat and fiber - switch to low-fat or skim milk Our next appointment is by telephone on 08/27/21 at 2:15 PM  Please call the care guide team at [phone] if you need to cancel or reschedule your appointment.   If you are experiencing a Mental Health or Behavioral Health Crisis or need someone to talk to, please call the Suicide and Crisis Lifeline: 988 call the [place] National Suicide Prevention Lifeline: (409)538-6104 or TTY: 307-721-2250 TTY 587-307-4644) to talk to a trained counselor call 1-800-273-TALK (toll free, 24 hour hotline) go to [facility] [address] [place] [phone]) call 911    Patient verbalizes understanding of instructions and care plan provided today and agrees to view in MyChart. Active MyChart status confirmed with patient.    Dudley Major RN, Maximiano Coss, CDE Care Management Coordinator [facility] [phone], Mobile [phone]

## 2021-06-26 NOTE — Chronic Care Management (AMB) (Signed)
Chronic Care Management   CCM RN Visit Note  06/26/2021 Name: Megan Good MRN: [id number] DOB: 03-23-1951  Subjective: Megan Good is a 71 y.o. year old female who is a primary care patient of Philip Aspen, Limmie Patricia, MD. The care management team was consulted for assistance with disease management and care coordination needs.    Engaged with patient by telephone for follow up visit in response to provider referral for case management and/or care coordination services.   Consent to Services:  The patient was given information about Chronic Care Management services, agreed to services, and gave verbal consent prior to initiation of services.  Please see initial visit note for detailed documentation.   Patient agreed to services and verbal consent obtained.   Assessment: Review of patient past medical history, allergies, medications, health status, including review of consultants reports, laboratory and other test data, was performed as part of comprehensive evaluation and provision of chronic care management services.   SDOH (Social Determinants of Health) assessments and interventions performed:    CCM Care Plan  Allergies  Allergen Reactions   Penicillins Swelling and Rash    Has patient had a PCN reaction causing immediate rash, facial/tongue/throat swelling, SOB or lightheadedness with hypotension: No Has patient had a PCN reaction causing severe rash involving mucus membranes or skin necrosis: No Has patient had a PCN reaction that required hospitalization: No Has patient had a PCN reaction occurring within the last 10 years: No If all of the above answers are "NO", then may proceed with Cephalosporin use.  Burn-like rash    Outpatient Encounter Medications as of 06/26/2021  Medication Sig   Acetaminophen (TYLENOL PO) Take by mouth. AS NEEDED   gabapentin (NEURONTIN) 100 MG capsule Take 2 tabs daily   Multiple Vitamins-Minerals (MULTIVITAMIN ADULTS PO)  Take 1 tablet by mouth daily.   rosuvastatin (CRESTOR) 10 MG tablet Take 1 tablet (10 mg total) by mouth daily.   tobramycin (TOBREX) 0.3 % ophthalmic solution Place 1 drop into the left eye 4 (four) times daily.   [DISCONTINUED] pantoprazole (PROTONIX) 20 MG tablet Take 1 tablet (20 mg total) by mouth daily. (Patient not taking: Reported on 03/30/2020)   No facility-administered encounter medications on file as of 06/26/2021.    Patient Active Problem List   Diagnosis Date Noted   Osteoporosis 01/09/2021   GERD (gastroesophageal reflux disease) 12/07/2019   Hyperlipidemia 12/07/2019   Subclinical hyperthyroidism 12/07/2019   Radicular neuropathy 09/02/2019    Conditions to be addressed/monitored:HLD and Radicular Neuropathy  Care Plan : RN Care Manager Plan of Care  Updates made by Yetta Glassman, RN since 06/26/2021 12:00 AM     Problem: Chronic Disease Management and Care Coordination Needs (radicular neuropathy and HLD)   Priority: High     Long-Range Goal: Establish Plan of Care for Chronic Disease Management Needs (Radicular Neuropathy and HLD)   Start Date: 04/17/2021  Expected End Date: 10/14/2021  Recent Progress: On track  Priority: High  Note:   Current Barriers:  Chronic Disease Management support and education needs related to HLD and Radicular neuropathy No Advanced Directives in place States that her pain level still varies depending if she has over done her activity and she has been pacing herself.  States rest does help with her pain if she has over done it.  States she is walking her dog twice a day daily for about an hour total each day  States she sometimes gets numbness in her feet and  legs that comes and goes.  States she is trying to eat healthy and states she eats the [place].  States she is having her colonoscopy on 07/06/21 RNCM Clinical Goal(s):  Patient will verbalize understanding of plan for management of HLD and Radicular neuropathy as evidenced by  voiced adherence to plan of care verbalize basic understanding of  HLD and Radicular neuropathy disease process and self health management plan as evidenced by voiced understanding and teach back take all medications exactly as prescribed and will call provider for medication related questions as evidenced by dispense report and pt verbalization attend all scheduled medical appointments: colonoscopy 07/06/21, Dr. Ardyth HarpsHernandez 07/11/21 as evidenced by medical records demonstrate Ongoing adherence to prescribed treatment plan for HLD and Radicular neuropathy as evidenced by readings within limits and adherence to plan of care continue to work with RN Care Manager to address care management and care coordination needs related to  HLD and Radicular neuropathy as evidenced by adherence to CM Team Scheduled appointments through collaboration with RN Care manager, provider, and care team.   Interventions: 1:1 collaboration with primary care provider regarding development and update of comprehensive plan of care as evidenced by provider attestation and co-signature Inter-disciplinary care team collaboration (see longitudinal plan of care) Evaluation of current treatment plan related to  self management and patient's adherence to plan as established by provider Health Maintenance Interventions:  (Status:  Goal on track:  Yes.) Long Term Goal Advised patient to discuss  Pneumonia Vaccine COVID vaccination    Hepatitis C test  with primary care provider  Patient provided CDC guideline information about COVID vaccine booster, Pneumonia vaccin   , and Shingles vaccine    Notified provider of adult immunization status and request for consideration of order for/scheduling of Pneumonia Vaccine Hepatitis C test Provided education about reinforced education shingles vaccine and Hepatitis C testing    Hyperlipidemia Interventions:  (Status:  Goal on track:  Yes.) Long Term Goal Medication review performed; medication  list updated in electronic medical record.  Provider established cholesterol goals reviewed Counseled on importance of regular laboratory monitoring as prescribed Reviewed role and benefits of statin for ASCVD risk reduction Reviewed importance of limiting foods high in cholesterol Reviewed exercise goals and target of 150 minutes per week  Pain Interventions:  (Status:  Goal on track:  Yes.) Long Term Goal Pain assessment performed Medications reviewed Reviewed provider established plan for pain management Discussed importance of adherence to all scheduled medical appointments Counseled on the importance of reporting any/all new or changed pain symptoms or management strategies to pain management provider Advised patient to report to care team affect of pain on daily activities Discussed use of relaxation techniques and/or diversional activities to assist with pain reduction (distraction, imagery, relaxation, massage, acupressure, TENS, heat, and cold application Reviewed with patient prescribed pharmacological and nonpharmacological pain relief strategies Reinforced to pace activities    Patient Goals/Self-Care Activities: Take all medications as prescribed Attend all scheduled provider appointments Call pharmacy for medication refills 3-7 days in advance of running out of medications Perform all self care activities independently  Perform IADL's (shopping, preparing meals, housekeeping, managing finances) independently Call provider office for new concerns or questions  call for medicine refill 2 or 3 days before it runs out take all medications exactly as prescribed call doctor with any symptoms you believe are related to your medicine adhere to prescribed diet: low fat heart healthy develop an exercise routine learn how to meditate - learn relaxation techniques - practice  relaxation or meditation daily - think of new ways to do favorite things - use distraction  techniques change to whole grain breads, cereal, pasta - eat smaller or less servings of red meat - fill half the plate with non-starchy vegetables - get blood test (fasting) done 1 week before next visit - increase the amount of fiber in food - read food labels for fat and fiber - switch to low-fat or skim milk Follow Up Plan:  Telephone follow up appointment with care management team member scheduled for:  08/27/21 The patient has been provided with contact information for the care management team and has been advised to call with any health related questions or concerns.       Plan:Telephone follow up appointment with care management team member scheduled for:  08/27/21 The patient has been provided with contact information for the care management team and has been advised to call with any health related questions or concerns.  Dudley Major RN, Maximiano Coss, CDE Care Management Coordinator [facility] [phone], Mobile [phone]

## 2021-07-06 ENCOUNTER — Other Ambulatory Visit: Payer: Self-pay | Admitting: Internal Medicine

## 2021-07-06 ENCOUNTER — Encounter: Payer: Medicare PPO | Admitting: Internal Medicine

## 2021-07-06 DIAGNOSIS — R202 Paresthesia of skin: Secondary | ICD-10-CM

## 2021-07-06 DIAGNOSIS — R200 Anesthesia of skin: Secondary | ICD-10-CM

## 2021-07-06 DIAGNOSIS — E785 Hyperlipidemia, unspecified: Secondary | ICD-10-CM

## 2021-07-10 DIAGNOSIS — E785 Hyperlipidemia, unspecified: Secondary | ICD-10-CM

## 2021-07-11 ENCOUNTER — Ambulatory Visit (INDEPENDENT_AMBULATORY_CARE_PROVIDER_SITE_OTHER): Payer: Medicare PPO | Admitting: Internal Medicine

## 2021-07-11 ENCOUNTER — Encounter: Payer: Self-pay | Admitting: Internal Medicine

## 2021-07-11 VITALS — BP 130/80 | HR 85 | Temp 98.1°F | Wt 125.6 lb

## 2021-07-11 DIAGNOSIS — E0590 Thyrotoxicosis, unspecified without thyrotoxic crisis or storm: Secondary | ICD-10-CM | POA: Diagnosis not present

## 2021-07-11 DIAGNOSIS — H6121 Impacted cerumen, right ear: Secondary | ICD-10-CM | POA: Diagnosis not present

## 2021-07-11 DIAGNOSIS — R202 Paresthesia of skin: Secondary | ICD-10-CM | POA: Diagnosis not present

## 2021-07-11 DIAGNOSIS — E785 Hyperlipidemia, unspecified: Secondary | ICD-10-CM

## 2021-07-11 DIAGNOSIS — R200 Anesthesia of skin: Secondary | ICD-10-CM | POA: Diagnosis not present

## 2021-07-11 LAB — T3, FREE: T3, Free: 2.8 pg/mL (ref 2.3–4.2)

## 2021-07-11 LAB — T4, FREE: Free T4: 1.03 ng/dL (ref 0.60–1.60)

## 2021-07-11 LAB — TSH: TSH: 0.51 u[IU]/mL (ref 0.35–5.50)

## 2021-07-11 MED ORDER — GABAPENTIN 100 MG PO CAPS: 200.0000 mg | ORAL_CAPSULE | Freq: Every day | ORAL | 1 refills | Status: DC

## 2021-07-11 MED ORDER — ROSUVASTATIN CALCIUM 10 MG PO TABS: 10.0000 mg | ORAL_TABLET | Freq: Every day | ORAL | 1 refills | Status: DC

## 2021-07-11 NOTE — Progress Notes (Signed)
Established Patient Office Visit     This visit occurred during the SARS-CoV-2 public health emergency.  Safety protocols were in place, including screening questions prior to the visit, additional usage of staff PPE, and extensive cleaning of exam room while observing appropriate contact time as indicated for disinfecting solutions.    CC/Reason for Visit: Discuss some acute concerns  HPI: Megan Good is a 71 y.o. female who is coming in today for the above mentioned reasons.  She would like to discuss some acute issues:  1.  She needs refills of gabapentin and statin.  2.  She would like her thyroid levels checked.  She has a history of subclinical hyperthyroidism.  3.  She has been having decreased hearing out of her right ear, believes she has excessive earwax.  Past Medical/Surgical History: Past Medical History:  Diagnosis Date   Allergy    SEASONAL   Arthritis    LEFT SHOULDER,HAND-BILATERAL   Cataract    BILATERAL-REMOVED,2014   Headache    Hyperlipidemia    Osteoporosis    Thyroid disease     Past Surgical History:  Procedure Laterality Date   BREAST BIOPSY Left 2003   TONSILLECTOMY     AS A CHILD    Social History:  reports that she has never smoked. She has never used smokeless tobacco. She reports that she does not drink alcohol and does not use drugs.  Allergies: Allergies  Allergen Reactions   Penicillins Swelling and Rash    Has patient had a PCN reaction causing immediate rash, facial/tongue/throat swelling, SOB or lightheadedness with hypotension: No Has patient had a PCN reaction causing severe rash involving mucus membranes or skin necrosis: No Has patient had a PCN reaction that required hospitalization: No Has patient had a PCN reaction occurring within the last 10 years: No If all of the above answers are "NO", then may proceed with Cephalosporin use.  Burn-like rash    Family History:  Family History  Problem Relation  Age of Onset   Healthy Mother 58   Other Father        MVA   Stroke Maternal Grandmother    Colon cancer Neg Hx    Colon polyps Neg Hx    Esophageal cancer Neg Hx    Rectal cancer Neg Hx    Stomach cancer Neg Hx      Current Outpatient Medications:    Acetaminophen (TYLENOL PO), Take by mouth. AS NEEDED, Disp: , Rfl:    Multiple Vitamins-Minerals (MULTIVITAMIN ADULTS PO), Take 1 tablet by mouth daily., Disp: , Rfl:    gabapentin (NEURONTIN) 100 MG capsule, Take 2 capsules (200 mg total) by mouth daily. TAKE TWO CAPSULES BY MOUTH DAILY, Disp: 180 capsule, Rfl: 1   rosuvastatin (CRESTOR) 10 MG tablet, Take 1 tablet (10 mg total) by mouth daily., Disp: 90 tablet, Rfl: 1  Review of Systems:  Constitutional: Denies fever, chills, diaphoresis, appetite change and fatigue.  HEENT: Denies photophobia, eye pain, redness,  congestion, sore throat, rhinorrhea, sneezing, mouth sores, trouble swallowing, neck pain, neck stiffness and tinnitus.   Respiratory: Denies SOB, DOE, cough, chest tightness,  and wheezing.   Cardiovascular: Denies chest pain, palpitations and leg swelling.  Gastrointestinal: Denies nausea, vomiting, abdominal pain, diarrhea, constipation, blood in stool and abdominal distention.  Genitourinary: Denies dysuria, urgency, frequency, hematuria, flank pain and difficulty urinating.  Endocrine: Denies: hot or cold intolerance, sweats, changes in hair or nails, polyuria, polydipsia. Musculoskeletal: Denies  back pain,  joint swelling and gait problem.  Skin: Denies pallor, rash and wound.  Neurological: Denies dizziness, seizures, syncope, weakness, light-headedness, numbness and headaches.  Hematological: Denies adenopathy. Easy bruising, personal or family bleeding history  Psychiatric/Behavioral: Denies suicidal ideation, mood changes, confusion, nervousness, sleep disturbance and agitation    Physical Exam: Vitals:   07/11/21 1108  BP: 130/80  Pulse: 85  Temp: 98.1 F  (36.7 C)  TempSrc: Oral  SpO2: 98%  Weight: 125 lb 9.6 oz (57 kg)    Body mass index is 22.61 kg/m.   Constitutional: NAD, calm, comfortable Eyes: PERRL, lids and conjunctivae normal ENMT: Mucous membranes are moist. Tympanic membrane is pearly white, no erythema or bulging on the left, right is obstructed by cerumen Neck: normal, supple, no masses, no thyromegaly Respiratory: clear to auscultation bilaterally, no wheezing, no crackles. Normal respiratory effort. No accessory muscle use.  Cardiovascular: Regular rate and rhythm, no murmurs / rubs / gallops. No extremity edema. 2+ pedal pulses. No carotid bruits.  Psychiatric: Normal judgment and insight. Alert and oriented x 3. Normal mood.    Impression and Plan:  Subclinical hyperthyroidism  - Plan: TSH, T4, free, T3, free  Hearing loss of right ear due to cerumen impaction -Cerumen Desimpaction  After patient consent was obtained, warm water was applied and gentle ear lavage performed on right ear. There were no complications and following the desimpaction the tympanic membranes were visible. Tympanic membranes are intact following the procedure. Auditory canals are normal. The patient reported relief of symptoms after removal of cerumen.   Numbness and tingling of both feet  - Plan: gabapentin (NEURONTIN) 100 MG capsule  Hyperlipidemia, unspecified hyperlipidemia type  - Plan: rosuvastatin (CRESTOR) 10 MG tablet  Time spent: 22 minutes reviewing chart, interviewing and examining patient and formulating plan of care.     Lelon Frohlich, MD  Primary Care at [facility]

## 2021-07-18 ENCOUNTER — Telehealth: Payer: Self-pay | Admitting: Internal Medicine

## 2021-07-18 ENCOUNTER — Emergency Department (HOSPITAL_COMMUNITY)
Admission: EM | Admit: 2021-07-18 | Discharge: 2021-07-19 | Discharge status: Against Medical Advice | Payer: Medicare PPO | Attending: Emergency Medicine | Admitting: Emergency Medicine

## 2021-07-18 ENCOUNTER — Other Ambulatory Visit: Payer: Self-pay

## 2021-07-18 ENCOUNTER — Emergency Department (HOSPITAL_COMMUNITY): Payer: Medicare PPO

## 2021-07-18 ENCOUNTER — Ambulatory Visit: Payer: Medicare PPO | Admitting: Internal Medicine

## 2021-07-18 DIAGNOSIS — R079 Chest pain, unspecified: Secondary | ICD-10-CM

## 2021-07-18 DIAGNOSIS — R0789 Other chest pain: Secondary | ICD-10-CM | POA: Diagnosis not present

## 2021-07-18 DIAGNOSIS — Z5321 Procedure and treatment not carried out due to patient leaving prior to being seen by health care provider: Secondary | ICD-10-CM | POA: Insufficient documentation

## 2021-07-18 LAB — BASIC METABOLIC PANEL
Anion gap: 9 (ref 5–15)
BUN: 11 mg/dL (ref 8–23)
CO2: 24 mmol/L (ref 22–32)
Calcium: 9.1 mg/dL (ref 8.9–10.3)
Chloride: 105 mmol/L (ref 98–111)
Creatinine, Ser: 0.9 mg/dL (ref 0.44–1.00)
GFR, Estimated: >60 mL/min (ref >60)
Glucose, Bld: 101 mg/dL — ABNORMAL HIGH (ref 70–99)
Potassium: 3.6 mmol/L (ref 3.5–5.1)
Sodium: 138 mmol/L (ref 135–145)

## 2021-07-18 LAB — CBC
HCT: 36.8 % (ref 36.0–46.0)
Hemoglobin: 12.1 g/dL (ref 12.0–15.0)
MCH: 27.9 pg (ref 26.0–34.0)
MCHC: 32.9 g/dL (ref 30.0–36.0)
MCV: 84.8 fL (ref 80.0–100.0)
Platelets: 159 10*3/uL (ref 150–400)
RBC: 4.34 MIL/uL (ref 3.87–5.11)
RDW: 13.1 % (ref 11.5–15.5)
WBC: 6 10*3/uL (ref 4.0–10.5)
nRBC: 0 % (ref 0.0–0.2)

## 2021-07-18 LAB — TROPONIN I (HIGH SENSITIVITY)
Troponin I (High Sensitivity): 4 ng/L (ref <18)
Troponin I (High Sensitivity): 6 ng/L (ref <18)

## 2021-07-18 NOTE — Telephone Encounter (Signed)
Pt was transferred to access nurse for chest discomfort and was told to go to er. Pt decline to go to er and will see dr Ardyth Harps today at 130 pm

## 2021-07-18 NOTE — ED Triage Notes (Signed)
C/O intermittent chest pain, feeling tighness x over a week; worst w/ activity or any change in position. Patient endorsed hx of thyroid problems but stated managed with diet.

## 2021-07-18 NOTE — Telephone Encounter (Signed)
Pt is aware md advise to take triage nurse advise go to er. Pt appt has been cancelled.

## 2021-07-18 NOTE — ED Provider Triage Note (Signed)
Emergency Medicine Provider Triage Evaluation Note  Megan Good , a 71 y.o. female  was evaluated in triage.  Pt complains of left-sided chest, neck pain for the last week.  Patient reports that she attempted to be seen today at her PCP, PCP referred her to the ED for chest pain work-up.  Patient states pain is relieved when she sits up, worsened when she leans forward.  Patient states that she recently was taking care of her neighbors dog who is mildly strained, dog would persistently pull causing patient to have to pick the dog up with left arm.  Review of Systems  Positive: Left-sided chest pain, neck pain Negative: Shortness of breath, nausea, vomiting, abdominal pain, fevers, weakness  Physical Exam  BP (!) 167/93    Pulse 94    Temp 98.6 F (37 C) (Oral)    Resp 17    Ht 5\' 3"  (1.6 m)    Wt 56.7 kg    SpO2 97%    BMI 22.14 kg/m  Gen:   Awake, no distress   Resp:  Normal effort  MSK:   Moves extremities without difficulty  Other:  No lower extremity swelling  Medical Decision Making  Medically screening exam initiated at 2:16 PM.  Appropriate orders placed.  Megan Good was informed that the remainder of the evaluation will be completed by another provider, this initial triage assessment does not replace that evaluation, and the importance of remaining in the ED until their evaluation is complete.     Megan Murphy, PA-C 07/18/21 1417

## 2021-07-19 ENCOUNTER — Telehealth: Payer: Self-pay | Admitting: Internal Medicine

## 2021-07-19 NOTE — Telephone Encounter (Signed)
Patient is aware and an appointment has been made.

## 2021-07-19 NOTE — Telephone Encounter (Signed)
Pt did go to er for chest pain however she did not wait until they gave her her blood work results and would like nurse to go over her results she looked at the results on mychart and does not understand

## 2021-07-19 NOTE — ED Notes (Signed)
No answer for VS x3 

## 2021-07-23 ENCOUNTER — Emergency Department (HOSPITAL_COMMUNITY): Payer: Medicare PPO

## 2021-07-23 ENCOUNTER — Encounter (HOSPITAL_COMMUNITY): Payer: Self-pay | Admitting: *Deleted

## 2021-07-23 ENCOUNTER — Ambulatory Visit (INDEPENDENT_AMBULATORY_CARE_PROVIDER_SITE_OTHER): Payer: Medicare PPO | Admitting: Internal Medicine

## 2021-07-23 ENCOUNTER — Encounter: Payer: Self-pay | Admitting: Internal Medicine

## 2021-07-23 ENCOUNTER — Other Ambulatory Visit: Payer: Self-pay

## 2021-07-23 ENCOUNTER — Emergency Department (HOSPITAL_COMMUNITY)
Admission: EM | Admit: 2021-07-23 | Discharge: 2021-07-24 | Disposition: A | Discharge status: Home / Self Care | Payer: Medicare PPO | Attending: Emergency Medicine | Admitting: Emergency Medicine

## 2021-07-23 VITALS — BP 130/70 | HR 97 | Temp 98.0°F | Wt 125.1 lb

## 2021-07-23 DIAGNOSIS — M25511 Pain in right shoulder: Secondary | ICD-10-CM | POA: Diagnosis not present

## 2021-07-23 DIAGNOSIS — R0781 Pleurodynia: Secondary | ICD-10-CM | POA: Diagnosis not present

## 2021-07-23 DIAGNOSIS — R079 Chest pain, unspecified: Secondary | ICD-10-CM

## 2021-07-23 DIAGNOSIS — R1012 Left upper quadrant pain: Secondary | ICD-10-CM | POA: Insufficient documentation

## 2021-07-23 DIAGNOSIS — I700 Atherosclerosis of aorta: Secondary | ICD-10-CM | POA: Diagnosis not present

## 2021-07-23 DIAGNOSIS — Z09 Encounter for follow-up examination after completed treatment for conditions other than malignant neoplasm: Secondary | ICD-10-CM | POA: Diagnosis not present

## 2021-07-23 DIAGNOSIS — R109 Unspecified abdominal pain: Secondary | ICD-10-CM | POA: Diagnosis not present

## 2021-07-23 LAB — CBC
HCT: 36.9 % (ref 36.0–46.0)
Hemoglobin: 12.4 g/dL (ref 12.0–15.0)
MCH: 28.2 pg (ref 26.0–34.0)
MCHC: 33.6 g/dL (ref 30.0–36.0)
MCV: 83.9 fL (ref 80.0–100.0)
Platelets: 161 10*3/uL (ref 150–400)
RBC: 4.4 MIL/uL (ref 3.87–5.11)
RDW: 12.9 % (ref 11.5–15.5)
WBC: 6.7 10*3/uL (ref 4.0–10.5)
nRBC: 0 % (ref 0.0–0.2)

## 2021-07-23 NOTE — ED Triage Notes (Signed)
Pain under the left breast area and in the left arm. Feels like somebody "punched me in my left arm". Pain worse when bending down or lying down.

## 2021-07-23 NOTE — Progress Notes (Signed)
Established Patient Office Visit     This visit occurred during the SARS-CoV-2 public health emergency.  Safety protocols were in place, including screening questions prior to the visit, additional usage of staff PPE, and extensive cleaning of exam room while observing appropriate contact time as indicated for disinfecting solutions.    CC/Reason for Visit: ED follow-up  HPI: Megan Good is a 71 y.o. female who is coming in today for the above mentioned reasons. Past Medical History is significant for: Hypothyroidism, hyperlipidemia, osteoporosis.  She called the clinic last week with complaints of chest pain and was rerouted to the emergency department.  Work-up was done, however she left without being seen by a provider.  2 sets of troponin were negative, chest x-ray was without acute disease and EKG showed normal sinus rhythm without acute ischemic changes.  She describes chest pain over the left side of her chest and shoulder.  Especially worse with movements of her upper extremity and bending forward.  She is very concerned and would like to see a cardiologist.   Past Medical/Surgical History: Past Medical History:  Diagnosis Date   Allergy    SEASONAL   Arthritis    LEFT SHOULDER,HAND-BILATERAL   Cataract    BILATERAL-REMOVED,2014   Headache    Hyperlipidemia    Osteoporosis    Thyroid disease     Past Surgical History:  Procedure Laterality Date   BREAST BIOPSY Left 2003   TONSILLECTOMY     AS A CHILD    Social History:  reports that she has never smoked. She has never used smokeless tobacco. She reports that she does not drink alcohol and does not use drugs.  Allergies: Allergies  Allergen Reactions   Penicillins Swelling and Rash    Has patient had a PCN reaction causing immediate rash, facial/tongue/throat swelling, SOB or lightheadedness with hypotension: No Has patient had a PCN reaction causing severe rash involving mucus membranes or skin  necrosis: No Has patient had a PCN reaction that required hospitalization: No Has patient had a PCN reaction occurring within the last 10 years: No If all of the above answers are "NO", then may proceed with Cephalosporin use.  Burn-like rash    Family History:  Family History  Problem Relation Age of Onset   Healthy Mother 62   Other Father        MVA   Stroke Maternal Grandmother    Colon cancer Neg Hx    Colon polyps Neg Hx    Esophageal cancer Neg Hx    Rectal cancer Neg Hx    Stomach cancer Neg Hx      Current Outpatient Medications:    Acetaminophen (TYLENOL PO), Take by mouth. AS NEEDED, Disp: , Rfl:    gabapentin (NEURONTIN) 100 MG capsule, Take 2 capsules (200 mg total) by mouth daily. TAKE TWO CAPSULES BY MOUTH DAILY, Disp: 180 capsule, Rfl: 1   Multiple Vitamins-Minerals (MULTIVITAMIN ADULTS PO), Take 1 tablet by mouth daily., Disp: , Rfl:    rosuvastatin (CRESTOR) 10 MG tablet, Take 1 tablet (10 mg total) by mouth daily., Disp: 90 tablet, Rfl: 1  Review of Systems:  Constitutional: Denies fever, chills, diaphoresis, appetite change and fatigue.  HEENT: Denies photophobia, eye pain, redness, hearing loss, ear pain, congestion, sore throat, rhinorrhea, sneezing, mouth sores, trouble swallowing, neck pain, neck stiffness and tinnitus.   Respiratory: Denies SOB, DOE, cough, chest tightness,  and wheezing.   Cardiovascular: Denies  palpitations and leg swelling.  Gastrointestinal: Denies nausea, vomiting, abdominal pain, diarrhea, constipation, blood in stool and abdominal distention.  Genitourinary: Denies dysuria, urgency, frequency, hematuria, flank pain and difficulty urinating.  Endocrine: Denies: hot or cold intolerance, sweats, changes in hair or nails, polyuria, polydipsia. Musculoskeletal: Denies myalgias, back pain, joint swelling, arthralgias and gait problem.  Skin: Denies pallor, rash and wound.  Neurological: Denies dizziness, seizures, syncope, weakness,  light-headedness, numbness and headaches.  Hematological: Denies adenopathy. Easy bruising, personal or family bleeding history  Psychiatric/Behavioral: Denies suicidal ideation, mood changes, confusion, nervousness, sleep disturbance and agitation    Physical Exam: Vitals:   07/23/21 1035  BP: 130/70  Pulse: 97  Temp: 98 F (36.7 C)  TempSrc: Oral  SpO2: 99%  Weight: 125 lb 1.6 oz (56.7 kg)    Body mass index is 22.16 kg/m.   Constitutional: NAD, calm, comfortable Eyes: PERRL, lids and conjunctivae normal ENMT: Mucous membranes are moist.  Respiratory: clear to auscultation bilaterally, no wheezing, no crackles. Normal respiratory effort. No accessory muscle use.  Cardiovascular: Regular rate and rhythm, no murmurs / rubs / gallops. No extremity edema.  Neurologic: Grossly intact and nonfocal Psychiatric: Normal judgment and insight. Alert and oriented x 3. Normal mood.    Impression and Plan:  Hospital discharge follow-up  Right shoulder pain, unspecified chronicity - Plan: Ambulatory referral to Physical Therapy  Chest pain, unspecified type - Plan: Ambulatory referral to Cardiology  -Emergency department chart has been reviewed in detail. -Based on her description of pain, I suspect this is likely musculoskeletal in origin.  She is requesting referral to cardiologist today which I think is acceptable given her age and hyperlipidemia. -I have suggested PT referral which she agrees to.  Time spent: 32 minutes reviewing chart, interviewing and examining patient and formulating plan of care.     Lelon Frohlich, MD [facility]

## 2021-07-24 ENCOUNTER — Emergency Department (HOSPITAL_COMMUNITY): Payer: Medicare PPO

## 2021-07-24 DIAGNOSIS — R109 Unspecified abdominal pain: Secondary | ICD-10-CM | POA: Diagnosis not present

## 2021-07-24 DIAGNOSIS — I700 Atherosclerosis of aorta: Secondary | ICD-10-CM | POA: Diagnosis not present

## 2021-07-24 LAB — BASIC METABOLIC PANEL
Anion gap: 8 (ref 5–15)
BUN: 13 mg/dL (ref 8–23)
CO2: 27 mmol/L (ref 22–32)
Calcium: 9.3 mg/dL (ref 8.9–10.3)
Chloride: 103 mmol/L (ref 98–111)
Creatinine, Ser: 0.87 mg/dL (ref 0.44–1.00)
GFR, Estimated: >60 mL/min (ref >60)
Glucose, Bld: 122 mg/dL — ABNORMAL HIGH (ref 70–99)
Potassium: 3.9 mmol/L (ref 3.5–5.1)
Sodium: 138 mmol/L (ref 135–145)

## 2021-07-24 LAB — HEPATIC FUNCTION PANEL
ALT: 23 U/L (ref 0–44)
AST: 27 U/L (ref 15–41)
Albumin: 4.3 g/dL (ref 3.5–5.0)
Alkaline Phosphatase: 48 U/L (ref 38–126)
Bilirubin, Direct: <0.1 mg/dL (ref 0.0–0.2)
Total Bilirubin: 0.4 mg/dL (ref 0.3–1.2)
Total Protein: 7.2 g/dL (ref 6.5–8.1)

## 2021-07-24 LAB — D-DIMER, QUANTITATIVE: D-Dimer, Quant: 0.32 ug/mL-FEU (ref 0.00–0.50)

## 2021-07-24 LAB — TROPONIN I (HIGH SENSITIVITY)
Troponin I (High Sensitivity): 5 ng/L (ref <18)
Troponin I (High Sensitivity): 7 ng/L (ref <18)

## 2021-07-24 LAB — LIPASE, BLOOD: Lipase: 41 U/L (ref 11–51)

## 2021-07-24 MED ORDER — METHOCARBAMOL 500 MG PO TABS: 500.0000 mg | ORAL_TABLET | Freq: Two times a day (BID) | ORAL | 0 refills | Status: DC

## 2021-07-24 MED ORDER — ONDANSETRON HCL 4 MG/2ML IJ SOLN
4.0000 mg | Freq: Once | INTRAMUSCULAR | Status: AC
  Administered 2021-07-24: 4 mg via INTRAVENOUS
  Filled 2021-07-24: qty 2

## 2021-07-24 MED ORDER — MORPHINE SULFATE (PF) 4 MG/ML IV SOLN
4.0000 mg | Freq: Once | INTRAVENOUS | Status: AC
  Administered 2021-07-24: 4 mg via INTRAVENOUS
  Filled 2021-07-24: qty 1

## 2021-07-24 MED ORDER — PANTOPRAZOLE SODIUM 40 MG PO TBEC: 40.0000 mg | DELAYED_RELEASE_TABLET | Freq: Every day | ORAL | 0 refills | Status: DC

## 2021-07-24 MED ORDER — IOHEXOL 300 MG/ML  SOLN
100.0000 mL | Freq: Once | INTRAMUSCULAR | Status: AC | PRN
  Administered 2021-07-24: 100 mL via INTRAVENOUS

## 2021-07-24 NOTE — ED Provider Notes (Signed)
[facility] EMERGENCY DEPARTMENT Provider Note   CSN: [id number] Arrival date & time: 07/23/21  2322     History  Chief Complaint  Patient presents with   Chest Pain    Megan Good is a 71 y.o. female.  Patient presents to the emergency department for evaluation of left-sided upper abdominal pain.  Patient reports that the pain is up under the ribs and worsens when she lies flat or moves.  No associated shortness of breath.      Home Medications Prior to Admission medications   Medication Sig Start Date End Date Taking? Authorizing Provider  methocarbamol (ROBAXIN) 500 MG tablet Take 1 tablet (500 mg total) by mouth 2 (two) times daily. 07/24/21  Yes Yana Schorr, Canary Brim, MD  pantoprazole (PROTONIX) 40 MG tablet Take 1 tablet (40 mg total) by mouth daily. 07/24/21  Yes Alaria Oconnor, Canary Brim, MD  Acetaminophen (TYLENOL PO) Take by mouth. AS NEEDED    [provider]  gabapentin (NEURONTIN) 100 MG capsule Take 2 capsules (200 mg total) by mouth daily. TAKE TWO CAPSULES BY MOUTH DAILY 07/11/21   Philip Aspen, Limmie Patricia, MD  Multiple Vitamins-Minerals (MULTIVITAMIN ADULTS PO) Take 1 tablet by mouth daily.    [provider]  rosuvastatin (CRESTOR) 10 MG tablet Take 1 tablet (10 mg total) by mouth daily. 07/11/21   Philip Aspen, Limmie Patricia, MD      Allergies    Penicillins    Review of Systems   Review of Systems  Gastrointestinal:  Positive for abdominal pain.   Physical Exam Updated Vital Signs BP (!) 147/74 (BP Location: Right Arm)    Pulse 79    Temp 98.7 F (37.1 C) (Oral)    Resp 18    SpO2 100%  Physical Exam Vitals and nursing note reviewed.  Constitutional:      General: She is not in acute distress.    Appearance: She is well-developed.  HENT:     Head: Normocephalic and atraumatic.     Mouth/Throat:     Mouth: Mucous membranes are moist.  Eyes:     General: Vision grossly intact. Gaze aligned appropriately.      Extraocular Movements: Extraocular movements intact.     Conjunctiva/sclera: Conjunctivae normal.  Cardiovascular:     Rate and Rhythm: Normal rate and regular rhythm.     Pulses: Normal pulses.     Heart sounds: Normal heart sounds, S1 normal and S2 normal. No murmur heard.   No friction rub. No gallop.  Pulmonary:     Effort: Pulmonary effort is normal. No respiratory distress.     Breath sounds: Normal breath sounds.  Abdominal:     General: Bowel sounds are normal.     Palpations: Abdomen is soft.     Tenderness: There is abdominal tenderness in the left upper quadrant. There is no guarding or rebound.     Hernia: No hernia is present.  Musculoskeletal:        General: No swelling.     Cervical back: Full passive range of motion without pain, normal range of motion and neck supple. No spinous process tenderness or muscular tenderness. Normal range of motion.     Right lower leg: No edema.     Left lower leg: No edema.  Skin:    General: Skin is warm and dry.     Capillary Refill: Capillary refill takes less than 2 seconds.     Findings: No ecchymosis, erythema, rash or wound.  Neurological:     General: No focal deficit present.     Mental Status: She is alert and oriented to person, place, and time.     GCS: GCS eye subscore is 4. GCS verbal subscore is 5. GCS motor subscore is 6.     Cranial Nerves: Cranial nerves 2-12 are intact.     Sensory: Sensation is intact.     Motor: Motor function is intact.     Coordination: Coordination is intact.  Psychiatric:        Attention and Perception: Attention normal.        Mood and Affect: Mood normal.        Speech: Speech normal.        Behavior: Behavior normal.    ED Results / Procedures / Treatments   Labs (all labs ordered are listed, but only abnormal results are displayed) Labs Reviewed  BASIC METABOLIC PANEL - Abnormal; Notable for the following components:      Result Value   Glucose, Bld 122 (*)    All other  components within normal limits  CBC  HEPATIC FUNCTION PANEL  LIPASE, BLOOD  D-DIMER, QUANTITATIVE (NOT AT [facility])  TROPONIN I (HIGH SENSITIVITY)  TROPONIN I (HIGH SENSITIVITY)    EKG None ED ECG REPORT   Date: 07/24/2021  Rate: 77  Rhythm: normal sinus rhythm  QRS Axis: normal  Intervals: normal  ST/T Wave abnormalities: nonspecific ST changes and nonspecific ST/T changes  Conduction Disutrbances:none  Narrative Interpretation:   Old EKG Reviewed: unchanged  I have personally reviewed the EKG tracing and agree with the computerized printout as noted.   Radiology DG Chest 2 View  Result Date: 07/23/2021 CLINICAL DATA:  Chest pain EXAM: CHEST - 2 VIEW COMPARISON:  07/18/2021 FINDINGS: The heart size and mediastinal contours are within normal limits. Aortic atherosclerosis. Both lungs are clear. The visualized skeletal structures are unremarkable. IMPRESSION: No active cardiopulmonary disease. Electronically Signed   By: Jasmine Pang M.D.   On: 07/23/2021 23:54   CT ABDOMEN PELVIS W CONTRAST  Result Date: 07/24/2021 CLINICAL DATA:  Abdominal pain, acute, nonlocalized. Pain under left breast and in left arm. EXAM: CT ABDOMEN AND PELVIS WITH CONTRAST TECHNIQUE: Multidetector CT imaging of the abdomen and pelvis was performed using the standard protocol following bolus administration of intravenous contrast. RADIATION DOSE REDUCTION: This exam was performed according to the departmental dose-optimization program which includes automated exposure control, adjustment of the mA and/or kV according to patient size and/or use of iterative reconstruction technique. CONTRAST:  OMNIPAQUE IOHEXOL 300 MG/ML  SOLN COMPARISON:  None. FINDINGS: Lower chest: Dependent atelectasis is noted at the lung bases. Hepatobiliary: A few subcentimeter hypodensities are present in the liver which are too small to further characterize. No biliary ductal dilatation. The gallbladder is without stones. Pancreas:  Unremarkable. No pancreatic ductal dilatation or surrounding inflammatory changes. Spleen: Normal in size without focal abnormality. Adrenals/Urinary Tract: Adrenal glands are unremarkable. No renal calculus or obstructive uropathy. Subcentimeter hypodensities are present in the right kidney which are too small to further characterize. Bladder is unremarkable. Stomach/Bowel: No bowel obstruction, free air, or pneumatosis. The stomach is within normal limits. A moderate amount of retained stool is present in the colon. The appendix is not visualized on exam. Vascular/Lymphatic: Aortic atherosclerosis. No enlarged abdominal or pelvic lymph nodes. Reproductive: Uterus and bilateral adnexa are unremarkable. Other: Small fat containing periumbilical hernia.  No ascites. Musculoskeletal: No acute osseous abnormality. IMPRESSION: 1. No acute intra-abdominal process. 2. Moderate  amount of retained stool in the colon suggesting constipation. Electronically Signed   By: Thornell Sartorius M.D.   On: 07/24/2021 04:40    Procedures Procedures    Medications Ordered in ED Medications  morphine (PF) 4 MG/ML injection 4 mg (4 mg Intravenous Given 07/24/21 0253)  ondansetron (ZOFRAN) injection 4 mg (4 mg Intravenous Given 07/24/21 0253)  iohexol (OMNIPAQUE) 300 MG/ML solution 100 mL (100 mLs Intravenous Contrast Given 07/24/21 1470)    ED Course/ Medical Decision Making/ A&P                           Medical Decision Making Amount and/or Complexity of Data Reviewed Labs: ordered. Decision-making details documented in ED Course. Radiology: ordered and independent interpretation performed. Decision-making details documented in ED Course. ECG/medicine tests: ordered and independent interpretation performed. Decision-making details documented in ED Course.  Risk Prescription drug management.   Presents to the emergency department for evaluation of left-sided abdominal and chest pain.  Differential diagnosis  musculoskeletal chest pain, cardiac chest pain, pneumonia, PE, peptic ulcer disease, GERD, gallbladder disease, pancreatitis, splenic injury  Examination reveals tenderness only in the left upper quadrant, under the rib cage.  No tenderness to palpation over the costal margin.  Lungs are clear.  Patient is not tachycardic, tachypneic or hypoxic.  D-dimer negative, doubt PE.  Cardiac work-up is negative.  This includes nonischemic EKG with negative troponin x2.  Lab work was reassuring.  Chest x-ray does not show any evidence of pulmonary disease.  CT abdomen pelvis also unremarkable.  This is likely musculoskeletal in nature, will treat empirically, follow-up with PCP.        Final Clinical Impression(s) / ED Diagnoses Final diagnoses:  Left upper quadrant abdominal pain    Rx / DC Orders ED Discharge Orders          Ordered    methocarbamol (ROBAXIN) 500 MG tablet  2 times daily        07/24/21 0516    pantoprazole (PROTONIX) 40 MG tablet  Daily        07/24/21 0516              Gilda Crease, MD 07/24/21 [phone]

## 2021-07-25 ENCOUNTER — Encounter: Payer: Self-pay | Admitting: Internal Medicine

## 2021-07-25 ENCOUNTER — Ambulatory Visit (INDEPENDENT_AMBULATORY_CARE_PROVIDER_SITE_OTHER): Payer: Medicare PPO | Admitting: Internal Medicine

## 2021-07-25 VITALS — BP 140/70 | HR 100 | Temp 98.6°F | Wt 124.0 lb

## 2021-07-25 DIAGNOSIS — K219 Gastro-esophageal reflux disease without esophagitis: Secondary | ICD-10-CM

## 2021-07-25 DIAGNOSIS — Z09 Encounter for follow-up examination after completed treatment for conditions other than malignant neoplasm: Secondary | ICD-10-CM

## 2021-07-25 NOTE — Progress Notes (Signed)
Established Patient Office Visit     This visit occurred during the SARS-CoV-2 public health emergency.  Safety protocols were in place, including screening questions prior to the visit, additional usage of staff PPE, and extensive cleaning of exam room while observing appropriate contact time as indicated for disinfecting solutions.    CC/Reason for Visit: Hospital discharge follow-up  HPI: Megan Good is a 71 y.o. female who is coming in today for the above mentioned reasons. Past Medical History is significant for: Hyperlipidemia, osteoporosis, hypothyroidism.  I saw her only 2 days ago after hospital follow-up for chest pain.  She left without being seen that day due to long wait times but the work-up that was done was reviewed and was normal.  When I saw her on the 13th she requested referral to cardiology and that was placed.  She again visited the emergency department on February 13 again with complaints of chest pain.  Work-up was again negative.  She was discharged on pantoprazole.  She has not taken it yet today.  On questioning today she does complain of frequent heartburn and increased belching, no nausea or vomiting.  Pain this time is more of left upper abdomen more so than chest.   Past Medical/Surgical History: Past Medical History:  Diagnosis Date   Allergy    SEASONAL   Arthritis    LEFT SHOULDER,HAND-BILATERAL   Cataract    BILATERAL-REMOVED,2014   Headache    Hyperlipidemia    Osteoporosis    Thyroid disease     Past Surgical History:  Procedure Laterality Date   BREAST BIOPSY Left 2003   TONSILLECTOMY     AS A CHILD    Social History:  reports that she has never smoked. She has never used smokeless tobacco. She reports that she does not drink alcohol and does not use drugs.  Allergies: Allergies  Allergen Reactions   Penicillins Swelling and Rash    Has patient had a PCN reaction causing immediate rash, facial/tongue/throat swelling, SOB  or lightheadedness with hypotension: No Has patient had a PCN reaction causing severe rash involving mucus membranes or skin necrosis: No Has patient had a PCN reaction that required hospitalization: No Has patient had a PCN reaction occurring within the last 10 years: No If all of the above answers are "NO", then may proceed with Cephalosporin use.  Burn-like rash    Family History:  Family History  Problem Relation Age of Onset   Healthy Mother 59   Other Father        MVA   Stroke Maternal Grandmother    Colon cancer Neg Hx    Colon polyps Neg Hx    Esophageal cancer Neg Hx    Rectal cancer Neg Hx    Stomach cancer Neg Hx      Current Outpatient Medications:    Acetaminophen (TYLENOL PO), Take by mouth. AS NEEDED, Disp: , Rfl:    gabapentin (NEURONTIN) 100 MG capsule, Take 2 capsules (200 mg total) by mouth daily. TAKE TWO CAPSULES BY MOUTH DAILY, Disp: 180 capsule, Rfl: 1   methocarbamol (ROBAXIN) 500 MG tablet, Take 1 tablet (500 mg total) by mouth 2 (two) times daily., Disp: 20 tablet, Rfl: 0   Multiple Vitamins-Minerals (MULTIVITAMIN ADULTS PO), Take 1 tablet by mouth daily., Disp: , Rfl:    pantoprazole (PROTONIX) 40 MG tablet, Take 1 tablet (40 mg total) by mouth daily., Disp: 30 tablet, Rfl: 0   rosuvastatin (CRESTOR) 10 MG tablet, Take  1 tablet (10 mg total) by mouth daily., Disp: 90 tablet, Rfl: 1  Review of Systems:  Constitutional: Denies fever, chills, diaphoresis, appetite change and fatigue.  HEENT: Denies photophobia, eye pain, redness, hearing loss, ear pain, congestion, sore throat, rhinorrhea, sneezing, mouth sores, trouble swallowing, neck pain, neck stiffness and tinnitus.   Respiratory: Denies SOB, DOE, cough,  and wheezing.   Cardiovascular: Denies chest pain, palpitations and leg swelling.  Gastrointestinal: Denies nausea, vomiting, abdominal pain, diarrhea, constipation, blood in stool and abdominal distention.  Genitourinary: Denies dysuria, urgency,  frequency, hematuria, flank pain and difficulty urinating.  Endocrine: Denies: hot or cold intolerance, sweats, changes in hair or nails, polyuria, polydipsia. Musculoskeletal: Denies myalgias, back pain, joint swelling, arthralgias and gait problem.  Skin: Denies pallor, rash and wound.  Neurological: Denies dizziness, seizures, syncope, weakness, light-headedness, numbness and headaches.  Hematological: Denies adenopathy. Easy bruising, personal or family bleeding history  Psychiatric/Behavioral: Denies suicidal ideation, mood changes, confusion, nervousness, sleep disturbance and agitation    Physical Exam: Vitals:   07/25/21 0838  BP: 140/70  Pulse: 100  Temp: 98.6 F (37 C)  TempSrc: Oral  SpO2: 99%  Weight: 124 lb (56.2 kg)    Body mass index is 21.97 kg/m.   Constitutional: NAD, calm, comfortable Eyes: PERRL, lids and conjunctivae normal ENMT: Mucous membranes are moist.  Respiratory: clear to auscultation bilaterally, no wheezing, no crackles. Normal respiratory effort. No accessory muscle use.  Cardiovascular: Regular rate and rhythm, no murmurs / rubs / gallops. No extremity edema.  Psychiatric: Normal judgment and insight. Alert and oriented x 3. Normal mood.    Impression and Plan:  Hospital discharge follow-up  Gastroesophageal reflux disease without esophagitis  -Hospital charts have again been reviewed in detail.  Work-up for chest pain was negative.  I agree with prescription for PPI given her dyspepsia symptoms and I suspect this is what her chest/abdominal pain is related to.  She does have minimal risk factors for CAD so at her request I did agree to a referral to cardiology mainly for her peace of mind.  She has not been called yet for appointment.  Time spent: 22 minutes reviewing chart, interviewing and examining patient and formulating plan of care.    Lelon Frohlich, MD [facility]

## 2021-07-30 ENCOUNTER — Ambulatory Visit: Payer: Medicare PPO | Admitting: Internal Medicine

## 2021-08-08 ENCOUNTER — Ambulatory Visit: Payer: Medicare PPO | Admitting: Cardiology

## 2021-08-08 ENCOUNTER — Other Ambulatory Visit: Payer: Self-pay

## 2021-08-08 ENCOUNTER — Encounter: Payer: Self-pay | Admitting: Cardiology

## 2021-08-08 VITALS — BP 140/70 | HR 87 | Ht 63.0 in | Wt 117.2 lb

## 2021-08-08 DIAGNOSIS — R030 Elevated blood-pressure reading, without diagnosis of hypertension: Secondary | ICD-10-CM | POA: Diagnosis not present

## 2021-08-08 DIAGNOSIS — E782 Mixed hyperlipidemia: Secondary | ICD-10-CM

## 2021-08-08 DIAGNOSIS — R079 Chest pain, unspecified: Secondary | ICD-10-CM | POA: Diagnosis not present

## 2021-08-08 DIAGNOSIS — Z79899 Other long term (current) drug therapy: Secondary | ICD-10-CM

## 2021-08-08 DIAGNOSIS — R011 Cardiac murmur, unspecified: Secondary | ICD-10-CM | POA: Diagnosis not present

## 2021-08-08 MED ORDER — METOPROLOL TARTRATE 100 MG PO TABS: ORAL_TABLET | ORAL | 0 refills | Status: DC

## 2021-08-08 NOTE — Progress Notes (Signed)
Cardiology Office Note:    Date:  08/08/2021   ID:  Megan Good, DOB 09-08-1950, MRN [id number]  PCP:  Philip AspenHernandez Acosta, Limmie PatriciaEstela Y, MD  Cardiologist:  Thomasene RippleKardie Celenia Hruska, DO  Electrophysiologist:  None   Referring MD: Philip AspenHernandez Acosta, Estel*   " I am experiencing chest pain"  History of Present Illness:    Megan Good is a 71 y.o. female with a hx of hyperlipidemia, hypothyroidism and osteoporosis presents to be evaluated for chest pain.  The patient tells me over the last month she has had intermittent chest discomfort.  She describes it as sided squeezing sensation when she is mostly on exertion.  She noticed it the first time when she was walking her dog.  She tells me does not last long and then resolve itself.  When the pain first started she went to the emergency department at that time she had blood work done troponins were reported to be negative.  This was February 8.  Then on February 13 she again visited the ED with normal troponin.  Her PCP advised her that she needed to see cardiology to get evaluated.  She denies any shortness of breath.  No chest pain office this morning.  Past Medical History:  Diagnosis Date   Allergy    SEASONAL   Arthritis    LEFT SHOULDER,HAND-BILATERAL   Cataract    BILATERAL-REMOVED,2014   Headache    Hyperlipidemia    Osteoporosis    Thyroid disease     Past Surgical History:  Procedure Laterality Date   BREAST BIOPSY Left 2003   TONSILLECTOMY     AS A CHILD    Current Medications: Current Meds  Medication Sig   Acetaminophen (TYLENOL PO) Take by mouth. AS NEEDED   metoprolol tartrate (LOPRESSOR) 100 MG tablet Take 2 hours prior to CT   Multiple Vitamins-Minerals (MULTIVITAMIN ADULTS PO) Take 1 tablet by mouth daily.   rosuvastatin (CRESTOR) 10 MG tablet Take 1 tablet (10 mg total) by mouth daily.     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 She lives alone in a 2 story house. She occasionally drinks coffee on the weekends. She walks 2-4 miles per day weather permitting. Recently she has been unable to walk dues to headaches.   Social Determinants of Health   Financial Resource Strain: Low Risk    Difficulty of Paying Living Expenses: Not hard at all  Food Insecurity: No Food Insecurity   Worried About Programme researcher, broadcasting/film/videounning Out of Food in the Last Year: Never true   Ran Out of Food in the Last Year: Never true  Transportation Needs: No Transportation Needs   Lack of Transportation (Medical): No   Lack of Transportation (Non-Medical): No  Physical Activity: Sufficiently Active   Days of Exercise  per Week: 7 days   Minutes of Exercise per Session: 150+ min  Stress: No Stress Concern Present   Feeling of Stress : Only a little  Social Connections: Moderately Isolated   Frequency of Communication with Friends and Family: More than three times a week   Frequency of Social Gatherings with Friends and Family: Once a week   Attends Religious Services: 1 to 4 times per year   Active Member of [employer] or Organizations: No   Attends Banker Meetings: Never   Marital Status: Divorced     Family History: The patient's family history includes Healthy (age of onset: 18) in her mother; Other in her father; Stroke in her maternal grandmother. There is no history of Colon cancer, Colon polyps, Esophageal cancer, Rectal  cancer, or Stomach cancer.  ROS:   Review of Systems  Constitution: Negative for decreased appetite, fever and weight gain.  HENT: Negative for congestion, ear discharge, hoarse voice and sore throat.   Eyes: Negative for discharge, redness, vision loss in right eye and visual halos.  Cardiovascular: Negative for chest pain, dyspnea on exertion, leg swelling, orthopnea and palpitations.  Respiratory: Negative for cough, hemoptysis, shortness of breath and snoring.   Endocrine: Negative for heat intolerance and polyphagia.  Hematologic/Lymphatic: Negative for bleeding problem. Does not bruise/bleed easily.  Skin: Negative for flushing, nail changes, rash and suspicious lesions.  Musculoskeletal: Negative for arthritis, joint pain, muscle cramps, myalgias, neck pain and stiffness.  Gastrointestinal: Negative for abdominal pain, bowel incontinence, diarrhea and excessive appetite.  Genitourinary: Negative for decreased libido, genital sores and incomplete emptying.  Neurological: Negative for brief paralysis, focal weakness, headaches and loss of balance.  Psychiatric/Behavioral: Negative for altered mental status, depression and suicidal ideas.  Allergic/Immunologic: Negative for HIV exposure and persistent infections.    EKGs/Labs/Other Studies Reviewed:    The following studies were reviewed today:   EKG:  The ekg ordered today demonstrates sinus rhythm, heart rate 87 bpm with P wave morphology suggesting left atrial enlargement.  Recent Labs: 07/11/2021: TSH 0.51 07/23/2021: BUN 13; Creatinine, Ser 0.87; Hemoglobin 12.4; Platelets 161; Potassium 3.9; Sodium 138 07/24/2021: ALT 23  Recent Lipid Panel    Component Value Date/Time   CHOL 161 10/10/2020 0754   TRIG 66.0 10/10/2020 0754   HDL 54.90 10/10/2020 0754   CHOLHDL 3 10/10/2020 0754   VLDL 13.2 10/10/2020 0754   LDLCALC 93 10/10/2020 0754   LDLCALC 161 (H) 03/09/2020 0754    Physical Exam:    VS:  BP 140/70 (BP Location:  Left Arm, Patient Position: Sitting)    Pulse 87    Ht 5\' 3"  (1.6 m)    Wt 117 lb 3.2 oz (53.2 kg)    SpO2 99%    BMI 20.76 kg/m     Wt Readings from Last 3 Encounters:  08/08/21 117 lb 3.2 oz (53.2 kg)  07/25/21 124 lb (56.2 kg)  07/23/21 125 lb 1.6 oz (56.7 kg)     GEN: Well nourished, well developed in no acute distress HEENT: Normal NECK: No JVD; No carotid bruits LYMPHATICS: No lymphadenopathy CARDIAC: S1S2 noted,RRR, 3/6 midsystolic murmurs, rubs, gallops RESPIRATORY:  Clear to auscultation without rales, wheezing or rhonchi  ABDOMEN: Soft, non-tender, non-distended, +bowel sounds, no guarding. EXTREMITIES: No edema, No cyanosis, no clubbing MUSCULOSKELETAL:  No deformity  SKIN: Warm and dry NEUROLOGIC:  Alert and oriented x 3, non-focal PSYCHIATRIC:  Normal affect, good insight  ASSESSMENT:    1. Mixed hyperlipidemia   2.  Chest pain, unspecified type   3. Blood pressure elevated without history of HTN   4. Cardiac murmur   5. Medication management    PLAN:     The symptoms chest pain is concerning, this patient does have intermediate risk for coronary artery disease and at this time I would like to pursue an ischemic evaluation in this patient.  Shared decision a coronary CTA at this time is appropriate.  I have discussed with the patient about the testing.  The patient has no IV contrast allergy and is agreeable to proceed with this test.  Physical exam suggesting aortic stenosis or sclerosis murmur we will get an echocardiogram.  Her blood pressure is elevated in the office today.  This appears to be an isolated reading no history of hypertension.  I have educated patient about this.  She will take her blood pressure daily.  The patient is in agreement with the above plan. The patient left the office in stable condition.  The patient will follow up in 4 months or sooner if needed.   Medication Adjustments/Labs and Tests Ordered: Current medicines are reviewed at  length with the patient today.  Concerns regarding medicines are outlined above.  Orders Placed This Encounter  Procedures   CT CORONARY MORPH W/CTA COR W/SCORE W/CA W/CM &/OR WO/CM   Basic Metabolic Panel (BMET)   Magnesium   EKG 12-Lead   ECHOCARDIOGRAM COMPLETE   Meds ordered this encounter  Medications   metoprolol tartrate (LOPRESSOR) 100 MG tablet    Sig: Take 2 hours prior to CT    Dispense:  1 tablet    Refill:  0    Patient Instructions  Medication Instructions:  Your physician recommends that you continue on your current medications as directed. Please refer to the Current Medication list given to you today.  *If you need a refill on your cardiac medications before your next appointment, please call your pharmacy*   Lab Work: Your physician recommends that you return for lab work in:  TODAY: BMET, Mag If you have labs (blood work) drawn today and your tests are completely normal, you will receive your results only by: MyChart Message (if you have MyChart) OR A paper copy in the mail If you have any lab test that is abnormal or we need to change your treatment, we will call you to review the results.   Testing/Procedures: Your physician has requested that you have an echocardiogram. Echocardiography is a painless test that uses sound waves to create images of your heart. It provides your doctor with information about the size and shape of your heart and how well your hearts chambers and valves are working. This procedure takes approximately one hour. There are no restrictions for this procedure.    Your cardiac CT will be scheduled at one of the below locations:   [facility] [address] [place], [place] [address] [phone]   If scheduled at [facility], please arrive at the [facility] and Children's Entrance (Entrance C2) of [facility] 30 minutes prior to test start time. You can use the FREE valet parking offered at entrance  C (encouraged to control the heart rate for the test)  Proceed to the [facility] Radiology Department (first floor) to check-in and test prep.  All radiology patients and guests should use entrance C2 at [facility], accessed from [facility], even though the hospital's physical address listed is [address].  Please follow these instructions carefully (unless otherwise directed):   On the Night Before the Test: Be sure to Drink plenty of water. Do not consume any caffeinated/decaffeinated beverages or chocolate 12 hours prior to your test. Do not take any antihistamines 12 hours prior to your test.  On the Day of the Test: Drink plenty of water until 1 hour prior to the test. Do not eat any food 4 hours prior to the test. You may take your regular medications prior to the test.  Take metoprolol (Lopressor) two hours prior to test. FEMALES- please wear underwire-free bra if available, avoid dresses & tight clothing       After the Test: Drink plenty of water. After receiving IV contrast, you may experience a mild flushed feeling. This is normal. On occasion, you may experience a mild rash up to 24 hours after the test. This is not dangerous. If this occurs, you can take Benadryl 25 mg and increase your fluid intake. If you experience trouble breathing, this can be serious. If it is severe call 911 IMMEDIATELY. If it is mild, please call our office. If you take any of these medications: Glipizide/Metformin, Avandament, Glucavance, please do not take 48 hours after completing test unless otherwise instructed.  We will call to schedule your test 2-4 weeks out understanding that some insurance companies will need an authorization prior to the service being performed.   For non-scheduling related questions, please contact the cardiac imaging nurse navigator should you have any questions/concerns: Rockwell AlexandriaSara Wallace, Cardiac Imaging Nurse Navigator Larey BrickMerle  [place], Cardiac Imaging Nurse Navigator [facility] Direct Office Dial: [phone]   For scheduling needs, including cancellations and rescheduling, please call [place], [phone].    Follow-Up: At [facility], you and your health needs are our priority.  As part of our continuing mission to provide you with exceptional heart care, we have created designated Provider Care Teams.  These Care Teams include your primary Cardiologist (physician) and Advanced Practice Providers (APPs -  Physician Assistants and Nurse Practitioners) who all work together to provide you with the care you need, when you need it.  We recommend signing up for the patient portal called "MyChart".  Sign up information is provided on this After Visit Summary.  MyChart is used to connect with patients for Virtual Visits (Telemedicine).  Patients are able to view lab/test results, encounter notes, upcoming appointments, etc.  Non-urgent messages can be sent to your provider as well.   To learn more about what you can do with MyChart, go to [email or web address].    Your next appointment:   4 month(s)  The format for your next appointment:   In Person  Provider:   Thomasene RippleKardie Zaria Taha, DO     Other Instructions     Adopting a Healthy Lifestyle.  Know what a healthy weight is for you (roughly BMI <25) and aim to maintain this   Aim for 7+ servings of fruits and vegetables daily   65-80+ fluid ounces of water or unsweet tea for healthy kidneys   Limit to max 1 drink of alcohol per day; avoid smoking/tobacco   Limit animal fats in diet for cholesterol and heart health - choose grass fed whenever available   Avoid highly processed foods, and foods high in saturated/trans fats   Aim for low stress - take time to unwind and care for your mental health   Aim for 150 min of moderate intensity exercise weekly for heart health, and weights twice  weekly for bone health   Aim for 7-9  hours of sleep daily   When it comes to diets, agreement about the perfect plan isnt easy to find, even among the experts. Experts at the [facility] [employer] developed an idea known as the Healthy Eating Plate. Just imagine a plate divided into logical, healthy portions.   The emphasis is on diet quality:   Load up on vegetables and fruits - one-half of your plate: Aim for color and variety, and remember that potatoes dont count.   Go for whole grains - one-quarter of your plate: Whole wheat, barley, wheat berries, quinoa, oats, brown rice, and foods made with them. If you want pasta, go with whole wheat pasta.   Protein power - one-quarter of your plate: Fish, chicken, beans, and nuts are all healthy, versatile protein sources. Limit red meat.   The diet, however, does go beyond the plate, offering a few other suggestions.   Use healthy plant oils, such as olive, canola, soy, corn, sunflower and peanut. Check the labels, and avoid partially hydrogenated oil, which have unhealthy trans fats.   If youre thirsty, drink water. Coffee and tea are good in moderation, but skip sugary drinks and limit milk and dairy products to one or two daily servings.   The type of carbohydrate in the diet is more important than the amount. Some sources of carbohydrates, such as vegetables, fruits, whole grains, and beans-are healthier than others.   Finally, stay active  Signed, Thomasene Ripple, DO  08/08/2021 9:46 AM    [facility]

## 2021-08-08 NOTE — Patient Instructions (Addendum)
Medication Instructions:  ?Your physician recommends that you continue on your current medications as directed. Please refer to the Current Medication list given to you today.  ?*If you need a refill on your cardiac medications before your next appointment, please call your pharmacy* ? ? ?Lab Work: ?Your physician recommends that you return for lab work in:  ?TODAY: BMET, Mag ?If you have labs (blood work) drawn today and your tests are completely normal, you will receive your results only by: ?MyChart Message (if you have MyChart) OR ?A paper copy in the mail ?If you have any lab test that is abnormal or we need to change your treatment, we will call you to review the results. ? ? ?Testing/Procedures: ?Your physician has requested that you have an echocardiogram. Echocardiography is a painless test that uses sound waves to create images of your heart. It provides your doctor with information about the size and shape of your heart and how well your heart?s chambers and valves are working. This procedure takes approximately one hour. There are no restrictions for this procedure. ? ? ? ?Your cardiac CT will be scheduled at one of the below locations:  ? ?[facility] ?[address] ?[place], [place] [address] ?[phone] ? ? ?If scheduled at [facility], please arrive at the [facility] and Children's Entrance (Entrance C2) of [facility] 30 minutes prior to test start time. ?You can use the FREE valet parking offered at entrance C (encouraged to control the heart rate for the test)  ?Proceed to the [facility] Radiology Department (first floor) to check-in and test prep. ? ?All radiology patients and guests should use entrance C2 at [facility], accessed from [facility], even though the hospital's physical address listed is [address]. ? ? ? ? ?Please follow these instructions carefully (unless otherwise directed): ? ? ?On the Night Before the Test: ?Be sure  to Drink plenty of water. ?Do not consume any caffeinated/decaffeinated beverages or chocolate 12 hours prior to your test. ?Do not take any antihistamines 12 hours prior to your test. ? ?On the Day of the Test: ?Drink plenty of water until 1 hour prior to the test. ?Do not eat any food 4 hours prior to the test. ?You may take your regular medications prior to the test.  ?Take metoprolol (Lopressor) two hours prior to test. ?FEMALES- please wear underwire-free bra if available, avoid dresses & tight clothing  ?     ?After the Test: ?Drink plenty of water. ?After receiving IV contrast, you may experience a mild flushed feeling. This is normal. ?On occasion, you may experience a mild rash up to 24 hours after the test. This is not dangerous. If this occurs, you can take Benadryl 25 mg and increase your fluid intake. ?If you experience trouble breathing, this can be serious. If it is severe call 911 IMMEDIATELY. If it is mild, please call our office. ?If you take any of these medications: Glipizide/Metformin, Avandament, Glucavance, please do not take 48 hours after completing test unless otherwise instructed. ? ?We will call to schedule your test 2-4 weeks out understanding that some insurance companies will need an authorization prior to the service being performed.  ? ?For non-scheduling related questions, please contact the cardiac imaging nurse navigator should you have any questions/concerns: ?Rockwell Alexandria, Cardiac Imaging Nurse Navigator ?Larey Brick, Cardiac Imaging Nurse Navigator ?[facility] ?Direct Office Dial: [phone]  ? ?For scheduling needs, including cancellations  and rescheduling, please call [place], [phone]. ? ? ? ?Follow-Up: ?At [facility], you and your health needs are our priority.  As part of our continuing mission to provide you with exceptional heart care, we have created designated Provider Care Teams.  These Care Teams include your primary  Cardiologist (physician) and Advanced Practice Providers (APPs -  Physician Assistants and Nurse Practitioners) who all work together to provide you with the care you need, when you need it. ? ?We recommend signing up for the patient portal called "MyChart".  Sign up information is provided on this After Visit Summary.  MyChart is used to connect with patients for Virtual Visits (Telemedicine).  Patients are able to view lab/test results, encounter notes, upcoming appointments, etc.  Non-urgent messages can be sent to your provider as well.   ?To learn more about what you can do with MyChart, go to [email or web address].   ? ?Your next appointment:   ?4 month(s) ? ?The format for your next appointment:   ?In Person ? ?Provider:   ?Thomasene Ripple, DO   ? ? ?Other Instructions ?  ?

## 2021-08-10 ENCOUNTER — Other Ambulatory Visit: Payer: Self-pay

## 2021-08-10 ENCOUNTER — Ambulatory Visit (INDEPENDENT_AMBULATORY_CARE_PROVIDER_SITE_OTHER): Payer: Medicare PPO

## 2021-08-10 DIAGNOSIS — R011 Cardiac murmur, unspecified: Secondary | ICD-10-CM | POA: Diagnosis not present

## 2021-08-10 LAB — ECHOCARDIOGRAM COMPLETE
AR max vel: 1.88 cm2
AV Area VTI: 1.58 cm2
AV Area mean vel: 1.57 cm2
AV Mean grad: 7 mmHg
AV Peak grad: 12.5 mmHg
Ao pk vel: 1.77 m/s
Area-P 1/2: 3.19 cm2
S' Lateral: 2.2 cm

## 2021-08-13 ENCOUNTER — Encounter: Payer: Self-pay | Admitting: Internal Medicine

## 2021-08-13 ENCOUNTER — Ambulatory Visit (INDEPENDENT_AMBULATORY_CARE_PROVIDER_SITE_OTHER): Payer: Medicare PPO | Admitting: Internal Medicine

## 2021-08-13 VITALS — BP 102/68 | HR 91 | Temp 97.4°F | Wt 118.5 lb

## 2021-08-13 DIAGNOSIS — K219 Gastro-esophageal reflux disease without esophagitis: Secondary | ICD-10-CM

## 2021-08-13 MED ORDER — PANTOPRAZOLE SODIUM 40 MG PO TBEC: 40.0000 mg | DELAYED_RELEASE_TABLET | Freq: Two times a day (BID) | ORAL | 2 refills | Status: DC

## 2021-08-13 NOTE — Progress Notes (Signed)
? ? ? ?Established Patient Office Visit ? ? ? ? ?This visit occurred during the SARS-CoV-2 public health emergency.  Safety protocols were in place, including screening questions prior to the visit, additional usage of staff PPE, and extensive cleaning of exam room while observing appropriate contact time as indicated for disinfecting solutions.  ? ? ?CC/Reason for Visit: Trouble sleeping, continued chest discomfort ? ?HPI: Megan Good is a 71 y.o. female who is coming in today for the above mentioned reasons.  She has had several visits to the ED for chest discomfort.  She requested follow-up with cardiology.  She was seen by them on 3/1.  2D echo was performed that showed a hyperdynamic left ventricle with an ejection fraction of 70 to 75% with no wall motion abnormalities and grade 1 diastolic dysfunction.  There was some aortic sclerosis but no aortic valve stenosis.  She has a cardiac CT scheduled for next week.  She continues to complain of chest pain that is making it difficult for her to sleep at night.  She describes the chest pain as a burning in her chest, also gets worse with eating or when she has not eaten in a long time, increased belching.  She is wondering if she can take her pantoprazole twice daily instead of daily. ? ?Past Medical/Surgical History: ?Past Medical History:  ?Diagnosis Date  ? Allergy   ? SEASONAL  ? Arthritis   ? LEFT SHOULDER,HAND-BILATERAL  ? Cataract   ? BILATERAL-REMOVED,2014  ? Headache   ? Hyperlipidemia   ? Osteoporosis   ? Thyroid disease   ? ? ?Past Surgical History:  ?Procedure Laterality Date  ? BREAST BIOPSY Left 2003  ? TONSILLECTOMY    ? AS A CHILD  ? ? ?Social History: ? reports that she has never smoked. She has never used smokeless tobacco. She reports that she does not drink alcohol and does not use drugs. ? ?Allergies: ?Allergies  ?Allergen Reactions  ? Penicillins Swelling and Rash  ?  Has patient had a PCN reaction causing immediate rash,  facial/tongue/throat swelling, SOB or lightheadedness with hypotension: No ?Has patient had a PCN reaction causing severe rash involving mucus membranes or skin necrosis: No ?Has patient had a PCN reaction that required hospitalization: No ?Has patient had a PCN reaction occurring within the last 10 years: No ?If all of the above answers are "NO", then may proceed with Cephalosporin use. ? ?Burn-like rash  ? ? ?Family History:  ?Family History  ?Problem Relation Age of Onset  ? Healthy Mother 59  ? Other Father   ?     MVA  ? Stroke Maternal Grandmother   ? Colon cancer Neg Hx   ? Colon polyps Neg Hx   ? Esophageal cancer Neg Hx   ? Rectal cancer Neg Hx   ? Stomach cancer Neg Hx   ? ? ? ?Current Outpatient Medications:  ?  Acetaminophen (TYLENOL PO), Take by mouth. AS NEEDED, Disp: , Rfl:  ?  metoprolol tartrate (LOPRESSOR) 100 MG tablet, Take 2 hours prior to CT, Disp: 1 tablet, Rfl: 0 ?  Multiple Vitamins-Minerals (MULTIVITAMIN ADULTS PO), Take 1 tablet by mouth daily., Disp: , Rfl:  ?  rosuvastatin (CRESTOR) 10 MG tablet, Take 1 tablet (10 mg total) by mouth daily., Disp: 90 tablet, Rfl: 1 ?  pantoprazole (PROTONIX) 40 MG tablet, Take 1 tablet (40 mg total) by mouth 2 (two) times daily., Disp: 60 tablet, Rfl: 2 ? ?Review of Systems:  ?Constitutional:  Denies fever, chills, diaphoresis, appetite change and fatigue.  ?HEENT: Denies photophobia, eye pain, redness, hearing loss, ear pain, congestion, sore throat, rhinorrhea, sneezing, mouth sores, trouble swallowing, neck pain, neck stiffness and tinnitus.   ?Respiratory: Denies SOB, DOE, cough, chest tightness,  and wheezing.   ?Cardiovascular: Denies, palpitations and leg swelling.  ?Gastrointestinal: Denies nausea, vomiting, abdominal pain, diarrhea, constipation, blood in stool and abdominal distention.  ?Genitourinary: Denies dysuria, urgency, frequency, hematuria, flank pain and difficulty urinating.  ?Endocrine: Denies: hot or cold intolerance, sweats, changes  in hair or nails, polyuria, polydipsia. ?Musculoskeletal: Denies myalgias, back pain, joint swelling, arthralgias and gait problem.  ?Skin: Denies pallor, rash and wound.  ?Neurological: Denies dizziness, seizures, syncope, weakness, light-headedness, numbness and headaches.  ?Hematological: Denies adenopathy. Easy bruising, personal or family bleeding history  ?Psychiatric/Behavioral: Denies suicidal ideation, mood changes, confusion, nervousness, sleep disturbance and agitation ? ? ? ?Physical Exam: ?Vitals:  ? 08/13/21 1319  ?BP: 102/68  ?Pulse: 91  ?Temp: (!) 97.4 ?F (36.3 ?C)  ?TempSrc: Oral  ?SpO2: 99%  ?Weight: 118 lb 8 oz (53.8 kg)  ? ? ?Body mass index is 20.99 kg/m?. ? ? ?Constitutional: NAD, calm, comfortable ?Eyes: PERRL, lids and conjunctivae normal ?ENMT: Mucous membranes are moist.  ?Respiratory: clear to auscultation bilaterally, no wheezing, no crackles. Normal respiratory effort. No accessory muscle use.  ?Cardiovascular: Regular rate and rhythm, no murmurs / rubs / gallops. No extremity edema.  ?Psychiatric: Normal judgment and insight. Alert and oriented x 3. Normal mood.  ? ? ?Impression and Plan: ? ?Gastroesophageal reflux disease, unspecified whether esophagitis present ? - Plan: pantoprazole (PROTONIX) 40 MG tablet ?-Charts have again been reviewed.  2D echo results as noted above.  She has a cardiac CT scheduled for next week. ?-Because of her description of chest pain I do believe that this is likely GERD related.  It is reasonable for her to increase her pantoprazole to twice daily, will await results of cardiac CT but will strongly consider GI referral for potential EGD if symptoms not improved with twice daily PPI therapy. ? ?Time spent: 34 minutes reviewing chart, interviewing and examining patient and formulating plan of care. ? ? ? ? ?Chaya Jan, MD ?[facility] ? ? ?

## 2021-08-15 LAB — BASIC METABOLIC PANEL
BUN/Creatinine Ratio: 12 (ref 12–28)
BUN: 11 mg/dL (ref 8–27)
CO2: 22 mmol/L (ref 20–29)
Calcium: 9.4 mg/dL (ref 8.7–10.3)
Chloride: 103 mmol/L (ref 96–106)
Creatinine, Ser: 0.95 mg/dL (ref 0.57–1.00)
Glucose: 101 mg/dL — ABNORMAL HIGH (ref 70–99)
Potassium: 3.9 mmol/L (ref 3.5–5.2)
Sodium: 143 mmol/L (ref 134–144)
eGFR: 64 mL/min/{1.73_m2} (ref >59)

## 2021-08-15 LAB — MAGNESIUM: Magnesium: 2 mg/dL (ref 1.6–2.3)

## 2021-08-16 ENCOUNTER — Ambulatory Visit: Payer: Medicare PPO | Admitting: Physical Therapy

## 2021-08-22 ENCOUNTER — Telehealth (HOSPITAL_COMMUNITY): Payer: Self-pay | Admitting: Emergency Medicine

## 2021-08-22 DIAGNOSIS — R079 Chest pain, unspecified: Secondary | ICD-10-CM

## 2021-08-22 MED ORDER — IVABRADINE HCL 5 MG PO TABS: 15.0000 mg | ORAL_TABLET | Freq: Once | ORAL | 0 refills | Status: AC

## 2021-08-22 NOTE — Telephone Encounter (Signed)
Reaching out to patient to offer assistance regarding upcoming cardiac imaging study; pt verbalizes understanding of appt date/time, parking situation and where to check in, pre-test NPO status and medications ordered, and verified current allergies; name and call back number provided for further questions should they arise ?Marchia Bond RN Navigator Cardiac Imaging ?[facility] ?[phone] office ?[phone] cell ? ?Difficult IV on R ?Changed metop to  15mg  ivabradine due to HR/BP ?Arrival 1100 ?

## 2021-08-23 ENCOUNTER — Ambulatory Visit (HOSPITAL_COMMUNITY)
Admission: RE | Admit: 2021-08-23 | Discharge: 2021-08-23 | Disposition: A | Discharge status: Home / Self Care | Payer: Medicare PPO | Source: Ambulatory Visit | Attending: Cardiology | Admitting: Cardiology

## 2021-08-23 ENCOUNTER — Other Ambulatory Visit: Payer: Self-pay

## 2021-08-23 DIAGNOSIS — R079 Chest pain, unspecified: Secondary | ICD-10-CM | POA: Diagnosis not present

## 2021-08-23 DIAGNOSIS — I700 Atherosclerosis of aorta: Secondary | ICD-10-CM | POA: Insufficient documentation

## 2021-08-23 DIAGNOSIS — I2510 Atherosclerotic heart disease of native coronary artery without angina pectoris: Secondary | ICD-10-CM | POA: Diagnosis not present

## 2021-08-23 MED ORDER — NITROGLYCERIN 0.4 MG SL SUBL
SUBLINGUAL_TABLET | SUBLINGUAL | Status: AC
  Filled 2021-08-23: qty 2

## 2021-08-23 MED ORDER — IOHEXOL 350 MG/ML SOLN
100.0000 mL | Freq: Once | INTRAVENOUS | Status: AC | PRN
  Administered 2021-08-23: 95 mL via INTRAVENOUS

## 2021-08-23 MED ORDER — NITROGLYCERIN 0.4 MG SL SUBL
0.8000 mg | SUBLINGUAL_TABLET | Freq: Once | SUBLINGUAL | Status: AC
  Administered 2021-08-23: 0.8 mg via SUBLINGUAL

## 2021-08-24 ENCOUNTER — Other Ambulatory Visit: Payer: Self-pay

## 2021-08-24 ENCOUNTER — Ambulatory Visit: Payer: Self-pay | Admitting: *Deleted

## 2021-08-24 ENCOUNTER — Emergency Department (HOSPITAL_BASED_OUTPATIENT_CLINIC_OR_DEPARTMENT_OTHER)
Admission: EM | Admit: 2021-08-24 | Discharge: 2021-08-24 | Disposition: A | Discharge status: Home / Self Care | Payer: Medicare PPO | Attending: Emergency Medicine | Admitting: Emergency Medicine

## 2021-08-24 ENCOUNTER — Emergency Department (HOSPITAL_BASED_OUTPATIENT_CLINIC_OR_DEPARTMENT_OTHER): Payer: Medicare PPO

## 2021-08-24 ENCOUNTER — Telehealth: Payer: Self-pay

## 2021-08-24 ENCOUNTER — Encounter (HOSPITAL_BASED_OUTPATIENT_CLINIC_OR_DEPARTMENT_OTHER): Payer: Self-pay

## 2021-08-24 DIAGNOSIS — Z20822 Contact with and (suspected) exposure to covid-19: Secondary | ICD-10-CM | POA: Insufficient documentation

## 2021-08-24 DIAGNOSIS — R42 Dizziness and giddiness: Secondary | ICD-10-CM | POA: Insufficient documentation

## 2021-08-24 DIAGNOSIS — B349 Viral infection, unspecified: Secondary | ICD-10-CM | POA: Diagnosis not present

## 2021-08-24 DIAGNOSIS — R5383 Other fatigue: Secondary | ICD-10-CM | POA: Diagnosis not present

## 2021-08-24 DIAGNOSIS — R0602 Shortness of breath: Secondary | ICD-10-CM | POA: Diagnosis not present

## 2021-08-24 DIAGNOSIS — H6501 Acute serous otitis media, right ear: Secondary | ICD-10-CM | POA: Insufficient documentation

## 2021-08-24 DIAGNOSIS — H6502 Acute serous otitis media, left ear: Secondary | ICD-10-CM | POA: Diagnosis not present

## 2021-08-24 DIAGNOSIS — K219 Gastro-esophageal reflux disease without esophagitis: Secondary | ICD-10-CM | POA: Diagnosis not present

## 2021-08-24 LAB — LIPASE, BLOOD: Lipase: 30 U/L (ref 11–51)

## 2021-08-24 LAB — COMPREHENSIVE METABOLIC PANEL
ALT: 17 U/L (ref 0–44)
AST: 23 U/L (ref 15–41)
Albumin: 4.4 g/dL (ref 3.5–5.0)
Alkaline Phosphatase: 41 U/L (ref 38–126)
Anion gap: 10 (ref 5–15)
BUN: 13 mg/dL (ref 8–23)
CO2: 24 mmol/L (ref 22–32)
Calcium: 9.4 mg/dL (ref 8.9–10.3)
Chloride: 101 mmol/L (ref 98–111)
Creatinine, Ser: 1.07 mg/dL — ABNORMAL HIGH (ref 0.44–1.00)
GFR, Estimated: 55 mL/min — ABNORMAL LOW (ref >60)
Glucose, Bld: 100 mg/dL — ABNORMAL HIGH (ref 70–99)
Potassium: 3.8 mmol/L (ref 3.5–5.1)
Sodium: 135 mmol/L (ref 135–145)
Total Bilirubin: 0.5 mg/dL (ref 0.3–1.2)
Total Protein: 7.1 g/dL (ref 6.5–8.1)

## 2021-08-24 LAB — CBC WITH DIFFERENTIAL/PLATELET
Abs Immature Granulocytes: 0.02 10*3/uL (ref 0.00–0.07)
Basophils Absolute: 0 10*3/uL (ref 0.0–0.1)
Basophils Relative: 1 %
Eosinophils Absolute: 0.1 10*3/uL (ref 0.0–0.5)
Eosinophils Relative: 1 %
HCT: 33.4 % — ABNORMAL LOW (ref 36.0–46.0)
Hemoglobin: 11.3 g/dL — ABNORMAL LOW (ref 12.0–15.0)
Immature Granulocytes: 0 %
Lymphocytes Relative: 21 %
Lymphs Abs: 1.4 10*3/uL (ref 0.7–4.0)
MCH: 27.9 pg (ref 26.0–34.0)
MCHC: 33.8 g/dL (ref 30.0–36.0)
MCV: 82.5 fL (ref 80.0–100.0)
Monocytes Absolute: 0.6 10*3/uL (ref 0.1–1.0)
Monocytes Relative: 8 %
Neutro Abs: 4.9 10*3/uL (ref 1.7–7.7)
Neutrophils Relative %: 69 %
Platelets: 182 10*3/uL (ref 150–400)
RBC: 4.05 MIL/uL (ref 3.87–5.11)
RDW: 12.9 % (ref 11.5–15.5)
WBC: 7 10*3/uL (ref 4.0–10.5)
nRBC: 0 % (ref 0.0–0.2)

## 2021-08-24 LAB — RESP PANEL BY RT-PCR (FLU A&B, COVID) ARPGX2
Influenza A by PCR: NEGATIVE
Influenza B by PCR: NEGATIVE
SARS Coronavirus 2 by RT PCR: NEGATIVE

## 2021-08-24 LAB — URINALYSIS, ROUTINE W REFLEX MICROSCOPIC
Bilirubin Urine: NEGATIVE
Glucose, UA: NEGATIVE mg/dL
Hgb urine dipstick: NEGATIVE
Ketones, ur: NEGATIVE mg/dL
Leukocytes,Ua: NEGATIVE
Nitrite: NEGATIVE
Protein, ur: NEGATIVE mg/dL
Specific Gravity, Urine: <1.005 — ABNORMAL LOW (ref 1.005–1.030)
pH: 5.5 (ref 5.0–8.0)

## 2021-08-24 LAB — TSH: TSH: 0.354 u[IU]/mL (ref 0.350–4.500)

## 2021-08-24 MED ORDER — AZITHROMYCIN 250 MG PO TABS: 250.0000 mg | ORAL_TABLET | Freq: Every day | ORAL | 0 refills | Status: DC

## 2021-08-24 MED ORDER — DEXTROSE-NACL 5-0.9 % IV SOLN: INTRAVENOUS | Status: DC

## 2021-08-24 NOTE — Telephone Encounter (Signed)
?  Chief Complaint: shortness of breath, lightheaded ness ?Symptoms: shortness of breath at rest feels better when moving, right ear pain possible allergies, lightheaded unbalanced walking.  ?Frequency: today  ?Pertinent Negatives: Patient denies chest pain , difficulty breathing, no weakness N/T on either side of body. No visual issues.  ?Disposition: [] ED /[x] Urgent Care (no appt availability in office) / [] Appointment(In office/virtual)/ []  [facility]/ [] Home Care/ [] Refused Recommended Disposition /[] [facility]/ []  Follow-up with PCP ?Additional Notes:  ? ?Patient attempted to contact PCP.  ? ? Reason for Disposition ? [1] MILD difficulty breathing (e.g., minimal/no SOB at rest, SOB with walking, pulse <100) AND [2] NEW-onset or WORSE than normal ? ?Answer Assessment - Initial Assessment Questions ?1. RESPIRATORY STATUS: "Describe your breathing?" (e.g., wheezing, shortness of breath, unable to speak, severe coughing)  ?    Shortness of breath at rest feels better moving  ?2. ONSET: "When did this breathing problem begin?"  ?    Started today after a walk ?3. PATTERN "Does the difficult breathing come and go, or has it been constant since it started?"  ?    Cant catch breath while sitting  ?4. SEVERITY: "How bad is your breathing?" (e.g., mild, moderate, severe)  ?  - MILD: No SOB at rest, mild SOB with walking, speaks normally in sentences, can lie down, no retractions, pulse < 100.  ?  - MODERATE: SOB at rest, SOB with minimal exertion and prefers to sit, cannot lie down flat, speaks in phrases, mild retractions, audible wheezing, pulse 100-120.  ?  - SEVERE: Very SOB at rest, speaks in single words, struggling to breathe, sitting hunched forward, retractions, pulse > 120  ?    Shortness of breath  ?5. RECURRENT SYMPTOM: "Have you had difficulty breathing before?" If Yes, ask: "When was the last time?" and "What happened that time?"  ?    No  ?6. CARDIAC HISTORY: "Do you have any  history of heart disease?" (e.g., heart attack, angina, bypass surgery, angioplasty)  ?    Acid reflux  ?7. LUNG HISTORY: "Do you have any history of lung disease?"  (e.g., pulmonary embolus, asthma, emphysema) ?    na ?8. CAUSE: "What do you think is causing the breathing problem?"  ?    Not sure  ?9. OTHER SYMPTOMS: "Do you have any other symptoms? (e.g., dizziness, runny nose, cough, chest pain, fever) ?    Right ear pain losing hearing and pops and comes back.  ?10. O2 SATURATION MONITOR:  "Do you use an oxygen saturation monitor (pulse oximeter) at home?" If Yes, "What is your reading (oxygen level) today?" "What is your usual oxygen saturation reading?" (e.g., 95%) ?      na ?11. PREGNANCY: "Is there any chance you are pregnant?" "When was your last menstrual period?" ?      na ?12. TRAVEL: "Have you traveled out of the country in the last month?" (e.g., travel history, exposures) ?      na ? ?Protocols used: Breathing Difficulty-A-AH ? ?

## 2021-08-24 NOTE — Telephone Encounter (Signed)
---  PT is lightheaded when she stands and is sob new ?this am. She was fine when she woke up. Then she felt ?loopy when she got up. Feels like something is stuck in ?her throat and is on med for reflux. He had a CT scan ?yesterday and they used contract and she had to take ?pills in the am to slow down her HR in the am for the ?CT scan. They told her HR had to be at a certain level ?for the scan. Ordered by a heart doctor bc she has been ?having pain in chest. CT looked good. No fever. ? ?Comments ?User: Hampton Abbot, RN Date/Time Lamount Cohen Time): 08/24/2021 3:25:28 PM ?not on heart med and no dx from cardiologist - never MI so far ? ?User: Hampton Abbot, RN Date/Time Lamount Cohen Time): 08/24/2021 3:26:54 PM ?the think stuck in her throat is not in chest - no chest pain and no tightness in chest today ? ?08/24/2021 3:37:21 PM Go to ED Now Ladona Ridgel, RN, Sunny Schlein ? ?User: Hampton Abbot, RN Date/Time Lamount Cohen Time): 08/24/2021 3:39:01 PM ?Told her will relay message to the office and should expect a call back shortly ? ?User: Hampton Abbot, RN Date/Time Lamount Cohen Time): 08/24/2021 3:40:42 PM ?Claris Che in the office says Her MD doesnt work on Fridays so she would not be getting a call back until Monday. ? ?User: Hampton Abbot, RN Date/Time Lamount Cohen Time): 08/24/2021 3:41:45 PM ?gave caller [name]'s input ? ?Referrals ?GO TO FACILITY REFUSED ?

## 2021-08-24 NOTE — ED Provider Notes (Signed)
?[facility] DEPT ?Provider Note ? ? ?CSN: [id number] ?Arrival date & time: 08/24/21  1709 ? ?  ? ?History ? ?Chief Complaint  ?Patient presents with  ? Shortness of Breath  ? ? ?Megan Good is a 71 y.o. female. ? ?Patient felt fine when she woke up this morning.  As the day progressed she started to feel fatigued was feeling of shortness of breath some dizziness felt off balance but not true room spinning.  Not like vertigo she has had in the past.  Some right ear fullness and discomfort feeling.  The ear has felt as if it has fluid on it for few days.  Patient's had a cough for about 2 weeks.  Patient states that she is continue to have weight loss due to her gastro esophageal reflux disease because she is only able to eat small amounts frequently.  Patient had CT scan of her abdomen and pelvis February 14 without any acute findings other than some constipation.  Patient had a CT coronary artery angiogram study done yesterday.  Which did not show any significant atherosclerotic disease.  Patient states that about 2 years ago she did have vertigo.  This dizziness feels a little bit different.  She denies any nausea or vomiting abdominal pain chest pain.  Denies any dysuria.  Patient is followed by GI medicine Dr. Nell Rangearl Gassner. ? ?Past medical history is significant for seasonal allergies thyroid disease hyperlipidemia gastroesophageal reflux disease.  Patient is on Protonix for that just started March 6 by her primary care doctor.  Patient also on Lopressor.  Thyroid medication not listed. ? ? ?  ? ?Home Medications ?Prior to Admission medications   ?Medication Sig Start Date End Date Taking? Authorizing Provider  ?azithromycin (ZITHROMAX) 250 MG tablet Take 1 tablet (250 mg total) by mouth daily. Take first 2 tablets together, then 1 every day until finished. 08/24/21  Yes Vanetta MuldersZackowski, Jentzen Minasyan, MD  ?Acetaminophen (TYLENOL PO) Take by mouth. AS NEEDED    [provider]   ?metoprolol tartrate (LOPRESSOR) 100 MG tablet Take 2 hours prior to CT 08/08/21   Tobb, Kardie, DO  ?Multiple Vitamins-Minerals (MULTIVITAMIN ADULTS PO) Take 1 tablet by mouth daily.    [provider]  ?pantoprazole (PROTONIX) 40 MG tablet Take 1 tablet (40 mg total) by mouth 2 (two) times daily. 08/13/21   Philip AspenHernandez Acosta, Limmie PatriciaEstela Y, MD  ?rosuvastatin (CRESTOR) 10 MG tablet Take 1 tablet (10 mg total) by mouth daily. 07/11/21   Philip AspenHernandez Acosta, Limmie PatriciaEstela Y, MD  ?   ? ?Allergies    ?Penicillins   ? ?Review of Systems   ?Review of Systems  ?Constitutional:  Positive for appetite change, fatigue and unexpected weight change. Negative for chills and fever.  ?HENT:  Negative for ear pain and sore throat.   ?Eyes:  Negative for pain and visual disturbance.  ?Respiratory:  Positive for cough and shortness of breath.   ?Cardiovascular:  Negative for chest pain and palpitations.  ?Gastrointestinal:  Negative for abdominal pain and vomiting.  ?Genitourinary:  Negative for dysuria and hematuria.  ?Musculoskeletal:  Negative for arthralgias and back pain.  ?Skin:  Negative for color change and rash.  ?Neurological:  Positive for dizziness and light-headedness. Negative for seizures, syncope, facial asymmetry, weakness and headaches.  ?Psychiatric/Behavioral:  Negative for confusion.   ?All other systems reviewed and are negative. ? ?Physical Exam ?Updated Vital Signs ?BP 124/67   Pulse 63   Temp 98.4 ?F (36.9 ?C)   Resp  15   Ht 1.6 m (5\' 3" )   Wt 53.1 kg   SpO2 100%   BMI 20.73 kg/m?  ?Physical Exam ? ?ED Results / Procedures / Treatments   ?Labs ?(all labs ordered are listed, but only abnormal results are displayed) ?Labs Reviewed  ?CBC WITH DIFFERENTIAL/PLATELET - Abnormal; Notable for the following components:  ?    Result Value  ? Hemoglobin 11.3 (*)   ? HCT 33.4 (*)   ? All other components within normal limits  ?COMPREHENSIVE METABOLIC PANEL - Abnormal; Notable for the following components:  ? Glucose, Bld 100  (*)   ? Creatinine, Ser 1.07 (*)   ? GFR, Estimated 55 (*)   ? All other components within normal limits  ?URINALYSIS, ROUTINE W REFLEX MICROSCOPIC - Abnormal; Notable for the following components:  ? Color, Urine COLORLESS (*)   ? Specific Gravity, Urine <1.005 (*)   ? All other components within normal limits  ?RESP PANEL BY RT-PCR (FLU A&B, COVID) [id number]  ?LIPASE, BLOOD  ?TSH  ? ? ?EKG ?EKG Interpretation ? ?Date/Time:  Friday August 24 2021 17:32:54 EDT ?Ventricular Rate:  80 ?PR Interval:  137 ?QRS Duration: 86 ?QT Interval:  358 ?QTC Calculation: 413 ?R Axis:   78 ?Text Interpretation: Sinus rhythm Consider left ventricular hypertrophy No significant change since last tracing Confirmed by 02-05-1991 [id number]) on 08/24/2021 5:40:37 PM ? ?Radiology ?CT CORONARY MORPH W/CTA COR W/SCORE W/CA W/CM &/OR WO/CM ? ?Addendum Date: 08/23/2021   ?ADDENDUM REPORT: 08/23/2021 17:53 CLINICAL DATA:  This is a 71 year old female with anginal symptoms. EXAM: Cardiac/Coronary  CTA TECHNIQUE: The patient was scanned on a 61. FINDINGS: A 100 kV prospective scan was triggered in the descending thoracic aorta at 111 HU's. Axial non-contrast 3 mm slices were carried out through the heart. The data set was analyzed on a dedicated work station and scored using the Agatson method. Gantry rotation speed was 250 msecs and collimation was .6 mm. No beta blockade and 0.8 mg of sl NTG was given. The 3D data set was reconstructed in 5% intervals of the 67-82 % of the R-R cycle. Diastolic phases were analyzed on a dedicated work station using MPR, MIP and VRT modes. The patient received 80 cc of contrast. Aorta: Normal size. Mild aortic root calcifications. No dissection. Aortic Valve:  Trileaflet.  No calcifications. Coronary Arteries:  Normal coronary origin.  Right dominance. RCA is a large dominant artery that gives rise to PDA and PLA. There is no plaque. Left main is a large artery that gives rise to LAD and LCX  arteries. LAD is a large vessel. There is a minimal (<24%) calcified plaque. There are no plaques in the mid and distal LAD. LCX is a non-dominant artery that gives rise to one large OM1 branch. There is no plaque. Coronary Calcium Score: Left main: 0 Left anterior descending artery: 14.7 Left circumflex artery: 0 Right coronary artery: 0 Total: 14.7 Percentile: 44 Other findings: Normal pulmonary vein drainage into the left atrium. Normal left atrial appendage without a thrombus. Normal size of the pulmonary artery. IMPRESSION: 1. Coronary calcium score of 14.7. This was 39 percentile for age and sex matched control. 2. Normal coronary origin with right dominance. 3. CAD-RADS 1. Minimal non-obstructive CAD (0-24%). Consider non-atherosclerotic causes of chest pain. Consider preventive therapy and risk factor modification. Electronically Signed   By: 59 D.O.   On: 08/23/2021 17:53  ? ?Result Date: 08/23/2021 ?EXAM: OVER-READ INTERPRETATION  CT CHEST The following report is an over-read performed by radiologist Dr. Marliss Coots of [facility] on 08/23/2021. This over-read does not include interpretation of cardiac or coronary anatomy or pathology. The coronary calcium score/coronary CTA interpretation by the cardiologist is attached. COMPARISON:  None. FINDINGS: Vascular: Normal caliber patent visualized thoracic aorta with scattered atherosclerotic calcifications. Mediastinum/Nodes: No enlarged mediastinal lymph nodes. The visualized trachea and esophagus demonstrate no significant findings. Lungs/Pleura: The visualized lungs are clear. No evidence of pleural effusion or pneumothorax. Upper Abdomen: No acute abnormality. Musculoskeletal: No chest wall mass or suspicious bone lesions identified. IMPRESSION: 1. No acute extra cardiac, intrathoracic abnormality. 2.  Aortic Atherosclerosis (ICD10-I70.0). Marliss Coots, MD [facility]  Electronically Signed: By: Marliss Coots M.D. On: 08/23/2021 15:15  ? ?DG Chest Port 1 View ? ?Result Date: 08/24/2021 ?CLINICAL DATA:  Fatigue, shortness of breath, and dizziness. EXAM: PORTABLE CHEST 1 VIEW COMPARISON:  Chest x

## 2021-08-24 NOTE — Discharge Instructions (Addendum)
Work-up here today reassuring.  Chest x-ray negative.  Labs without significant abnormalities.  Feel that symptoms could be the onset of a viral illness.  Feel that the lightheadedness and the feeling of dizziness would be related to your symptoms of fatigue.  No explanation for the shortness of breath oxygen saturations are good.  The right ear does show evidence of bulging but no distinct infection suggestive of fluid behind the ear.  Take the antibiotic Zithromycin as directed.  Make an appointment follow-up with your primary care doctor for recheck Tylenol.  Keep your appointment with gastroenterology for the upper endoscopy.  Return for any new or worse symptoms.  Any worse dizziness that she turns in the room spinning or any speech or visual problems or any weakness or numbness in your extremities. ? ?Also follow-up with your primary care doctor for your thyroid-stimulating hormone results and also your COVID and influenza results are pending.  They will show up on MyChart. ?

## 2021-08-24 NOTE — ED Triage Notes (Signed)
Patient c/o SOB, dizzy and fatigue that was sudden onset this morning. Denies injury  ?

## 2021-08-27 ENCOUNTER — Ambulatory Visit (INDEPENDENT_AMBULATORY_CARE_PROVIDER_SITE_OTHER): Payer: Medicare PPO

## 2021-08-27 ENCOUNTER — Telehealth: Payer: Self-pay

## 2021-08-27 DIAGNOSIS — K219 Gastro-esophageal reflux disease without esophagitis: Secondary | ICD-10-CM

## 2021-08-27 DIAGNOSIS — M5410 Radiculopathy, site unspecified: Secondary | ICD-10-CM

## 2021-08-27 DIAGNOSIS — E782 Mixed hyperlipidemia: Secondary | ICD-10-CM

## 2021-08-27 NOTE — Chronic Care Management (AMB) (Signed)
?Chronic Care Management  ? ?CCM RN Visit Note ? ?08/27/2021 ?Name: Megan Good MRN: [id number] DOB: [birth year] ? ?Subjective: ?Megan Good is a 71 Good.o. year old female who is a primary care patient of Megan Good, Megan PatriciaEstela Y, MD. The care management team was consulted for assistance with disease management and care coordination needs.   ? ?Engaged with patient by telephone for follow up visit in response to provider referral for case management and/or care coordination services.  ? ?Consent to Services:  ?The patient was given information about Chronic Care Management services, agreed to services, and gave verbal consent prior to initiation of services.  Please see initial visit note for detailed documentation.  ? ?Patient agreed to services and verbal consent obtained.  ? ?Assessment: Review of patient past medical history, allergies, medications, health status, including review of consultants reports, laboratory and other test data, was performed as part of comprehensive evaluation and provision of chronic care management services.  ? ?SDOH (Social Determinants of Health) assessments and interventions performed:   ? ?CCM Care Plan ? ?Allergies  ?Allergen Reactions  ? Penicillins Swelling and Rash  ?  Has patient had a PCN reaction causing immediate rash, facial/tongue/throat swelling, SOB or lightheadedness with hypotension: No ?Has patient had a PCN reaction causing severe rash involving mucus membranes or skin necrosis: No ?Has patient had a PCN reaction that required hospitalization: No ?Has patient had a PCN reaction occurring within the last 10 years: No ?If all of the above answers are "NO", then may proceed with Cephalosporin use. ? ?Burn-like rash  ? ? ?Outpatient Encounter Medications as of 08/27/2021  ?Medication Sig  ? Acetaminophen (TYLENOL PO) Take by mouth. AS NEEDED  ? azithromycin (ZITHROMAX) 250 MG tablet Take 1 tablet (250 mg total) by mouth daily. Take first 2 tablets  together, then 1 every day until finished.  ? metoprolol tartrate (LOPRESSOR) 100 MG tablet Take 2 hours prior to CT  ? Multiple Vitamins-Minerals (MULTIVITAMIN ADULTS PO) Take 1 tablet by mouth daily.  ? pantoprazole (PROTONIX) 40 MG tablet Take 1 tablet (40 mg total) by mouth 2 (two) times daily.  ? rosuvastatin (CRESTOR) 10 MG tablet Take 1 tablet (10 mg total) by mouth daily.  ? ?No facility-administered encounter medications on file as of 08/27/2021.  ? ? ?Patient Active Problem List  ? Diagnosis Date Noted  ? Osteoporosis 01/09/2021  ? GERD (gastroesophageal reflux disease) 12/07/2019  ? Hyperlipidemia 12/07/2019  ? Subclinical hyperthyroidism 12/07/2019  ? Radicular neuropathy 09/02/2019  ? ? ?Conditions to be addressed/monitored:HLD, GERD, and radicular neuropathy ? ?Care Plan : RN Care Manager Plan of Care  ?Updates made by [name], [name], RN since 08/27/2021 12:00 AM  ?  ? ?Problem: Chronic Disease Management and Care Coordination Needs (radicular neuropathy and HLD)   ?Priority: High  ?  ? ?Long-Range Goal: Establish Plan of Care for Chronic Disease Management Needs (Radicular Neuropathy and HLD)   ?Start Date: 04/17/2021  ?Expected End Date: 10/14/2021  ?Recent Progress: On track  ?Priority: High  ?Note:   ?Current Barriers:  ?Chronic Disease Management support and education needs related to HLD, GERD, and Radicular neuropathy  ?No Advanced Directives in place ?States she has been having more issues with her GERD and she had chest pain that was checked out which they think it was her GERD. States she has eating only one small meal a day of foods that she can tolerate.  States she has lost about 10 lbs in the last  month. States that her neuropathy pain has improved and she has not been taking the gabapentin. States she is still walking her dog twice a day daily for about an hour total each day.  States she sometimes gets numbness in her feet and legs that comes and goes.  States she is trying to eat  healthy.  States her ear is feeling better since she started taking antibiotic that the ED prescribed on 08/24/21. States she needs to reschedule her colonscopy  ?RNCM Clinical Goal(s):  ?Patient will verbalize understanding of plan for management of HLD and Radicular neuropathy as evidenced by voiced adherence to plan of care ?verbalize basic understanding of  HLD and Radicular neuropathy disease process and self health management plan as evidenced by voiced understanding and teach back ?take all medications exactly as prescribed and will call provider for medication related questions as evidenced by dispense report and pt verbalization ?attend all scheduled medical appointments:  Dr. Ardyth Harps 08/30/21 as evidenced by medical records ?demonstrate Ongoing adherence to prescribed treatment plan for HLD and Radicular neuropathy as evidenced by readings within limits and adherence to plan of care ?continue to work with RN Care Manager to address care management and care coordination needs related to  HLD and Radicular neuropathy as evidenced by adherence to CM Team Scheduled appointments through collaboration with RN Care manager, provider, and care team.  ? ?Interventions: ?1:1 collaboration with primary care provider regarding development and update of comprehensive plan of care as evidenced by provider attestation and co-signature ?Inter-disciplinary care team collaboration (see longitudinal plan of care) ?Evaluation of current treatment plan related to  self management and patient's adherence to plan as established by provider ? ?GERD  (Status:  New goal.)  Long Term Goal ?Evaluation of current treatment plan related to GERD,  GERD  self-management and patient's adherence to plan as established by provider. ?Discussed plans with patient for ongoing care management follow up and provided patient with direct contact information for care management team ?Evaluation of current treatment plan related to GERD and  patient's adherence to plan as established by provider ?Advised patient to discuss her recent tests, labs and  GI referral with provider at appointment on 08/30/21 ?Provided education to patient re: GERD ?Reviewed to try to eat more calories and protein to help her maintain her weight   ? ?Health Maintenance Interventions: ? (Status:  Goal on track:  Yes.) Long Term Goal ?Advised patient to discuss  Colonoscopy    ?Pneumonia Vaccine ?COVID vaccination    ?Hepatitis C test  with primary care provider  ?Patient provided CDC guideline information about COVID vaccine booster, Pneumonia vaccin   , and Shingles vaccine    ?Notified provider of adult immunization status and request for consideration of order for/scheduling of Colonoscopy    ?Pneumonia Vaccine ?Hepatitis C test ?Provided education about reinforced education shingles vaccine and Hepatitis C testing   ? ?Hyperlipidemia Interventions:  (Status:  Goal on track:  Yes.) Long Term Goal ?Medication review performed; medication list updated in electronic medical record.  ?Counseled on importance of regular laboratory monitoring as prescribed ?Reviewed role and benefits of statin for ASCVD risk reduction ?Reviewed importance of limiting foods high in cholesterol ?Reviewed exercise goals and target of 150 minutes per week ? ?Pain Interventions:  (Status:  Goal on track:  Yes.) Long Term Goal ?Pain assessment performed ?Medications reviewed ?Reviewed provider established plan for pain management ?Discussed importance of adherence to all scheduled medical appointments ?Counseled on the importance of reporting any/all  new or changed pain symptoms or management strategies to pain management provider ?Advised patient to report to care team affect of pain on daily activities ?Discussed use of relaxation techniques and/or diversional activities to assist with pain reduction (distraction, imagery, relaxation, massage, acupressure, TENS, heat, and cold application ?Reviewed  with patient prescribed pharmacological and nonpharmacological pain relief strategies ?Reinforced to pace activities    ?Patient Goals/Self-Care Activities: ?Take all medications as prescribed ?Attend all scheduled provider a

## 2021-08-27 NOTE — Telephone Encounter (Signed)
Transition Care Management Follow-up Telephone Call ?Date of discharge and from where: ED at [facility] on 08/24/21 ?How have you been since you were released from the hospital? Pt states she's been feeling better. States ear is better, but throat is scratchy & runny nose. ?Any questions or concerns? Yes ? ?Items Reviewed: ?Did the pt receive and understand the discharge instructions provided? No  ?Medications obtained and verified? Yes  ?Any new allergies since your discharge? No  ?Dietary orders reviewed? No ?Do you have support at home? Yes  ? ?Home Care and Equipment/Supplies: ?Were home health services ordered? not applicable ? ? ?Functional Questionnaire: (I = Independent and D = Dependent) ?ADLs: I ? ?Bathing/Dressing- I ? ?Meal Prep- I ? ?Eating- I ? ?Maintaining continence- I ? ?Transferring/Ambulation- I ? ?Managing Meds- I ? ?Follow up appointments reviewed: ? ?PCP Hospital f/u appt confirmed? Yes  Scheduled to see Ardyth Harps on 08/30/21 @ 2[. ?Specialist Hospital f/u appt confirmed?  N/a   ?Are transportation arrangements needed? No  ?If their condition worsens, is the pt aware to call PCP or go to the Emergency Dept.? Yes ?Was the patient provided with contact information for the PCP's office or ED? Yes ?Was to pt encouraged to call back with questions or concerns? Yes  ?

## 2021-08-27 NOTE — Patient Instructions (Addendum)
Visit Information ? ?Thank you for taking time to visit with me today. Please don't hesitate to contact me if I can be of assistance to you before our next scheduled telephone appointment. ? ?Following are the goals we discussed today:  ?Take all medications as prescribed ?Attend all scheduled provider appointments ?Call pharmacy for medication refills 3-7 days in advance of running out of medications ?Perform all self care activities independently  ?Perform IADL's (shopping, preparing meals, housekeeping, managing finances) independently ?Call provider office for new concerns or questions  ?call for medicine refill 2 or 3 days before it runs out ?take all medications exactly as prescribed ?call doctor with any symptoms you believe are related to your medicine ?adhere to prescribed diet: low fat heart healthy ?develop an exercise routine ?learn how to meditate ?- learn relaxation techniques ?- practice relaxation or meditation daily ?- think of new ways to do favorite things ?- use distraction techniques ?change to whole grain breads, cereal, pasta ?- eat smaller or less servings of red meat ?- fill half the plate with non-starchy vegetables ?- get blood test (fasting) done 1 week before next visit ?- increase the amount of fiber in food ?- read food labels for fat and fiber ?- switch to low-fat or skim milk ? ?Gastroesophageal Reflux Disease, Adult ?Gastroesophageal reflux (GER) happens when acid from the stomach flows up into the tube that connects the mouth and the stomach (esophagus). Normally, food travels down the esophagus and stays in the stomach to be digested. With GER, food and stomach acid sometimes move back up into the esophagus. ?You may have a disease called gastroesophageal reflux disease (GERD) if the reflux: ?Happens often. ?Causes frequent or very bad symptoms. ?Causes problems such as damage to the esophagus. ?When this happens, the esophagus becomes sore and swollen. Over time, GERD can make  small holes (ulcers) in the lining of the esophagus. ?What are the causes? ?This condition is caused by a problem with the muscle between the esophagus and the stomach. When this muscle is weak or not normal, it does not close properly to keep food and acid from coming back up from the stomach. ?The muscle can be weak because of: ?Tobacco use. ?Pregnancy. ?Having a certain type of hernia (hiatal hernia). ?Alcohol use. ?Certain foods and drinks, such as coffee, chocolate, onions, and peppermint. ?What increases the risk? ?Being overweight. ?Having a disease that affects your connective tissue. ?Taking NSAIDs, such a ibuprofen. ?What are the signs or symptoms? ?Heartburn. ?Difficult or painful swallowing. ?The feeling of having a lump in the throat. ?A bitter taste in the mouth. ?Bad breath. ?Having a lot of saliva. ?Having an upset or bloated stomach. ?Burping. ?Chest pain. Different conditions can cause chest pain. Make sure you see your doctor if you have chest pain. ?Shortness of breath or wheezing. ?A long-term cough or a cough at night. ?Wearing away of the surface of teeth (tooth enamel). ?Weight loss. ?How is this treated? ?Making changes to your diet. ?Taking medicine. ?Having surgery. ?Treatment will depend on how bad your symptoms are. ?Follow these instructions at home: ?Eating and drinking ? ?Follow a diet as told by your doctor. You may need to avoid foods and drinks such as: ?Coffee and tea, with or without caffeine. ?Drinks that contain alcohol. ?Energy drinks and sports drinks. ?Bubbly (carbonated) drinks or sodas. ?Chocolate and cocoa. ?Peppermint and mint flavorings. ?Garlic and onions. ?Horseradish. ?Spicy and acidic foods. These include peppers, chili powder, curry powder, vinegar, hot sauces, and BBQ  sauce. ?Citrus fruit juices and citrus fruits, such as oranges, lemons, and limes. ?Tomato-based foods. These include red sauce, chili, salsa, and pizza with red sauce. ?Fried and fatty foods. These  include donuts, french fries, potato chips, and high-fat dressings. ?High-fat meats. These include hot dogs, rib eye steak, sausage, ham, and bacon. ?High-fat dairy items, such as whole milk, butter, and cream cheese. ?Eat small meals often. Avoid eating large meals. ?Avoid drinking large amounts of liquid with your meals. ?Avoid eating meals during the 2-3 hours before bedtime. ?Avoid lying down right after you eat. ?Do not exercise right after you eat. ?Lifestyle ? ?Do not smoke or use any products that contain nicotine or tobacco. If you need help quitting, ask your doctor. ?Try to lower your stress. If you need help doing this, ask your doctor. ?If you are overweight, lose an amount of weight that is healthy for you. Ask your doctor about a safe weight loss goal. ?General instructions ?Pay attention to any changes in your symptoms. ?Take over-the-counter and prescription medicines only as told by your doctor. ?Do not take aspirin, ibuprofen, or other NSAIDs unless your doctor says it is okay. ?Wear loose clothes. Do not wear anything tight around your waist. ?Raise (elevate) the head of your bed about 6 inches (15 cm). You may need to use a wedge to do this. ?Avoid bending over if this makes your symptoms worse. ?Keep all follow-up visits. ?Contact a doctor if: ?You have new symptoms. ?You lose weight and you do not know why. ?You have trouble swallowing or it hurts to swallow. ?You have wheezing or a cough that keeps happening. ?You have a hoarse voice. ?Your symptoms do not get better with treatment. ?Get help right away if: ?You have sudden pain in your arms, neck, jaw, teeth, or back. ?You suddenly feel sweaty, dizzy, or light-headed. ?You have chest pain or shortness of breath. ?You vomit and the vomit is green, yellow, or black, or it looks like blood or coffee grounds. ?You faint. ?Your poop (stool) is red, bloody, or black. ?You cannot swallow, drink, or eat. ?These symptoms may represent a serious  problem that is an emergency. Do not wait to see if the symptoms will go away. Get medical help right away. Call your local emergency services (911 in the [place] Do not drive yourself to the hospital. ?Summary ?If a person has gastroesophageal reflux disease (GERD), food and stomach acid move back up into the esophagus and cause symptoms or problems such as damage to the esophagus. ?Treatment will depend on how bad your symptoms are. ?Follow a diet as told by your doctor. ?Take all medicines only as told by your doctor. ?This information is not intended to replace advice given to you by your health care provider. Make sure you discuss any questions you have with your health care provider. ?Document Revised: 12/06/2019 Document Reviewed: 12/06/2019 ?Elsevier Patient Education ? 2022 Elsevier [employer] ? ?Our next appointment is by telephone on 11/01/21 at 2:15 PM ? ?Please call the care guide team at [phone] if you need to cancel or reschedule your appointment.  ? ?If you are experiencing a Mental Health or Behavioral Health Crisis or need someone to talk to, please call the Suicide and Crisis Lifeline: 988 ?call the [place] National Suicide Prevention Lifeline: (267) 330-8818 or TTY: 220-802-2867 TTY 7658116404) to talk to a trained counselor ?call 1-800-273-TALK (toll free, 24 hour hotline) ?go to [facility] [address] [place] [phone]) ?call 911  ? ?  Patient verbalizes understanding of instructions and care plan provided today and agrees to view in MyChart. Active MyChart status confirmed with patient.   ?Dudley MajorMelissa Michele Kerlin RN, BSN,CCM, CDE ?Care Management Coordinator ?[facility] ?[phone]   ?

## 2021-08-30 ENCOUNTER — Ambulatory Visit (INDEPENDENT_AMBULATORY_CARE_PROVIDER_SITE_OTHER): Payer: Medicare PPO | Admitting: Internal Medicine

## 2021-08-30 ENCOUNTER — Encounter: Payer: Self-pay | Admitting: Internal Medicine

## 2021-08-30 VITALS — BP 100/64 | HR 84 | Temp 97.9°F | Wt 114.7 lb

## 2021-08-30 DIAGNOSIS — H6121 Impacted cerumen, right ear: Secondary | ICD-10-CM | POA: Diagnosis not present

## 2021-08-30 DIAGNOSIS — R1310 Dysphagia, unspecified: Secondary | ICD-10-CM | POA: Diagnosis not present

## 2021-08-30 DIAGNOSIS — Z09 Encounter for follow-up examination after completed treatment for conditions other than malignant neoplasm: Secondary | ICD-10-CM

## 2021-08-30 NOTE — Progress Notes (Signed)
? ? ? ?Established Patient Office Visit ? ? ? ? ?This visit occurred during the SARS-CoV-2 public health emergency.  Safety protocols were in place, including screening questions prior to the visit, additional usage of staff PPE, and extensive cleaning of exam room while observing appropriate contact time as indicated for disinfecting solutions.  ? ? ?CC/Reason for Visit: Hospital follow-up, discuss dysphagia ? ?HPI: Megan Good is a 71 y.o. female who is coming in today for the above mentioned reasons.  She was seen in the emergency department on March 17 for vertigo and shortness of breath.  Her work-up was unremarkable.  She continues to have decreased appetite and some weight loss related to dysphagia and GERD.  She has not had significant relief with twice daily PPI therapy.  She has lost 5 pounds since March 6.  Referral to GI is in process.  In the ED she was also found to have a right otitis media and was given azithromycin. ? ?Past Medical/Surgical History: ?Past Medical History:  ?Diagnosis Date  ? Allergy   ? SEASONAL  ? Arthritis   ? LEFT SHOULDER,HAND-BILATERAL  ? Cataract   ? BILATERAL-REMOVED,2014  ? Headache   ? Hyperlipidemia   ? Osteoporosis   ? Thyroid disease   ? ? ?Past Surgical History:  ?Procedure Laterality Date  ? BREAST BIOPSY Left 2003  ? TONSILLECTOMY    ? AS A CHILD  ? ? ?Social History: ? reports that she has never smoked. She has never used smokeless tobacco. She reports that she does not drink alcohol and does not use drugs. ? ?Allergies: ?Allergies  ?Allergen Reactions  ? Penicillins Swelling and Rash  ?  Has patient had a PCN reaction causing immediate rash, facial/tongue/throat swelling, SOB or lightheadedness with hypotension: No ?Has patient had a PCN reaction causing severe rash involving mucus membranes or skin necrosis: No ?Has patient had a PCN reaction that required hospitalization: No ?Has patient had a PCN reaction occurring within the last 10 years: No ?If all  of the above answers are "NO", then may proceed with Cephalosporin use. ? ?Burn-like rash  ? ? ?Family History:  ?Family History  ?Problem Relation Age of Onset  ? Healthy Mother 39  ? Other Father   ?     MVA  ? Stroke Maternal Grandmother   ? Colon cancer Neg Hx   ? Colon polyps Neg Hx   ? Esophageal cancer Neg Hx   ? Rectal cancer Neg Hx   ? Stomach cancer Neg Hx   ? ? ? ?Current Outpatient Medications:  ?  Acetaminophen (TYLENOL PO), Take by mouth. AS NEEDED, Disp: , Rfl:  ?  metoprolol tartrate (LOPRESSOR) 100 MG tablet, Take 2 hours prior to CT, Disp: 1 tablet, Rfl: 0 ?  Multiple Vitamins-Minerals (MULTIVITAMIN ADULTS PO), Take 1 tablet by mouth daily., Disp: , Rfl:  ?  pantoprazole (PROTONIX) 40 MG tablet, Take 1 tablet (40 mg total) by mouth 2 (two) times daily., Disp: 60 tablet, Rfl: 2 ?  rosuvastatin (CRESTOR) 10 MG tablet, Take 1 tablet (10 mg total) by mouth daily., Disp: 90 tablet, Rfl: 1 ? ?Review of Systems:  ?Constitutional: Denies fever, chills, diaphoresis, appetite change and fatigue.  ?HEENT: Denies photophobia, eye pain, redness, hearing loss, ear pain, congestion, sore throat, rhinorrhea, sneezing, mouth sores, neck pain, neck stiffness and tinnitus.   ?Respiratory: Denies SOB, DOE, cough, chest tightness,  and wheezing.   ?Cardiovascular: Denies chest pain, palpitations and leg swelling.  ?Gastrointestinal:  Denies nausea, vomiting, abdominal pain, diarrhea, constipation, blood in stool and abdominal distention.  ?Genitourinary: Denies dysuria, urgency, frequency, hematuria, flank pain and difficulty urinating.  ?Endocrine: Denies: hot or cold intolerance, sweats, changes in hair or nails, polyuria, polydipsia. ?Musculoskeletal: Denies myalgias, back pain, joint swelling, arthralgias and gait problem.  ?Skin: Denies pallor, rash and wound.  ?Neurological: Denies dizziness, seizures, syncope, weakness, light-headedness, numbness and headaches.  ?Hematological: Denies adenopathy. Easy bruising,  personal or family bleeding history  ?Psychiatric/Behavioral: Denies suicidal ideation, mood changes, confusion, nervousness, sleep disturbance and agitation ? ? ? ?Physical Exam: ?Vitals:  ? 08/30/21 1405  ?BP: 100/64  ?Pulse: 84  ?Temp: 97.9 ?F (36.6 ?C)  ?TempSrc: Oral  ?SpO2: 99%  ?Weight: 114 lb 11.2 oz (52 kg)  ? ? ?Body mass index is 20.32 kg/m?. ? ? ?Constitutional: NAD, calm, comfortable ?Eyes: PERRL, lids and conjunctivae normal, right tympanic membrane is obstructed by cerumen ?ENMT: Mucous membranes are moist. ?Respiratory: clear to auscultation bilaterally, no wheezing, no crackles. Normal respiratory effort. No accessory muscle use.  ?Cardiovascular: Regular rate and rhythm, no murmurs / rubs / gallops. No extremity edema. ?Neurologic: Grossly intact and nonfocal ?Psychiatric: Normal judgment and insight. Alert and oriented x 3. Normal mood.  ? ? ?Impression and Plan: ? ?Hospital discharge follow-up ? ? ?Dysphagia, unspecified type  ?- Plan: Ambulatory referral to Gastroenterology GERD and is on twice daily PPI therapy. ?-She has significant documented weight loss.  States she is unable to swallow most solids and is now having issues with liquids as well.  She does have a history of ? ?Hearing loss of right ear due to cerumen impaction  ?- Plan: Ambulatory referral to ENT ?-She is afraid she might have vertigo if we irrigate her ear, she prefers referral to ENT. ? ? ? ?Time spent: 31 minutes reviewing chart, interviewing and examining patient and formulating plan of care. ? ? ? ? ? ? ?Chaya Jan, MD ?[facility] ? ? ?

## 2021-08-31 ENCOUNTER — Encounter: Payer: Self-pay | Admitting: Internal Medicine

## 2021-09-07 DIAGNOSIS — E785 Hyperlipidemia, unspecified: Secondary | ICD-10-CM

## 2021-09-13 ENCOUNTER — Ambulatory Visit: Payer: Medicare PPO | Admitting: Internal Medicine

## 2021-09-13 ENCOUNTER — Encounter: Payer: Self-pay | Admitting: Internal Medicine

## 2021-09-13 ENCOUNTER — Other Ambulatory Visit (INDEPENDENT_AMBULATORY_CARE_PROVIDER_SITE_OTHER): Payer: Medicare PPO

## 2021-09-13 VITALS — BP 114/56 | HR 72 | Ht 63.0 in | Wt 112.0 lb

## 2021-09-13 DIAGNOSIS — D649 Anemia, unspecified: Secondary | ICD-10-CM

## 2021-09-13 DIAGNOSIS — R634 Abnormal weight loss: Secondary | ICD-10-CM

## 2021-09-13 DIAGNOSIS — R1310 Dysphagia, unspecified: Secondary | ICD-10-CM | POA: Diagnosis not present

## 2021-09-13 LAB — IBC + FERRITIN
Ferritin: 138.3 ng/mL (ref 10.0–291.0)
Iron: 66 ug/dL (ref 42–145)
Saturation Ratios: 23.2 % (ref 20.0–50.0)
TIBC: 284.2 ug/dL (ref 250.0–450.0)
Transferrin: 203 mg/dL — ABNORMAL LOW (ref 212.0–360.0)

## 2021-09-13 MED ORDER — SUCRALFATE 1 GM/10ML PO SUSP: 1.0000 g | Freq: Two times a day (BID) | ORAL | 1 refills | Status: DC

## 2021-09-13 NOTE — Patient Instructions (Addendum)
If you are age 72 or older, your body mass index should be between 23-30. Your Body mass index is 19.84 kg/m?Marland Kitchen If this is out of the aforementioned range listed, please consider follow up with your Primary Care Provider. ? ?If you are age 2 or younger, your body mass index should be between 19-25. Your Body mass index is 19.84 kg/m?Marland Kitchen If this is out of the aformentioned range listed, please consider follow up with your Primary Care Provider.  ? ?You have been scheduled for an endoscopy. Please follow written instructions given to you at your visit today. ?If you use inhalers (even only as needed), please bring them with you on the day of your procedure. ? ? ?Your provider has requested that you go to the basement level for lab work before leaving today. Press "B" on the elevator. The lab is located at the first door on the left as you exit the elevator. ? ?We have sent the following medications to your pharmacy for you to pick up at your convenience: ? ?The [facility] providers would like to encourage you to use [facility] to communicate with providers for non-urgent requests or questions.  Due to long hold times on the telephone, sending your provider a message by [facility] may be a faster and more efficient way to get a response.  Please allow 48 business hours for a response.  Please remember that this is for non-urgent requests.  ? ?It was a pleasure to see you today! ? ?Thank you for trusting me with your gastrointestinal care!   ? ?Christia Reading , MD  ? ? ?

## 2021-09-13 NOTE — Progress Notes (Signed)
? ?Chief Complaint: Weight loss, dysphagia ? ?HPI : 72 year old female with history of HFpEF, osteoporosis, hypothyroidism presents with weight loss and dysphagia ? ?Starting in 07/2021, she started noticing some difficulty with eating. She feels like she has been having some food stuck in her throat. She keeps having pain in her upper chest. When she eats, she feels like food gets stuck in the LUQ. After chewing her food more, she does feel like this has helped. She has chest burning. She sleeps propped up at night on a wedge pillow. If she slides down, she will feel pain in her throat. She is taking pantoprazole 40 mg BID, but she still has some symptoms despite being on PPI. She can't eat too much without experiencing discomfort in her chest. She has already had a cardiac work up that was unremarkable. Denies prior EGD. She can only swallow small pills. She will gag if she was eating certain foods. Denies NSAID use. She uses Tylenol for pain control. She has one BM every day in the past, but she has noticed that she has been more constipated recently. She was supposed to get a colonoscopy scheduled. She has lost 15 lbs over the last 2 months. She has never had a colonoscopy in the past. Denies blood in stools. Denies fam hx of GI cancers. ? ?Wt Readings from Last 3 Encounters:  ?09/13/21 112 lb (50.8 kg)  ?08/30/21 114 lb 11.2 oz (52 kg)  ?08/24/21 117 lb (53.1 kg)  ? ? ?Past Medical History:  ?Diagnosis Date  ? Allergy   ? SEASONAL  ? Arthritis   ? LEFT SHOULDER,HAND-BILATERAL  ? Cataract   ? BILATERAL-REMOVED,2014  ? GERD (gastroesophageal reflux disease)   ? Headache   ? Hyperlipidemia   ? Osteoporosis   ? Thyroid disease   ? ? ? ?Past Surgical History:  ?Procedure Laterality Date  ? BREAST BIOPSY Left 2003  ? TONSILLECTOMY    ? AS A CHILD  ? ?Family History  ?Problem Relation Age of Onset  ? Healthy Mother 55  ? Other Father   ?     MVA  ? Other Sister   ?     prediabetes  ? Stroke Maternal Grandmother   ?  Diabetes Maternal Grandfather   ? Diabetes Maternal Uncle   ? Diabetes Maternal Uncle   ? Gestational diabetes Daughter   ? Colon cancer Neg Hx   ? Colon polyps Neg Hx   ? Esophageal cancer Neg Hx   ? Rectal cancer Neg Hx   ? Stomach cancer Neg Hx   ? ?Social History  ? ?Tobacco Use  ? Smoking status: Never  ? Smokeless tobacco: Never  ?Vaping Use  ? Vaping Use: Never used  ?Substance Use Topics  ? Alcohol use: Never  ? Drug use: Never  ? ?Current Outpatient Medications  ?Medication Sig Dispense Refill  ? Acetaminophen (TYLENOL PO) Take by mouth. AS NEEDED    ? Multiple Vitamins-Minerals (MULTIVITAMIN ADULTS PO) Take 1 tablet by mouth daily.    ? pantoprazole (PROTONIX) 40 MG tablet Take 1 tablet (40 mg total) by mouth 2 (two) times daily. 60 tablet 2  ? rosuvastatin (CRESTOR) 10 MG tablet Take 1 tablet (10 mg total) by mouth daily. 90 tablet 1  ? ?No current facility-administered medications for this visit.  ? ?Allergies  ?Allergen Reactions  ? Penicillins Swelling and Rash  ?  Has patient had a PCN reaction causing immediate rash, facial/tongue/throat swelling, SOB or lightheadedness  with hypotension: No ?Has patient had a PCN reaction causing severe rash involving mucus membranes or skin necrosis: No ?Has patient had a PCN reaction that required hospitalization: No ?Has patient had a PCN reaction occurring within the last 10 years: No ?If all of the above answers are "NO", then may proceed with Cephalosporin use. ? ?Burn-like rash  ? ? ? ?Review of Systems: ?All systems reviewed and negative except where noted in HPI.  ? ?Physical Exam: ?BP (!) 114/56 (BP Location: Left Arm, Patient Position: Sitting, Cuff Size: Normal)   Pulse 72   Ht 5\' 3"  (1.6 m)   Wt 112 lb (50.8 kg)   BMI 19.84 kg/m?  ?Constitutional: Pleasant,well-developed, female in no acute distress. ?HEENT: Normocephalic and atraumatic. Conjunctivae are normal. No scleral icterus. ?Cardiovascular: Normal rate, regular rhythm.  ?Pulmonary/chest:  Effort normal and breath sounds normal. No wheezing, rales or rhonchi. ?Abdominal: Soft, nondistended, nontender. Bowel sounds active throughout. There are no masses palpable. No hepatomegaly. ?Extremities: No edema ?Neurological: Alert and oriented to person place and time. ?Skin: Skin is warm and dry. No rashes noted. ?Psychiatric: Normal mood and affect. Behavior is normal. ? ?Labs 08/2021: CMP with mildly elevated Cr of 1.07. Lipase nml. CBC with mildly dec Hb of 11.3 ? ?CT A/P w/contrast 07/24/21: ?IMPRESSION: ?1. No acute intra-abdominal process. ?2. Moderate amount of retained stool in the colon suggesting ?constipation. ? ?ASSESSMENT AND PLAN: ?Weight loss ?Dysphagia ?Anemia ?Patient presents with weight loss and dysphagia over the last month.  On her most recent labs she was noted to have a mildly decreased hemoglobin as well.  We will plan to check her iron level and rule out celiac disease as a contributor. Will have the patient start carafate to see this will help with her symptoms. Will also plan for EGD for further evaluation ?- Check ferritin, iron/TIBC, TTG IgA, IgA ?- Start carafate susp 1 g QID ?- Nutrition consult for weight loss ?- EGD LEC ?- Patient not interested in colonoscopy at this time ?- Consider chest CT in the future ? ?07/26/21, MD ? ?I spent 61 minutes of time, including in depth chart review, independent review of results as outlined above, communicating results with the patient directly, face-to-face time with the patient, coordinating care, ordering studies and medications as appropriate, and documentation.   ? ?

## 2021-09-14 LAB — IGA: Immunoglobulin A: 203 mg/dL (ref 70–320)

## 2021-09-14 LAB — TISSUE TRANSGLUTAMINASE, IGA: (tTG) Ab, IgA: <1 U/mL

## 2021-09-28 ENCOUNTER — Ambulatory Visit: Payer: Medicare PPO | Admitting: Physical Therapy

## 2021-10-05 ENCOUNTER — Encounter: Payer: Self-pay | Admitting: Internal Medicine

## 2021-10-05 ENCOUNTER — Ambulatory Visit (AMBULATORY_SURGERY_CENTER): Payer: Medicare PPO | Admitting: Internal Medicine

## 2021-10-05 VITALS — BP 110/72 | HR 65 | Temp 98.2°F | Resp 12 | Ht 63.0 in | Wt 112.0 lb

## 2021-10-05 DIAGNOSIS — B3781 Candidal esophagitis: Secondary | ICD-10-CM | POA: Diagnosis not present

## 2021-10-05 DIAGNOSIS — R634 Abnormal weight loss: Secondary | ICD-10-CM | POA: Diagnosis not present

## 2021-10-05 DIAGNOSIS — K2970 Gastritis, unspecified, without bleeding: Secondary | ICD-10-CM

## 2021-10-05 DIAGNOSIS — R1013 Epigastric pain: Secondary | ICD-10-CM

## 2021-10-05 DIAGNOSIS — K2950 Unspecified chronic gastritis without bleeding: Secondary | ICD-10-CM | POA: Diagnosis not present

## 2021-10-05 DIAGNOSIS — D649 Anemia, unspecified: Secondary | ICD-10-CM | POA: Diagnosis not present

## 2021-10-05 DIAGNOSIS — R1310 Dysphagia, unspecified: Secondary | ICD-10-CM | POA: Diagnosis not present

## 2021-10-05 MED ORDER — SODIUM CHLORIDE 0.9 % IV SOLN: 500.0000 mL | INTRAVENOUS | Status: DC

## 2021-10-05 NOTE — Progress Notes (Signed)
? ?GASTROENTEROLOGY PROCEDURE H&P NOTE  ? ?Primary Care Physician: ?Philip Aspen, Limmie Patricia, MD ? ? ? ?Reason for Procedure:   Dysphagia, weight loss ? ?Plan:    EGD ? ?Patient is appropriate for endoscopic procedure(s) in the ambulatory ([facility]) setting. ? ?The nature of the procedure, as well as the risks, benefits, and alternatives were carefully and thoroughly reviewed with the patient. Ample time for discussion and questions allowed. The patient understood, was satisfied, and agreed to proceed.  ? ? ? ?HPI: ?Megan Good is a 71 y.o. female who presents for EGD for evaluation of dysphagia and weight loss .  Patient was most recently seen in the Gastroenterology Clinic on 09/13/21.  No interval change in medical history since that appointment. Please refer to that note for full details regarding GI history and clinical presentation.  ? ?Past Medical History:  ?Diagnosis Date  ? Allergy   ? SEASONAL  ? Arthritis   ? LEFT SHOULDER,HAND-BILATERAL  ? Cataract   ? BILATERAL-REMOVED,2014  ? GERD (gastroesophageal reflux disease)   ? Headache   ? Hyperlipidemia   ? Osteoporosis   ? Thyroid disease   ? ? ?Past Surgical History:  ?Procedure Laterality Date  ? BREAST BIOPSY Left 2003  ? TONSILLECTOMY    ? AS A CHILD  ? ? ?Prior to Admission medications   ?Medication Sig Start Date End Date Taking? Authorizing Provider  ?Acetaminophen (TYLENOL PO) Take by mouth. AS NEEDED   Yes [provider]  ?Multiple Vitamins-Minerals (MULTIVITAMIN ADULTS PO) Take 1 tablet by mouth daily.   Yes [provider]  ?pantoprazole (PROTONIX) 40 MG tablet Take 1 tablet (40 mg total) by mouth 2 (two) times daily. 08/13/21  Yes Philip Aspen, Limmie Patricia, MD  ?rosuvastatin (CRESTOR) 10 MG tablet Take 1 tablet (10 mg total) by mouth daily. 07/11/21  Yes Philip Aspen, Limmie Patricia, MD  ?sucralfate (CARAFATE) 1 GM/10ML suspension Take 10 mLs (1 g total) by mouth 2 (two) times daily. 09/13/21  Yes Imogene Burn, MD   ? ? ?Current Outpatient Medications  ?Medication Sig Dispense Refill  ? Acetaminophen (TYLENOL PO) Take by mouth. AS NEEDED    ? Multiple Vitamins-Minerals (MULTIVITAMIN ADULTS PO) Take 1 tablet by mouth daily.    ? pantoprazole (PROTONIX) 40 MG tablet Take 1 tablet (40 mg total) by mouth 2 (two) times daily. 60 tablet 2  ? rosuvastatin (CRESTOR) 10 MG tablet Take 1 tablet (10 mg total) by mouth daily. 90 tablet 1  ? sucralfate (CARAFATE) 1 GM/10ML suspension Take 10 mLs (1 g total) by mouth 2 (two) times daily. 420 mL 1  ? ?Current Facility-Administered Medications  ?Medication Dose Route Frequency Provider Last Rate Last Admin  ? 0.9 %  sodium chloride infusion  500 mL Intravenous Continuous Imogene Burn, MD      ? ? ?Allergies as of 10/05/2021 - Review Complete 09/13/2021  ?Allergen Reaction Noted  ? Penicillins Swelling and Rash 02/14/2018  ? ? ?Family History  ?Problem Relation Age of Onset  ? Healthy Mother 33  ? Other Father   ?     MVA  ? Other Sister   ?     prediabetes  ? Stroke Maternal Grandmother   ? Diabetes Maternal Grandfather   ? Diabetes Maternal Uncle   ? Diabetes Maternal Uncle   ? Gestational diabetes Daughter   ? Colon cancer Neg Hx   ? Colon polyps Neg Hx   ? Esophageal cancer Neg Hx   ?  Rectal cancer Neg Hx   ? Stomach cancer Neg Hx   ? ? ?Social History  ? ?Socioeconomic History  ? Marital status: Divorced  ?  Spouse name: Not on file  ? Number of children: 1  ? Years of education: Not on file  ? Highest education level: 12th grade  ?Occupational History  ? Occupation: QUALITY ASSURANCE ANALYSIS  ?  Employer: [employer]  ?  Comment: retired 2019  ?Tobacco Use  ? Smoking status: Never  ? Smokeless tobacco: Never  ?Vaping Use  ? Vaping Use: Never used  ?Substance and Sexual Activity  ? Alcohol use: Never  ? Drug use: Never  ? Sexual activity: Not on file  ?Other Topics Concern  ? Not on file  ?Social History Narrative  ? Patient is right-handed. She lives alone in a 2 story house.  She occasionally drinks coffee on the weekends. She walks 2-4 miles per day weather permitting. Recently she has been unable to walk dues to headaches.  ? ?Social Determinants of Health  ? ?Financial Resource Strain: Low Risk   ? Difficulty of Paying Living Expenses: Not hard at all  ?Food Insecurity: No Food Insecurity  ? Worried About Programme researcher, broadcasting/film/video in the Last Year: Never true  ? Ran Out of Food in the Last Year: Never true  ?Transportation Needs: No Transportation Needs  ? Lack of Transportation (Medical): No  ? Lack of Transportation (Non-Medical): No  ?Physical Activity: Sufficiently Active  ? Days of Exercise per Week: 7 days  ? Minutes of Exercise per Session: 150+ min  ?Stress: No Stress Concern Present  ? Feeling of Stress : Only a little  ?Social Connections: Moderately Isolated  ? Frequency of Communication with Friends and Family: More than three times a week  ? Frequency of Social Gatherings with Friends and Family: Once a week  ? Attends Religious Services: 1 to 4 times per year  ? Active Member of Clubs or Organizations: No  ? Attends Banker Meetings: Never  ? Marital Status: Divorced  ?Intimate Partner Violence: Not on file  ? ? ?Physical Exam: ?Vital signs in last 24 hours: ?BP (!) 141/73   Pulse 66   Temp 98.2 ?F (36.8 ?C) (Temporal)   Ht 5\' 3"  (1.6 m)   Wt 112 lb (50.8 kg)   SpO2 100%   BMI 19.84 kg/m?  ?GEN: NAD ?EYE: Sclerae anicteric ?ENT: MMM ?CV: Non-tachycardic ?Pulm: No increased WOB ?GI: Soft ?NEURO:  Alert & Oriented ? ? ? , MD ?[facility] ? ? ?10/05/2021 9:26 AM ? ?

## 2021-10-05 NOTE — Patient Instructions (Addendum)
Read all of the handouts given to you by your recovery room nurse. See your dates for your follow-up and colonoscopy with Dr Lorenso Courier. ? ?YOU HAD AN ENDOSCOPIC PROCEDURE TODAY AT [facility]:   Refer to the procedure report that was given to you for any specific questions about what was found during the examination.  If the procedure report does not answer your questions, please call your gastroenterologist to clarify.  If you requested that your care partner not be given the details of your procedure findings, then the procedure report has been included in a sealed envelope for you to review at your convenience later. ? ?YOU SHOULD EXPECT: Some feelings of bloating in the abdomen. Passage of more gas than usual.  Walking can help get rid of the air that was put into your GI tract during the procedure and reduce the bloating.  ? ?Please Note:  You might notice some irritation and congestion in your nose or some drainage.  This is from the oxygen used during your procedure.  There is no need for concern and it should clear up in a day or so. ? ?SYMPTOMS TO REPORT IMMEDIATELY: ? ?Following upper endoscopy (EGD) ? Vomiting of blood or coffee ground material ? New chest pain or pain under the shoulder blades ? Painful or persistently difficult swallowing ? New shortness of breath ? Fever of 100?F or higher ? Black, tarry-looking stools ? ?For urgent or emergent issues, a gastroenterologist can be reached at any hour by calling [phone]. ?Do not use MyChart messaging for urgent concerns.  ? ? ?DIET:  We do recommend a small meal at first, but then you may proceed to your regular diet.  Drink plenty of fluids but you should avoid alcoholic beverages for 24 hours. ? ?ACTIVITY:  You should plan to take it easy for the rest of today and you should NOT DRIVE or use heavy machinery until tomorrow (because of the sedation medicines used during the test).   ? ?FOLLOW UP: ?Our staff will call the number  listed on your records 48-72 hours following your procedure to check on you and address any questions or concerns that you may have regarding the information given to you following your procedure. If we do not reach you, we will leave a message.  We will attempt to reach you two times.  During this call, we will ask if you have developed any symptoms of COVID 19. If you develop any symptoms (ie: fever, flu-like symptoms, shortness of breath, cough etc.) before then, please call [phone].  If you test positive for Covid 19 in the 2 weeks post procedure, please call and report this information to [place].   ? ?If any biopsies were taken you will be contacted by phone or by letter within the next 1-3 weeks.  Please call us at [phone] if you have not heard about the biopsies in 3 weeks.  ? ? ?SIGNATURES/CONFIDENTIALITY: ?You and/or your care partner have signed paperwork which will be entered into your electronic medical record.  These signatures attest to the fact that that the information above on your After Visit Summary has been reviewed and is understood.  Full responsibility of the confidentiality of this discharge information lies with you and/or your care-partner.  ?

## 2021-10-05 NOTE — Progress Notes (Signed)
A and O x3. Report to RN. Tolerated MAC anesthesia well.Teeth unchanged after procedure. 

## 2021-10-05 NOTE — Progress Notes (Signed)
Pt's states no medical or surgical changes since previsit or office visit. 

## 2021-10-05 NOTE — Progress Notes (Signed)
Called to room to assist during endoscopic procedure.  Patient ID and intended procedure confirmed with present staff. Received instructions for my participation in the procedure from the performing physician.  

## 2021-10-05 NOTE — Op Note (Signed)
[facility] ?Patient Name: Megan Heinlein ?Procedure Date: 10/05/2021 9:52 AM ?MRN: [id number] ?Endoscopist: Nicole Kindred "Eulah Pont ,  ?Age: 71 ?Referring MD:  ?Date of Birth: 08-Mar-1951 ?Gender: Female ?Account #: [id number] ?Procedure:                Upper GI endoscopy ?Indications:              Dysphagia, Weight loss ?Medicines:                Monitored Anesthesia Care ?Procedure:                Pre-Anesthesia Assessment: ?                          - Prior to the procedure, a History and Physical  ?                          was performed, and patient medications and  ?                          allergies were reviewed. The patient's tolerance of  ?                          previous anesthesia was also reviewed. The risks  ?                          and benefits of the procedure and the sedation  ?                          options and risks were discussed with the patient.  ?                          All questions were answered, and informed consent  ?                          was obtained. Prior Anticoagulants: The patient has  ?                          taken no previous anticoagulant or antiplatelet  ?                          agents. ASA Grade Assessment: III - A patient with  ?                          severe systemic disease. After reviewing the risks  ?                          and benefits, the patient was deemed in  ?                          satisfactory condition to undergo the procedure. ?                          After obtaining informed consent, the endoscope was  ?  passed under direct vision. Throughout the  ?                          procedure, the patient's blood pressure, pulse, and  ?                          oxygen saturations were monitored continuously. The  ?                          Endoscope was introduced through the mouth, and  ?                          advanced to the second part of duodenum. The upper  ?                          GI endoscopy was  accomplished without difficulty.  ?                          The patient tolerated the procedure well. ?Scope In: ?Scope Out: ?Findings:                 White nummular lesions were noted in the entire  ?                          esophagus. Biopsies were taken with a cold forceps  ?                          for histology. ?                          Localized mildly erythematous mucosa without  ?                          bleeding was found in the gastric antrum. Biopsies  ?                          were taken with a cold forceps for histology. ?                          Multiple small sessile polyps with no bleeding and  ?                          no stigmata of recent bleeding were found in the  ?                          gastric body. Biopsies were taken with a cold  ?                          forceps for histology. ?                          A few localized erosions without bleeding were  ?                          found  in the duodenal bulb. Biopsies were taken  ?                          with a cold forceps for histology. ?Complications:            No immediate complications. ?Estimated Blood Loss:     Estimated blood loss was minimal. ?Impression:               - White nummular lesions in esophageal mucosa.  ?                          Biopsied. ?                          - Erythematous mucosa in the antrum. Biopsied. ?                          - Multiple gastric polyps. Biopsied. ?                          - Duodenal erosions without bleeding. Biopsied. ?Recommendation:           - Discharge patient to home (with escort). ?                          - Await pathology results. ?                          - Return to GI clinic in 1 month. ?                          - The findings and recommendations were discussed  ?                          with the patient. ?Particia Lather,  ?10/05/2021 10:27:22 AM ?

## 2021-10-09 ENCOUNTER — Telehealth: Payer: Self-pay | Admitting: Internal Medicine

## 2021-10-09 ENCOUNTER — Telehealth: Payer: Self-pay

## 2021-10-09 NOTE — Telephone Encounter (Signed)
Patient called in for update on ENT referral... rt earn continues to be painful..... Please call back to give update [phone] ?

## 2021-10-09 NOTE — Telephone Encounter (Signed)
Sent to [facility] ?[address] [place], [place] [address] ?[phone]. ?Patient is aware. ?

## 2021-10-09 NOTE — Telephone Encounter (Signed)
?  Follow up Call- ? ? ?  10/05/2021  ?  9:22 AM  ?Call back number  ?Post procedure Call Back phone  # [phone]  ?Permission to leave phone message Yes  ?  ? ?Patient questions: ? ?Do you have a fever, pain , or abdominal swelling? No. ?Pain Score  0 * ? ?Have you tolerated food without any problems? Yes.   ? ?Have you been able to return to your normal activities? Yes.   ? ?Do you have any questions about your discharge instructions: ?Diet   No. ?Medications  No. ?Follow up visit  No. ? ?Do you have questions or concerns about your Care? No. ? ?Actions: ?* If pain score is 4 or above: ?No action needed, pain <4. ? ? ?

## 2021-10-10 ENCOUNTER — Encounter: Payer: Self-pay | Admitting: Internal Medicine

## 2021-10-10 ENCOUNTER — Other Ambulatory Visit: Payer: Self-pay

## 2021-10-10 DIAGNOSIS — B379 Candidiasis, unspecified: Secondary | ICD-10-CM

## 2021-10-10 MED ORDER — FLUCONAZOLE 200 MG PO TABS: ORAL_TABLET | ORAL | 0 refills | Status: AC

## 2021-10-10 NOTE — Progress Notes (Signed)
Hi Ammie, please let the patient know that her esophagus biopsies showed that she has a yeast infection with candida. Please start the patient on fluconazole 400 mg on day 1, followed by 200 mg QD for 13 days.

## 2021-10-18 ENCOUNTER — Telehealth: Payer: Self-pay | Admitting: Internal Medicine

## 2021-10-18 NOTE — Telephone Encounter (Signed)
Patient called said she was given antibiotics after her procedure. She said she's been taking them but felt light headed and is not sure if its coming from the medication or not. Please advise. ?

## 2021-10-18 NOTE — Telephone Encounter (Signed)
Returned pt call. States she has woke up last night to go to the BR and felt dizzy. Proceeded to state she had also been to the ED in March and had f/u with her PCP following a viral infection and infection of her ear. PCP advised her ear was too impacted and inflamed to irrigate. Pt was then referred to a specialist to further address and treat, appt scheduled 10/26/21. States when she developed the dizziness, she called her PCP for an appt. States she will see her PCP on Monday for her to look at her ear. Advised it is likely her symptoms are related to the issues with her ear and not to the medication itself. Advised she stay well hydrated, keep appt as scheduled with her PCP and with her specialist. Also advised to proceed to the ED if she develops worsening symptoms. Verbalized acceptance and understanding. Routing this message to Dr. Lorenso Courier for further review and for continuity of care. ?

## 2021-10-22 ENCOUNTER — Ambulatory Visit (INDEPENDENT_AMBULATORY_CARE_PROVIDER_SITE_OTHER): Payer: Medicare PPO | Admitting: Internal Medicine

## 2021-10-22 ENCOUNTER — Encounter: Payer: Self-pay | Admitting: Internal Medicine

## 2021-10-22 VITALS — BP 110/64 | HR 64 | Temp 97.4°F | Wt 110.1 lb

## 2021-10-22 DIAGNOSIS — B3781 Candidal esophagitis: Secondary | ICD-10-CM

## 2021-10-22 NOTE — Progress Notes (Signed)
? ? ? ?Established Patient Office Visit ? ? ? ? ?CC/Reason for Visit: "I am taking an antibiotic for my esophagus" ? ?HPI: Megan Good is a 71 y.o. female who is coming in today for the above mentioned reasons.  She had been having some dysphagia and dyspepsia symptoms.  She ended up having an EGD with Dr. Leonides Schanz recently and was diagnosed with Candida esophagitis.  She has been started on fluconazole.  She is just starting her second week of treatment.  Other than some stomach upset she is tolerating it well.  She has had some improvement of her symptoms although not complete.  She has also been having some right ear pain and is scheduled to see ENT this week.  She just wanted to update me on this. ? ?Past Medical/Surgical History: ?Past Medical History:  ?Diagnosis Date  ? Allergy   ? SEASONAL  ? Arthritis   ? LEFT SHOULDER,HAND-BILATERAL  ? Cataract   ? BILATERAL-REMOVED,2014  ? GERD (gastroesophageal reflux disease)   ? Headache   ? Hyperlipidemia   ? Osteoporosis   ? Thyroid disease   ? ? ?Past Surgical History:  ?Procedure Laterality Date  ? BREAST BIOPSY Left 2003  ? TONSILLECTOMY    ? AS A CHILD  ? ? ?Social History: ? reports that she has never smoked. She has never used smokeless tobacco. She reports that she does not drink alcohol and does not use drugs. ? ?Allergies: ?Allergies  ?Allergen Reactions  ? Penicillins Swelling and Rash  ?  Has patient had a PCN reaction causing immediate rash, facial/tongue/throat swelling, SOB or lightheadedness with hypotension: No ?Has patient had a PCN reaction causing severe rash involving mucus membranes or skin necrosis: No ?Has patient had a PCN reaction that required hospitalization: No ?Has patient had a PCN reaction occurring within the last 10 years: No ?If all of the above answers are "NO", then may proceed with Cephalosporin use. ? ?Burn-like rash  ? ? ?Family History:  ?Family History  ?Problem Relation Age of Onset  ? Healthy Mother 72  ? Other  Father   ?     MVA  ? Other Sister   ?     prediabetes  ? Stroke Maternal Grandmother   ? Diabetes Maternal Grandfather   ? Diabetes Maternal Uncle   ? Diabetes Maternal Uncle   ? Gestational diabetes Daughter   ? Colon cancer Neg Hx   ? Colon polyps Neg Hx   ? Esophageal cancer Neg Hx   ? Rectal cancer Neg Hx   ? Stomach cancer Neg Hx   ? ? ? ?Current Outpatient Medications:  ?  Acetaminophen (TYLENOL PO), Take by mouth. AS NEEDED, Disp: , Rfl:  ?  fluconazole (DIFLUCAN) 200 MG tablet, Take 2 tablets (400 mg total) by mouth daily for 1 day, THEN 1 tablet (200 mg total) daily for 13 days., Disp: 15 tablet, Rfl: 0 ?  Multiple Vitamins-Minerals (MULTIVITAMIN ADULTS PO), Take 1 tablet by mouth daily., Disp: , Rfl:  ?  pantoprazole (PROTONIX) 40 MG tablet, Take 1 tablet (40 mg total) by mouth 2 (two) times daily., Disp: 60 tablet, Rfl: 2 ?  rosuvastatin (CRESTOR) 10 MG tablet, Take 1 tablet (10 mg total) by mouth daily., Disp: 90 tablet, Rfl: 1 ?  sucralfate (CARAFATE) 1 GM/10ML suspension, Take 10 mLs (1 g total) by mouth 2 (two) times daily., Disp: 420 mL, Rfl: 1 ? ?Review of Systems:  ?Constitutional: Denies fever, chills, diaphoresis, appetite change  and fatigue.  ?HEENT: Denies photophobia, eye pain, redness,  congestion, sore throat, rhinorrhea, sneezing, mouth sores, trouble swallowing, neck pain, neck stiffness and tinnitus.   ?Respiratory: Denies SOB, DOE, cough, chest tightness,  and wheezing.   ?Cardiovascular: Denies chest pain, palpitations and leg swelling.  ?Gastrointestinal: Denies  diarrhea, constipation, blood in stool and abdominal distention.  ?Genitourinary: Denies dysuria, urgency, frequency, hematuria, flank pain and difficulty urinating.  ?Endocrine: Denies: hot or cold intolerance, sweats, changes in hair or nails, polyuria, polydipsia. ?Musculoskeletal: Denies myalgias, back pain, joint swelling, arthralgias and gait problem.  ?Skin: Denies pallor, rash and wound.  ?Neurological: Denies  dizziness, seizures, syncope, weakness, light-headedness, numbness and headaches.  ?Hematological: Denies adenopathy. Easy bruising, personal or family bleeding history  ?Psychiatric/Behavioral: Denies suicidal ideation, mood changes, confusion, nervousness, sleep disturbance and agitation ? ? ? ?Physical Exam: ?Vitals:  ? 10/22/21 0908  ?BP: 110/64  ?Pulse: 64  ?Temp: (!) 97.4 ?F (36.3 ?C)  ?TempSrc: Oral  ?SpO2: 96%  ?Weight: 110 lb 1.6 oz (49.9 kg)  ? ? ?Body mass index is 19.5 kg/m?. ? ? ?Constitutional: NAD, calm, comfortable ?Eyes: PERRL, lids and conjunctivae normal ?ENMT: Mucous membranes are moist.  ?Respiratory: clear to auscultation bilaterally, no wheezing, no crackles. Normal respiratory effort. No accessory muscle use.  ?Cardiovascular: Regular rate and rhythm, no murmurs / rubs / gallops. No extremity edema.  ?Psychiatric: Normal judgment and insight. Alert and oriented x 3. Normal mood.  ? ? ?Impression and Plan: ? ?Candida esophagitis (HCC) ? ?-Advised to complete out treatment with fluconazole.  She will follow-up with GI afterwards.  Look forward to hearing ENTs opinion this week in regards to chronic right ear pain and vertigo. ? ?Time spent:21 minutes reviewing chart, interviewing and examining patient and formulating plan of care. ? ? ? ? ? ?Chaya Jan, MD ?[facility] ? ? ?

## 2021-10-26 DIAGNOSIS — H6121 Impacted cerumen, right ear: Secondary | ICD-10-CM | POA: Diagnosis not present

## 2021-11-01 ENCOUNTER — Ambulatory Visit (INDEPENDENT_AMBULATORY_CARE_PROVIDER_SITE_OTHER): Payer: Medicare PPO

## 2021-11-01 DIAGNOSIS — K219 Gastro-esophageal reflux disease without esophagitis: Secondary | ICD-10-CM

## 2021-11-01 DIAGNOSIS — E782 Mixed hyperlipidemia: Secondary | ICD-10-CM

## 2021-11-01 DIAGNOSIS — M5410 Radiculopathy, site unspecified: Secondary | ICD-10-CM

## 2021-11-01 NOTE — Chronic Care Management (AMB) (Signed)
Chronic Care Management   CCM RN Visit Note  11/01/2021 Name: Megan Good MRN: [id number] DOB: [birth year]  Subjective: Megan Good is a 71 y.o. year old female who is a primary care patient of Philip AspenHernandez Acosta, Limmie PatriciaEstela Y, MD. The care management team was consulted for assistance with disease management and care coordination needs.    Engaged with patient by telephone for follow up visit in response to provider referral for case management and/or care coordination services.   Consent to Services:  The patient was given information about Chronic Care Management services, agreed to services, and gave verbal consent prior to initiation of services.  Please see initial visit note for detailed documentation.   Patient agreed to services and verbal consent obtained.   Assessment: Review of patient past medical history, allergies, medications, health status, including review of consultants reports, laboratory and other test data, was performed as part of comprehensive evaluation and provision of chronic care management services.   SDOH (Social Determinants of Health) assessments and interventions performed:    CCM Care Plan  Allergies  Allergen Reactions   Penicillins Swelling and Rash    Has patient had a PCN reaction causing immediate rash, facial/tongue/throat swelling, SOB or lightheadedness with hypotension: No Has patient had a PCN reaction causing severe rash involving mucus membranes or skin necrosis: No Has patient had a PCN reaction that required hospitalization: No Has patient had a PCN reaction occurring within the last 10 years: No If all of the above answers are "NO", then may proceed with Cephalosporin use.  Burn-like rash    Outpatient Encounter Medications as of 11/01/2021  Medication Sig   Acetaminophen (TYLENOL PO) Take by mouth. AS NEEDED   Multiple Vitamins-Minerals (MULTIVITAMIN ADULTS PO) Take 1 tablet by mouth daily.   pantoprazole (PROTONIX) 40  MG tablet Take 1 tablet (40 mg total) by mouth 2 (two) times daily.   rosuvastatin (CRESTOR) 10 MG tablet Take 1 tablet (10 mg total) by mouth daily.   sucralfate (CARAFATE) 1 GM/10ML suspension Take 10 mLs (1 g total) by mouth 2 (two) times daily.   No facility-administered encounter medications on file as of 11/01/2021.    Patient Active Problem List   Diagnosis Date Noted   Osteoporosis 01/09/2021   GERD (gastroesophageal reflux disease) 12/07/2019   Hyperlipidemia 12/07/2019   Subclinical hyperthyroidism 12/07/2019   Radicular neuropathy 09/02/2019    Conditions to be addressed/monitored:HLD, GERD, and radicular neuropathy  Care Plan : RN Care Manager Plan of Care  Updates made by [name], Goldy Calandra J, RN since 11/01/2021 12:00 AM     Problem: Chronic Disease Management and Care Coordination Needs (radicular neuropathy and HLD)   Priority: High     Long-Range Goal: Establish Plan of Care for Chronic Disease Management Needs (Radicular Neuropathy and HLD)   Start Date: 04/17/2021  Expected End Date: 11/01/2022  Recent Progress: On track  Priority: High  Note:   Current Barriers:  Chronic Disease Management support and education needs related to HLD, GERD, and Radicular neuropathy  No Advanced Directives in place States she had upper scope that showed she had a yeast infection in her esophagus.  States she took antibiotic to help it.  STAtes she is starting to eat better now.  States she her weight is down to 109 from 128 lbs. States that her neuropathy pain has improved and she has not been taking the gabapentin. States she is still walking her dog twice a day daily for about an hour total  each day.  States she sometimes gets numbness in her feet and legs that comes and goes.  States she is trying to eat healthy.  States her ear is feeling better since she saw the ENT and had the wax removed. States she needs to reschedule her colonscopy  RNCM Clinical Goal(s):  Patient will  verbalize understanding of plan for management of HLD and Radicular neuropathy as evidenced by voiced adherence to plan of care verbalize basic understanding of  HLD and Radicular neuropathy disease process and self health management plan as evidenced by voiced understanding and teach back take all medications exactly as prescribed and will call provider for medication related questions as evidenced by dispense report and pt verbalization attend all scheduled medical appointments: GI 11/13/21, Nutrition 11/29/21, cardiology 12/12/21 as evidenced by medical records demonstrate Ongoing adherence to prescribed treatment plan for HLD and Radicular neuropathy as evidenced by readings within limits and adherence to plan of care continue to work with RN Care Manager to address care management and care coordination needs related to  HLD and Radicular neuropathy as evidenced by adherence to CM Team Scheduled appointments through collaboration with RN Care manager, provider, and care team.   Interventions: 1:1 collaboration with primary care provider regarding development and update of comprehensive plan of care as evidenced by provider attestation and co-signature Inter-disciplinary care team collaboration (see longitudinal plan of care) Evaluation of current treatment plan related to  self management and patient's adherence to plan as established by provider  GERD  (Status:  New goal. and Goal on track:  Yes.)  Long Term Goal Evaluation of current treatment plan related to GERD,  GERD  self-management and patient's adherence to plan as established by provider. Discussed plans with patient for ongoing care management follow up and provided patient with direct contact information for care management team Evaluation of current treatment plan related to GERD and patient's adherence to plan as established by provider Advised patient to keep scheduled appointment with dietitian on 11/29/21 Provided education to patient  re: GERD Discussed plans with patient for ongoing care management follow up and provided patient with direct contact information for care management team Reinforced to try to eat more calories and protein to help her maintain her weight    Health Maintenance Interventions:  (Status:  Goal on track:  Yes.) Long Term Goal Advised patient to discuss  Colonoscopy    Pneumonia Vaccine COVID vaccination    Hepatitis C test  with primary care provider  Patient provided CDC guideline information about COVID vaccine booster, Pneumonia vaccin   , and Shingles vaccine    Notified provider of adult immunization status and request for consideration of order for/scheduling of Colonoscopy    Pneumonia Vaccine Hepatitis C test Provided education about reinforced to call and schedule colonoscopy    Hyperlipidemia Interventions:  (Status:  Goal on track:  Yes.) Long Term Goal Medication review performed; medication list updated in electronic medical record.  Counseled on importance of regular laboratory monitoring as prescribed Reviewed role and benefits of statin for ASCVD risk reduction Reviewed importance of limiting foods high in cholesterol Reviewed exercise goals and target of 150 minutes per week  Pain Interventions:  (Status:  Goal on track:  Yes.) Long Term Goal Pain assessment performed Medications reviewed Reviewed provider established plan for pain management Discussed importance of adherence to all scheduled medical appointments Counseled on the importance of reporting any/all new or changed pain symptoms or management strategies to pain management provider Advised patient  to report to care team affect of pain on daily activities Discussed use of relaxation techniques and/or diversional activities to assist with pain reduction (distraction, imagery, relaxation, massage, acupressure, TENS, heat, and cold application Reviewed with patient prescribed pharmacological and nonpharmacological pain  relief strategies Reviewed to consider doing water aerobics for exercise which is easier on her joints. Reinforced to pace activities    Patient Goals/Self-Care Activities: Take all medications as prescribed Attend all scheduled provider appointments Call pharmacy for medication refills 3-7 days in advance of running out of medications Perform all self care activities independently  Perform IADL's (shopping, preparing meals, housekeeping, managing finances) independently Call provider office for new concerns or questions  call for medicine refill 2 or 3 days before it runs out take all medications exactly as prescribed call doctor with any symptoms you believe are related to your medicine adhere to prescribed diet: low fat heart healthy develop an exercise routine learn how to meditate - learn relaxation techniques - practice relaxation or meditation daily - think of new ways to do favorite things - use distraction techniques  - Follow Up Plan:  Telephone follow up appointment with care management team member scheduled for:  01/31/22 The patient has been provided with contact information for the care management team and has been advised to call with any health related questions or concerns.       Plan:Telephone follow up appointment with care management team member scheduled for:  01/31/22 The patient has been provided with contact information for the care management team and has been advised to call with any health related questions or concerns.  Dudley Major RN, Maximiano Coss, CDE Care Management Coordinator [facility] [phone]

## 2021-11-01 NOTE — Patient Instructions (Addendum)
Visit Information  Thank you for taking time to visit with me today. Please don't hesitate to contact me if I can be of assistance to you before our next scheduled telephone appointment.  Following are the goals we discussed today:  Take all medications as prescribed Attend all scheduled provider appointments Call pharmacy for medication refills 3-7 days in advance of running out of medications Perform all self care activities independently  Perform IADL's (shopping, preparing meals, housekeeping, managing finances) independently Call provider office for new concerns or questions  call for medicine refill 2 or 3 days before it runs out take all medications exactly as prescribed call doctor with any symptoms you believe are related to your medicine adhere to prescribed diet: low fat heart healthy develop an exercise routine learn how to meditate - learn relaxation techniques - practice relaxation or meditation daily - think of new ways to do favorite things - use distraction techniques High-Protein and High-Calorie Diet Eating high-protein and high-calorie foods can help you to gain weight, heal after an injury, and recover after an illness or surgery. The specific amount of daily protein and calories you need depends on: Your body weight. The reason this diet is recommended for you. Generally, a high-protein, high-calorie diet involves: Eating 250-500 extra calories each day. Making sure that you get enough of your daily calories from protein. Ask your health care provider how many of your calories should come from protein. Talk with a health care provider or a dietitian about how much protein and how many calories you need each day. Follow the diet as directed by your health care provider. What are tips for following this plan?  Reading food labels Check the nutrition facts label for calories, grams of fat and protein. Items with more than 4 grams of protein are high-protein  foods. Preparing meals Add whole milk, half-and-half, or heavy cream to cereal, pudding, soup, or hot cocoa. Add whole milk to instant breakfast drinks. Add peanut butter to oatmeal or smoothies. Add powdered milk to baked goods, smoothies, or milkshakes. Add powdered milk, cream, or butter to mashed potatoes. Add cheese to cooked vegetables. Make whole-milk yogurt parfaits. Top them with granola, fruit, or nuts. Add cottage cheese to fruit. Add avocado, cheese, or both to sandwiches or salads. Add avocado to smoothies. Add meat, poultry, or seafood to rice, pasta, casseroles, salads, and soups. Use mayonnaise when making egg salad, chicken salad, or tuna salad. Use peanut butter as a dip for fruits and vegetables or as a topping for pretzels, celery, or crackers. Add beans to casseroles, dips, and spreads. Add pureed beans to sauces and soups. Replace calorie-free drinks with calorie-containing drinks, such as milk and fruit juice. Replace water with milk or heavy cream when making foods such as oatmeal, pudding, or cocoa. Add oil or butter to cooked vegetables and grains. Add cream cheese to sandwiches or as a topping on crackers and bread. Make cream-based pastas and soups. General information Ask your health care provider if you should take a nutritional supplement. Try to eat six small meals each day instead of three large meals. A general goal is to eat every 2 to 3 hours. Eat a balanced diet. In each meal, include one food that is high in protein and one food with fat in it. Keep nutritious snacks available, such as nuts, trail mixes, dried fruit, and yogurt. If you have kidney disease or diabetes, talk with your health care provider about how much protein is safe for you.  Too much protein may put extra stress on your kidneys. Drink your calories. Choose high-calorie drinks and have them after your meals. Consider setting a timer to remind you to eat. You will want to eat even if  you do not feel very hungry. What high-protein foods should I eat?  Vegetables Soybeans. Peas. Grains Quinoa. Bulgur wheat. Buckwheat. Meats and other proteins Beef, pork, and poultry. Fish and seafood. Eggs. Tofu. Textured vegetable protein (TVP). Peanut butter. Nuts and seeds. Dried beans. Protein powders. Hummus. Dairy Whole milk. Whole-milk yogurt. Powdered milk. Cheese. [employer]. Eggnog. Beverages High-protein supplement drinks. Soy milk. Other foods Protein bars. The items listed above may not be a complete list of foods and beverages you can eat and drink. Contact a dietitian for more information. What high-calorie foods should I eat? Fruits Dried fruit. Fruit leather. Canned fruit in syrup. Fruit juice. Avocado. Vegetables Vegetables cooked in oil or butter. Fried potatoes. Grains Pasta. Quick breads. Muffins. Pancakes. Ready-to-eat cereal. Meats and other proteins Peanut butter. Nuts and seeds. Dairy Heavy cream. Whipped cream. Cream cheese. Sour cream. Ice cream. Custard. Pudding. Whole milk dairy products. Beverages Meal-replacement beverages. Nutrition shakes. Fruit juice. Seasonings and condiments Salad dressing. Mayonnaise. Alfredo sauce. Fruit preserves or jelly. Honey. Syrup. Sweets and desserts Cake. Cookies. Pie. Pastries. Candy bars. Chocolate. Fats and oils Butter or margarine. Oil. Gravy. Other foods Meal-replacement bars. The items listed above may not be a complete list of foods and beverages you can eat and drink. Contact a dietitian for more information. Summary A high-protein, high-calorie diet can help you gain weight or heal faster after an injury, illness, or surgery. To increase your protein and calories, add ingredients such as whole milk, peanut butter, cheese, beans, meat, or seafood to meal items. To get enough extra calories each day, include high-calorie foods and beverages at each meal. Adding a high-calorie drink or shake can be an  easy way to help you get enough calories each day. Talk with your healthcare provider or dietitian about the best options for you. This information is not intended to replace advice given to you by your health care provider. Make sure you discuss any questions you have with your health care provider. Document Revised: 04/30/2020 Document Reviewed: 04/30/2020 Elsevier Patient Education  2023 [employer].   Our next appointment is by telephone on 01/31/22 at 3 PM  Please call the care guide team at [phone] if you need to cancel or reschedule your appointment.   If you are experiencing a Mental Health or Behavioral Health Crisis or need someone to talk to, please call the Suicide and Crisis Lifeline: 988 call the [place] National Suicide Prevention Lifeline: 260-817-2457 or TTY: 925-747-7837 TTY 226-716-4027) to talk to a trained counselor call 1-800-273-TALK (toll free, 24 hour hotline) go to [facility] [address], [place] [phone]) call 911   Patient verbalizes understanding of instructions and care plan provided today and agrees to view in MyChart. Active MyChart status and patient understanding of how to access instructions and care plan via MyChart confirmed with patient.     Dudley Major RN, Maximiano Coss, CDE Care Management Coordinator [facility] [phone]

## 2021-11-06 ENCOUNTER — Ambulatory Visit: Payer: Medicare PPO | Admitting: Internal Medicine

## 2021-11-07 DIAGNOSIS — E785 Hyperlipidemia, unspecified: Secondary | ICD-10-CM

## 2021-11-07 IMAGING — MG MM DIGITAL SCREENING BILAT W/ TOMO AND CAD
8 series · 8 of 24 positions shown · non-contrast
Comparison: Previous exam(s).

CLINICAL DATA: Screening.

EXAM:
DIGITAL SCREENING BILATERAL MAMMOGRAM WITH TOMOSYNTHESIS AND CAD
TECHNIQUE: Bilateral screening digital craniocaudal and mediolateral oblique
mammograms were obtained. Bilateral screening digital breast
tomosynthesis was performed. The images were evaluated with
computer-aided detection.

[R MLO synth-2D]
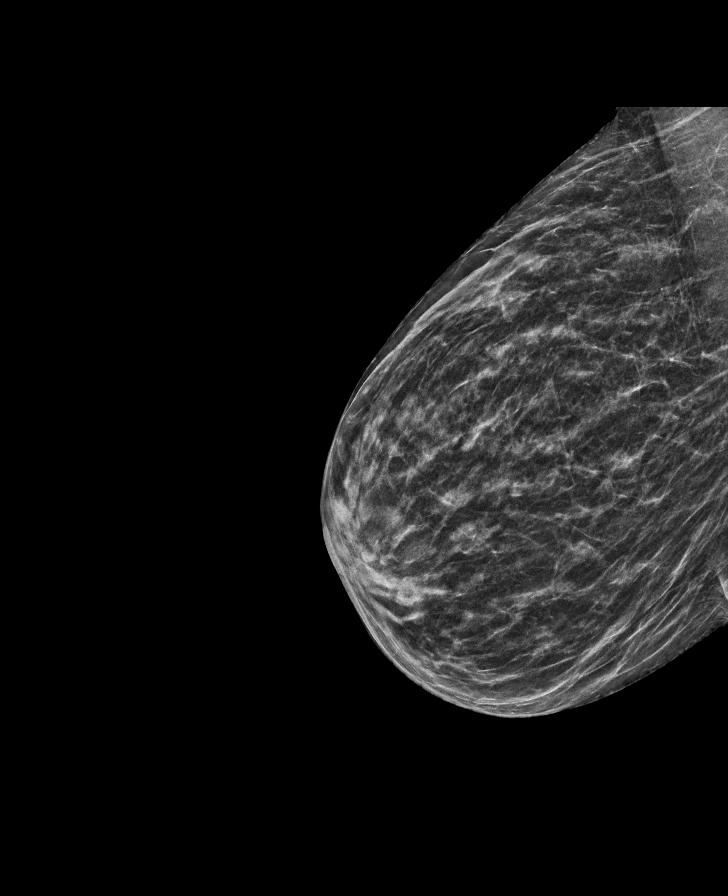

[L MLO synth-2D]
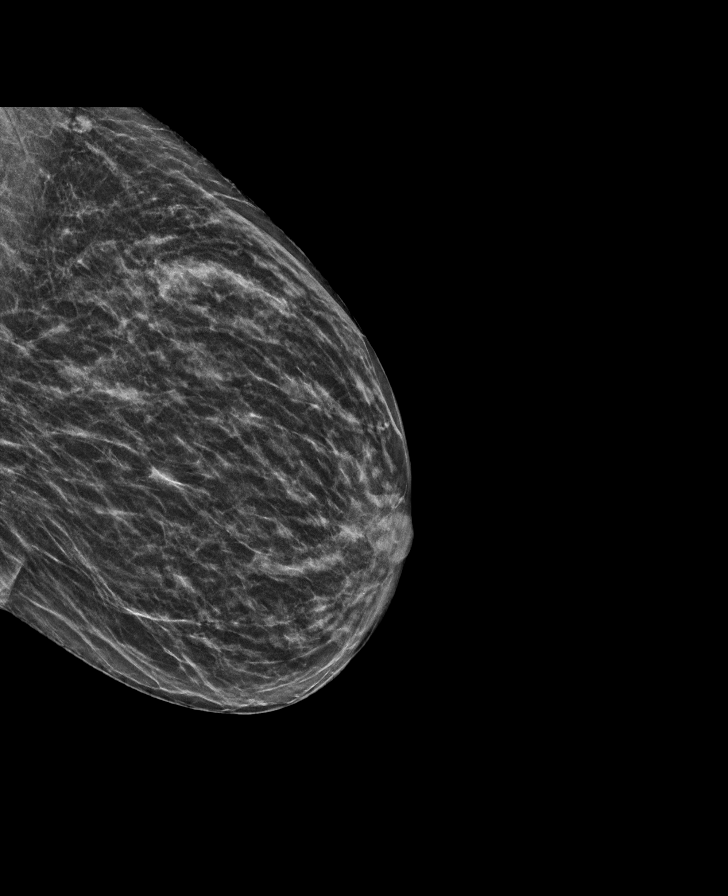

[L CC synth-2D]
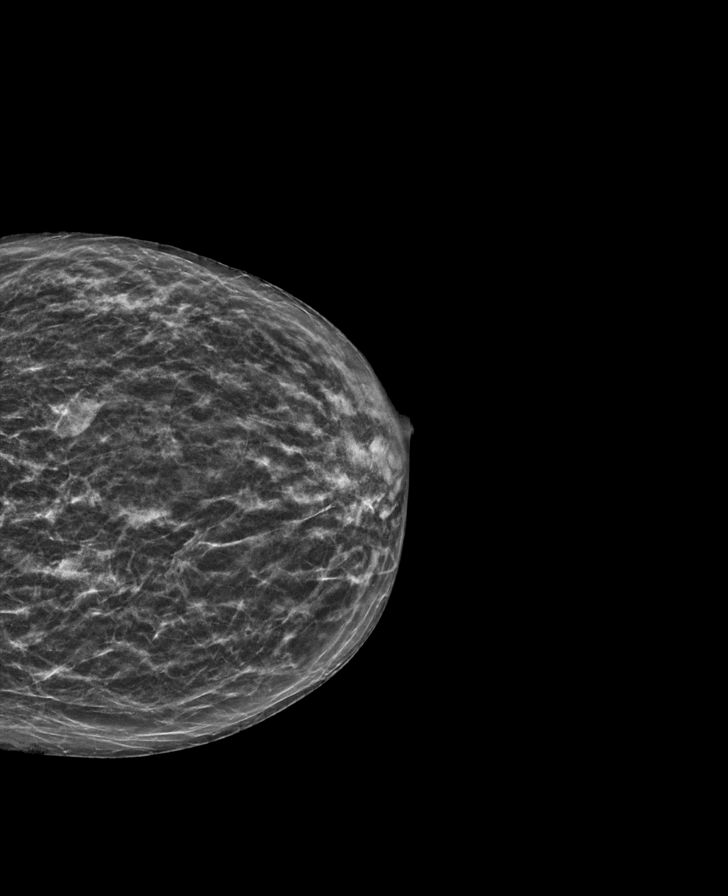

[R CC synth-2D]
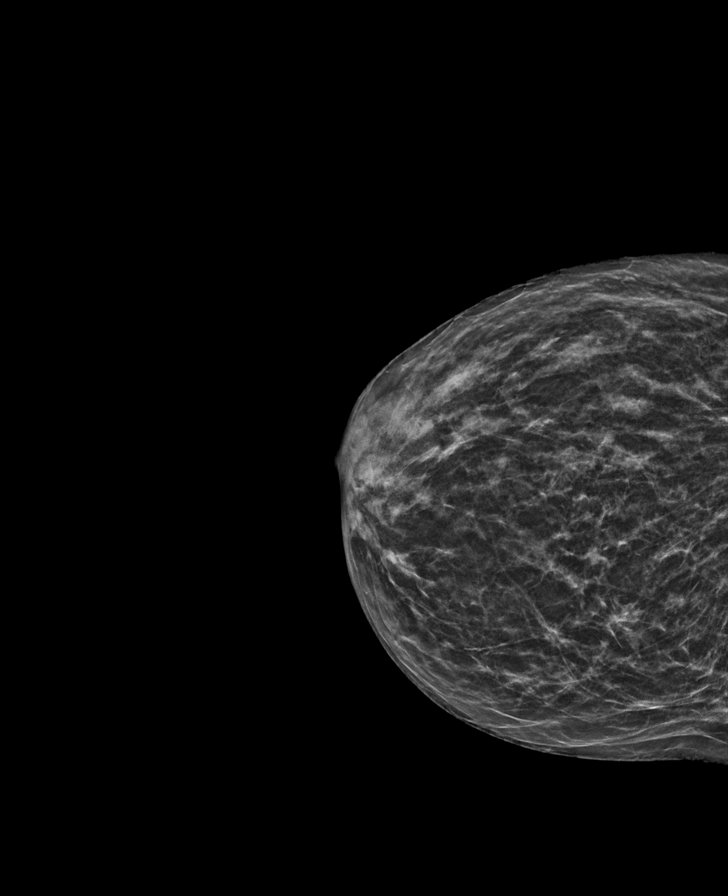

[L CC tomo · tomo slice 25/50.0]
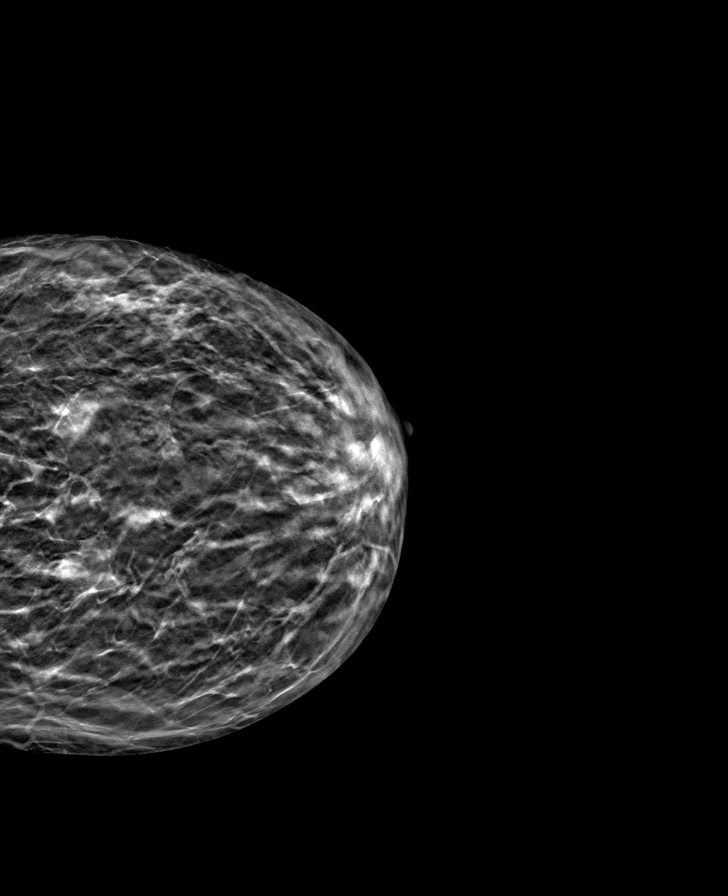

[R CC tomo · tomo slice 25/50.0]
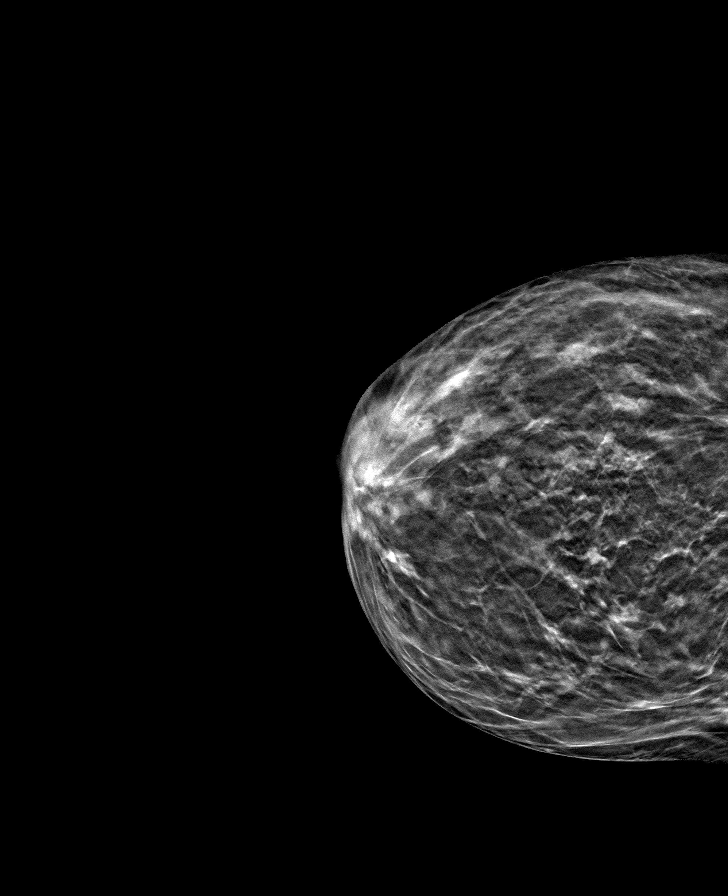

[R MLO tomo · tomo slice 25/50.0]
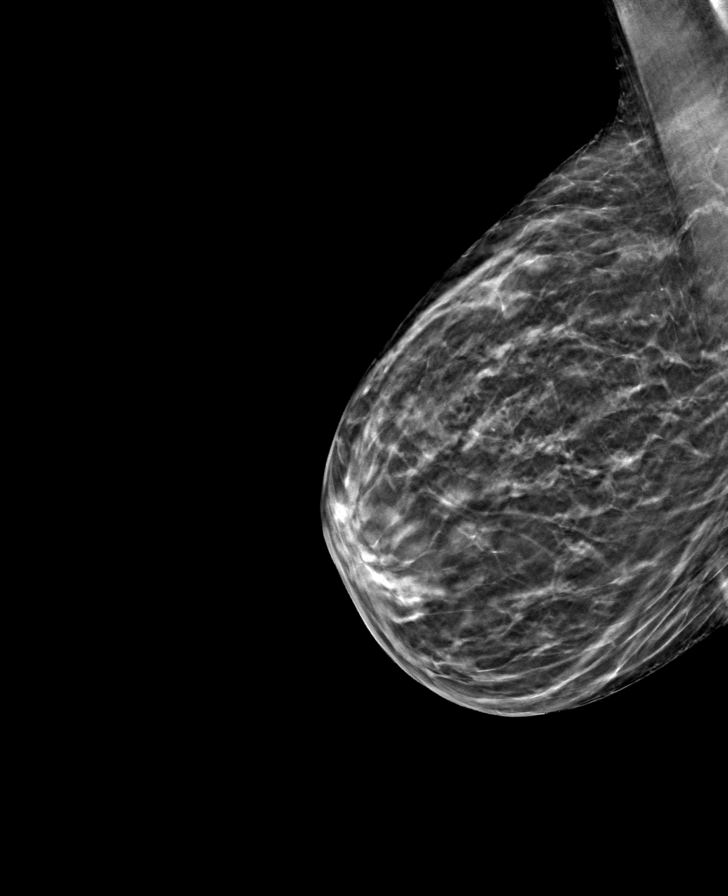

[L MLO tomo · tomo slice 24/47.0]
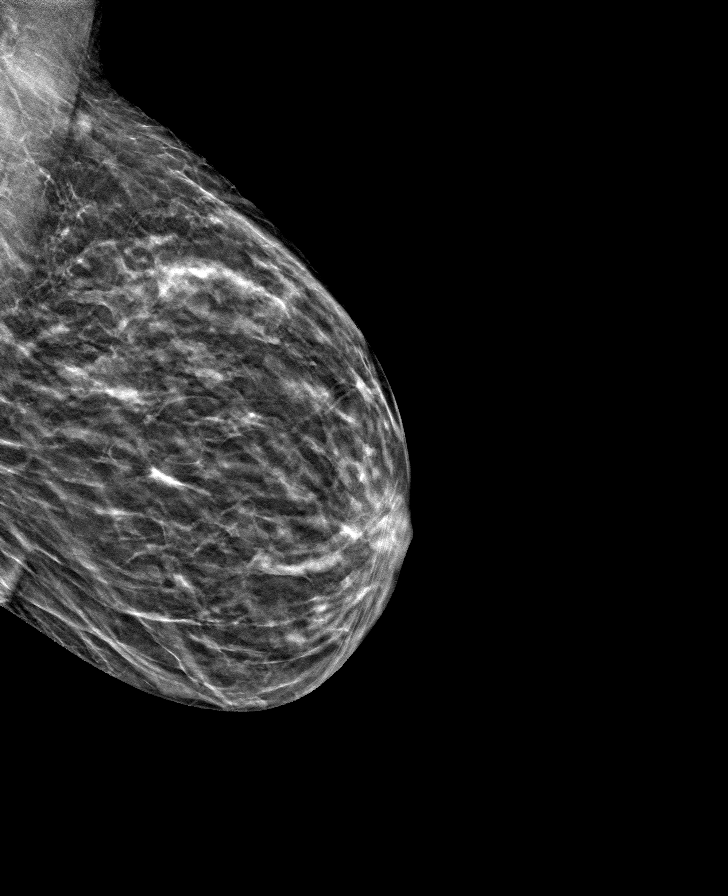

[8 of 24 positions shown; findings below may reference images not displayed]

ACR Breast Density Category b: There are scattered areas of
fibroglandular density.
FINDINGS: There are no findings suspicious for malignancy.
IMPRESSION: No mammographic evidence of malignancy. A result letter of this
screening mammogram will be mailed directly to the patient.

RECOMMENDATION:
Screening mammogram in one year. (Code:51-O-LD2)

BI-RADS CATEGORY  1: Negative.

## 2021-11-09 ENCOUNTER — Other Ambulatory Visit: Payer: Self-pay | Admitting: Internal Medicine

## 2021-11-09 DIAGNOSIS — K219 Gastro-esophageal reflux disease without esophagitis: Secondary | ICD-10-CM

## 2021-11-13 ENCOUNTER — Other Ambulatory Visit (INDEPENDENT_AMBULATORY_CARE_PROVIDER_SITE_OTHER): Payer: Medicare PPO

## 2021-11-13 ENCOUNTER — Ambulatory Visit (INDEPENDENT_AMBULATORY_CARE_PROVIDER_SITE_OTHER): Payer: Medicare PPO | Admitting: Internal Medicine

## 2021-11-13 ENCOUNTER — Encounter: Payer: Self-pay | Admitting: Internal Medicine

## 2021-11-13 VITALS — BP 120/50 | HR 68 | Ht 62.5 in | Wt 111.4 lb

## 2021-11-13 DIAGNOSIS — R634 Abnormal weight loss: Secondary | ICD-10-CM

## 2021-11-13 DIAGNOSIS — Z1211 Encounter for screening for malignant neoplasm of colon: Secondary | ICD-10-CM | POA: Diagnosis not present

## 2021-11-13 DIAGNOSIS — B379 Candidiasis, unspecified: Secondary | ICD-10-CM | POA: Diagnosis not present

## 2021-11-13 LAB — HEMOGLOBIN A1C: Hgb A1c MFr Bld: 5.9 % (ref 4.6–6.5)

## 2021-11-13 MED ORDER — NYSTATIN 100000 UNIT/ML MT SUSP: 5.0000 mL | Freq: Four times a day (QID) | OROMUCOSAL | 0 refills | Status: DC

## 2021-11-13 MED ORDER — FAMOTIDINE 20 MG PO TABS: 20.0000 mg | ORAL_TABLET | Freq: Two times a day (BID) | ORAL | 3 refills | Status: DC

## 2021-11-13 NOTE — Progress Notes (Signed)
Chief Complaint: Weight loss, dysphagia  HPI : 71 year old female with history of HFpEF, osteoporosis, hypothyroidism presents for follow-up of Candida esophagitis and weight loss  Interval History: Having some chest burning and regurgitation. Dysphagia has resolved after her course of fluconazole therapy. At night she feels some exacerbation of burning in her throat.  Her weight loss has stabilized, and she has gained a few pounds.  Patient has continued to take her Protonix and sucralfate therapy.  Wt Readings from Last 3 Encounters:  11/13/21 111 lb 6 oz (50.5 kg)  10/22/21 110 lb 1.6 oz (49.9 kg)  10/05/21 112 lb (50.8 kg)   Current Outpatient Medications  Medication Sig Dispense Refill   Acetaminophen (TYLENOL PO) Take by mouth. AS NEEDED     Multiple Vitamins-Minerals (MULTIVITAMIN ADULTS PO) Take 1 tablet by mouth daily.     pantoprazole (PROTONIX) 40 MG tablet TAKE ONE TABLET BY MOUTH TWICE A DAY 60 tablet 2   rosuvastatin (CRESTOR) 10 MG tablet Take 1 tablet (10 mg total) by mouth daily. 90 tablet 1   sucralfate (CARAFATE) 1 GM/10ML suspension Take 10 mLs (1 g total) by mouth 2 (two) times daily. 420 mL 1   No current facility-administered medications for this visit.   Physical Exam: Ht 5' 2.5" (1.588 m)   Wt 111 lb 6 oz (50.5 kg)   BMI 20.05 kg/m  Constitutional: Pleasant,well-developed, female in no acute distress. HEENT: Normocephalic and atraumatic. Conjunctivae are normal. No scleral icterus. Small amounts of white plaque in the back of her throat. Cardiovascular: Normal rate, regular rhythm.  Pulmonary/chest: Effort normal and breath sounds normal. No wheezing, rales or rhonchi. Abdominal: Soft, nondistended, nontender. Bowel sounds active throughout. There are no masses palpable. No hepatomegaly. Extremities: No edema Neurological: Alert and oriented to person place and time. Skin: Skin is warm and dry. No rashes noted. Psychiatric: Normal mood and affect.  Behavior is normal.  Labs 08/2021: CMP with mildly elevated Cr of 1.07. Lipase nml. CBC with mildly dec Hb of 11.3  Labs 09/2021: TTG IgA negative. IgA is normal.  CT A/P w/contrast 07/24/21: IMPRESSION: 1. No acute intra-abdominal process. 2. Moderate amount of retained stool in the colon suggesting constipation.  EGD 10/05/21: - White nummular lesions in esophageal mucosa. Biopsied. - Erythematous mucosa in the antrum. Biopsied. - Multiple gastric polyps. Biopsied. - Duodenal erosions without bleeding. Biopsied. Path: 1. Surgical [P], duodenal biopsy BENIGN DUODENAL MUCOSA WITH NO DIAGNOSTIC ABNORMALITY 2. Surgical [P], gastric biopsy REACTIVE GASTROPATHY WITH FOVEOLAR HYPERPLASIA AND MINIMAL CHRONIC GASTRITIS NEGATIVE FOR H. PYLORI, INTESTINAL METAPLASIA, DYSPLASIA AND CARCINOMA 3. Surgical [P], gastric polyp biopsy MINIMAL CHRONIC GASTRITIS NEGATIVE FOR CHANGES OF FUNDIC GLAND POLYP NEGATIVE FOR H. PYLORI, INTESTINAL METAPLASIA, DYSPLASIA AND CARCINOMA 4. Surgical [P], esophagus REACTIVE SQUAMOUS MUCOSA WITH FUNGAL HYPHAE AND YEAST CONSISTENT WITH CANDIDA NEGATIVE FOR GLANDULAR EPITHELIUM, EOSINOPHILS, DYSPLASIA AND CARCINOMA  ASSESSMENT AND PLAN: Weight loss, stable now Dysphagia Anemia Gastritis Candida infection Patient presents for follow-up of Candida esophagitis that was seen on her recent EGD.  She has had improvement in her dysphagia symptoms after the course of fluconazole therapy.  Patient does have some oral candidiasis on exam today.  Thus we will start her on nystatin swish and swallow to see if this helps resolve her symptoms.  If patient continues to have symptoms of chest burning and regurgitation, could consider another trial of fluconazole therapy.  Unclear exactly what caused the yeast infection, though could consider a course of antibiotic therapy that  which she received in 08/2021 as a possible contributor.  Will also rule out diabetes as a possibility.   Patient's weight loss has stabilized.  Will also go ahead and start her on famotidine to see if this helps with control of any potential acid reflux symptoms.  Patient is interested in colon cancer screening with a colonoscopy procedure. - Check hemoglobin A1C - Start nystatin swish and swallow QID for 14 days - Continue PPI BID - Start famotidine 20 mg BID - Colonoscopy [facility]  Eulah Pont, MD  I spent 43 minutes of time, including in depth chart review, independent review of results as outlined above, communicating results with the patient directly, face-to-face time with the patient, coordinating care, ordering studies and medications as appropriate, and documentation.

## 2021-11-13 NOTE — Patient Instructions (Signed)
Your provider has requested that you go to the basement level for lab work before leaving today. Press "B" on the elevator. The lab is located at the first door on the left as you exit the elevator.   We have sent the following medications to your pharmacy for you to pick up at your convenience:  Pepcid Nystatin   You have been scheduled for a colonoscopy. Please follow written instructions given to you at your visit today.  Please pick up your prep supplies at the pharmacy within the next 1-3 days. If you use inhalers (even only as needed), please bring them with you on the day of your procedure.   Due to recent changes in healthcare laws, you may see the results of your imaging and laboratory studies on MyChart before your provider has had a chance to review them.  We understand that in some cases there may be results that are confusing or concerning to you. Not all laboratory results come back in the same time frame and the provider may be waiting for multiple results in order to interpret others.  Please give [place] 48 hours in order for your provider to thoroughly review all the results before contacting the office for clarification of your results.    If you are age 17 or older, your body mass index should be between 23-30. Your Body mass index is 20.05 kg/m. If this is out of the aforementioned range listed, please consider follow up with your Primary Care Provider.  If you are age 14 or younger, your body mass index should be between 19-25. Your Body mass index is 20.05 kg/m. If this is out of the aformentioned range listed, please consider follow up with your Primary Care Provider.   ________________________________________________________  The [facility] providers would like to encourage you to use [facility] to communicate with providers for non-urgent requests or questions.  Due to long hold times on the telephone, sending your provider a message by [facility] may be a faster and more efficient  way to get a response.  Please allow 48 business hours for a response.  Please remember that this is for non-urgent requests.  _______________________________________________________   I appreciate the  opportunity to care for you  Thank You   Nicole Kindred Dorsey,MD

## 2021-11-15 ENCOUNTER — Other Ambulatory Visit: Payer: Self-pay | Admitting: Internal Medicine

## 2021-11-15 DIAGNOSIS — Z1231 Encounter for screening mammogram for malignant neoplasm of breast: Secondary | ICD-10-CM

## 2021-11-29 ENCOUNTER — Ambulatory Visit: Payer: Medicare PPO | Admitting: Dietician

## 2021-12-05 ENCOUNTER — Telehealth: Payer: Self-pay | Admitting: Internal Medicine

## 2021-12-05 NOTE — Telephone Encounter (Signed)
Called and spoke with patient regarding Dr. Derek Mound recommendations. Pt states that she finished the full course of Diflucan last month and is still concerned that she is having the muscle cramps. She is concerned that she is having some deficiency. I told pt that she should be drinking plenty of fluids with electrolytes. I told pt that she will need to contact her PCP. Pt has been advised that she will need to continue Pepcid and Pantoprazole BID before meals. Pt verbalized understanding and had no concerns at the end of the call.

## 2021-12-05 NOTE — Telephone Encounter (Signed)
Returned call to patient. Pt reports that she is having muscle cramps in both calves. She denies any swelling, redness, and the calves are not warm to the touch. Pt reports that she started noticing cramps in her calves while she was taking Diflucan. Pt reports that she is staying hydrated with "lots of water". Pt reports that the Nystatin solution causes her GERD to flare up causing arm pain. She reports that she thinks the yeast infection is better, but not completely resolved. Pt reports that she finished the Diflucan about 1.5 weeks ago. She is still taking her PPI and famotidine as prescribed. Please advise, thanks.

## 2021-12-05 NOTE — Telephone Encounter (Signed)
Inbound call from patient stating that she is on a couple of different medications and she is starting to have some issues with muscle spasms and pain in her arm. Patient is requesting a call back to discuss symptoms. Please advise.

## 2021-12-07 ENCOUNTER — Ambulatory Visit: Payer: Self-pay

## 2021-12-07 DIAGNOSIS — M5410 Radiculopathy, site unspecified: Secondary | ICD-10-CM

## 2021-12-07 DIAGNOSIS — E782 Mixed hyperlipidemia: Secondary | ICD-10-CM

## 2021-12-07 NOTE — Patient Instructions (Signed)
Visit Information Case closed goals met Thank you for allowing me to share the care management and care coordination services that are available to you as part of your health plan and services through your primary care provider and medical home. Please reach out to me at [phone] if the care management/care coordination team may be of assistance to you in the future.   Case closed goals met

## 2021-12-07 NOTE — Chronic Care Management (AMB) (Cosign Needed Addendum)
Chronic Care Management   CCM RN Visit Note  12/07/2021 Name: Megan Good MRN: [id number] DOB: 1951-01-04  Subjective: Megan Good is a 71 y.o. year old female who is a primary care patient of Isaac Bliss, Rayford Halsted, MD. The care management team was consulted for assistance with disease management and care coordination needs.    Engaged with patient by telephone for follow up visit in response to provider referral for case management and/or care coordination services.   Consent to Services:  The patient was given information about Chronic Care Management services, agreed to services, and gave verbal consent prior to initiation of services.  Please see initial visit note for detailed documentation.   Patient agreed to services and verbal consent obtained.   Assessment: Review of patient past medical history, allergies, medications, health status, including review of consultants reports, laboratory and other test data, was performed as part of comprehensive evaluation and provision of chronic care management services.   SDOH (Social Determinants of Health) assessments and interventions performed:    CCM Care Plan  Allergies  Allergen Reactions   Penicillins Swelling and Rash    Has patient had a PCN reaction causing immediate rash, facial/tongue/throat swelling, SOB or lightheadedness with hypotension: No Has patient had a PCN reaction causing severe rash involving mucus membranes or skin necrosis: No Has patient had a PCN reaction that required hospitalization: No Has patient had a PCN reaction occurring within the last 10 years: No If all of the above answers are "NO", then may proceed with Cephalosporin use.  Burn-like rash    Outpatient Encounter Medications as of 12/07/2021  Medication Sig   Acetaminophen (TYLENOL PO) Take by mouth. AS NEEDED   famotidine (PEPCID) 20 MG tablet Take 1 tablet (20 mg total) by mouth 2 (two) times daily.   Multiple  Vitamins-Minerals (MULTIVITAMIN ADULTS PO) Take 1 tablet by mouth daily.   nystatin (MYCOSTATIN) 100000 UNIT/ML suspension Take 5 mLs (500,000 Units total) by mouth 4 (four) times daily.   pantoprazole (PROTONIX) 40 MG tablet TAKE ONE TABLET BY MOUTH TWICE A DAY   rosuvastatin (CRESTOR) 10 MG tablet Take 1 tablet (10 mg total) by mouth daily.   sucralfate (CARAFATE) 1 GM/10ML suspension Take 10 mLs (1 g total) by mouth 2 (two) times daily.   No facility-administered encounter medications on file as of 12/07/2021.    Patient Active Problem List   Diagnosis Date Noted   Osteoporosis 01/09/2021   GERD (gastroesophageal reflux disease) 12/07/2019   Hyperlipidemia 12/07/2019   Subclinical hyperthyroidism 12/07/2019   Radicular neuropathy 09/02/2019    Conditions to be addressed/monitored:HLD  Care Plan : RN Care Manager Plan of Care  Updates made by Dimitri Ped, RN since 12/07/2021 12:00 AM  Completed 12/07/2021   Problem: Chronic Disease Management and Care Coordination Needs (radicular neuropathy and HLD) Resolved 12/07/2021  Priority: High     Long-Range Goal: Establish Plan of Care for Chronic Disease Management Needs (Radicular Neuropathy and HLD) Completed 12/07/2021  Start Date: 04/17/2021  Expected End Date: 11/01/2022  Recent Progress: On track  Priority: High  Note:   Case closed goals met Current Barriers:  Chronic Disease Management support and education needs related to HLD, GERD, and Radicular neuropathy  No Advanced Directives in place States she had upper scope that showed she had a yeast infection in her esophagus.  States she took antibiotic to help it.  STAtes she is starting to eat better now.  States she her weight is  down to 109 from 128 lbs. States that her neuropathy pain has improved and she has not been taking the gabapentin. States she is still walking her dog twice a day daily for about an hour total each day.  States she sometimes gets numbness in her  feet and legs that comes and goes.  States she is trying to eat healthy.  States her ear is feeling better since she saw the ENT and had the wax removed. States she needs to reschedule her colonscopy  RNCM Clinical Goal(s):  Patient will verbalize understanding of plan for management of HLD and Radicular neuropathy as evidenced by voiced adherence to plan of care verbalize basic understanding of  HLD and Radicular neuropathy disease process and self health management plan as evidenced by voiced understanding and teach back take all medications exactly as prescribed and will call provider for medication related questions as evidenced by dispense report and pt verbalization attend all scheduled medical appointments: GI 11/13/21, Nutrition 11/29/21, cardiology 12/12/21 as evidenced by medical records demonstrate Ongoing adherence to prescribed treatment plan for HLD and Radicular neuropathy as evidenced by readings within limits and adherence to plan of care continue to work with RN Care Manager to address care management and care coordination needs related to  HLD and Radicular neuropathy as evidenced by adherence to CM Team Scheduled appointments through collaboration with RN Care manager, provider, and care team.   Interventions: 1:1 collaboration with primary care provider regarding development and update of comprehensive plan of care as evidenced by provider attestation and co-signature Inter-disciplinary care team collaboration (see longitudinal plan of care) Evaluation of current treatment plan related to  self management and patient's adherence to plan as established by provider  GERD  (Status:  Goal Met.)  Long Term Goal Evaluation of current treatment plan related to GERD,  GERD  self-management and patient's adherence to plan as established by provider. Discussed plans with patient for ongoing care management follow up and provided patient with direct contact information for care management  team Evaluation of current treatment plan related to GERD and patient's adherence to plan as established by provider Advised patient to keep scheduled appointment with dietitian on 11/29/21 Provided education to patient re: GERD Discussed plans with patient for ongoing care management follow up and provided patient with direct contact information for care management team Reinforced to try to eat more calories and protein to help her maintain her weight    Health Maintenance Interventions:  (Status:  Goal Met.) Long Term Goal Advised patient to discuss  Colonoscopy    Pneumonia Vaccine COVID vaccination    Hepatitis C test  with primary care provider  Patient provided CDC guideline information about COVID vaccine booster, Pneumonia vaccin   , and Shingles vaccine    Notified provider of adult immunization status and request for consideration of order for/scheduling of Colonoscopy    Pneumonia Vaccine Hepatitis C test Provided education about reinforced to call and schedule colonoscopy    Hyperlipidemia Interventions:  (Status:  Goal Met.) Long Term Goal Medication review performed; medication list updated in electronic medical record.  Counseled on importance of regular laboratory monitoring as prescribed Reviewed role and benefits of statin for ASCVD risk reduction Reviewed importance of limiting foods high in cholesterol Reviewed exercise goals and target of 150 minutes per week  Pain Interventions:  (Status:  Goal Met.) Long Term Goal Pain assessment performed Medications reviewed Reviewed provider established plan for pain management Discussed importance of adherence to all scheduled medical  appointments Counseled on the importance of reporting any/all new or changed pain symptoms or management strategies to pain management provider Advised patient to report to care team affect of pain on daily activities Discussed use of relaxation techniques and/or diversional activities to  assist with pain reduction (distraction, imagery, relaxation, massage, acupressure, TENS, heat, and cold application Reviewed with patient prescribed pharmacological and nonpharmacological pain relief strategies Reviewed to consider doing water aerobics for exercise which is easier on her joints. Reinforced to pace activities    Patient Goals/Self-Care Activities: Take all medications as prescribed Attend all scheduled provider appointments Call pharmacy for medication refills 3-7 days in advance of running out of medications Perform all self care activities independently  Perform IADL's (shopping, preparing meals, housekeeping, managing finances) independently Call provider office for new concerns or questions  call for medicine refill 2 or 3 days before it runs out take all medications exactly as prescribed call doctor with any symptoms you believe are related to your medicine adhere to prescribed diet: low fat heart healthy develop an exercise routine learn how to meditate - learn relaxation techniques - practice relaxation or meditation daily - think of new ways to do favorite things - use distraction techniques  - Follow Up Plan:  The patient has been provided with contact information for the care management team and has been advised to call with any health related questions or concerns.  No further follow up required: Case closed goals met       Plan:The patient has been provided with contact information for the care management team and has been advised to call with any health related questions or concerns.  No further follow up required: Case closed goals met Peter Garter RN, [facility], CDE Care Management Coordinator [facility] [phone]

## 2021-12-12 ENCOUNTER — Ambulatory Visit: Payer: Medicare PPO | Admitting: Cardiology

## 2021-12-21 ENCOUNTER — Ambulatory Visit
Admission: RE | Admit: 2021-12-21 | Discharge: 2021-12-21 | Disposition: A | Discharge status: Home / Self Care | Payer: Medicare PPO | Source: Ambulatory Visit | Attending: Internal Medicine | Admitting: Internal Medicine

## 2021-12-21 DIAGNOSIS — Z1231 Encounter for screening mammogram for malignant neoplasm of breast: Secondary | ICD-10-CM | POA: Diagnosis not present

## 2021-12-27 ENCOUNTER — Ambulatory Visit: Payer: Medicare PPO | Admitting: Internal Medicine

## 2021-12-31 ENCOUNTER — Encounter: Payer: Self-pay | Admitting: Internal Medicine

## 2022-01-07 ENCOUNTER — Telehealth: Payer: Self-pay | Admitting: Internal Medicine

## 2022-01-07 NOTE — Telephone Encounter (Signed)
Spoke with the patient. She is feeling much better. No burning in her mouth, food is swallowed without any issues, no white places in her mouth and she rarely has any burning in her stomach. Sometimes in the mornings she may see some white in her mouth. She feels she is "about over the yeast."  She is wanting to be certain the colonoscopy will not stress her system causing the yeast infection to become an issue again. She describes a resurgence of the white places orally and her stomach hurting when she was stressed and saddened by a death in her family.  The colonoscopy is scheduled for 01/30/22. Do you recommend any change in plans?

## 2022-01-07 NOTE — Telephone Encounter (Signed)
Inbound call from patient stated that she had yeast infection in her throat a few weeks ago and is wanting to discuss her prep and her current medication she is on for the yeast. Patient is scheduled to have colonoscopy on 8/23. Please advise.

## 2022-01-08 NOTE — Telephone Encounter (Signed)
Called the patient back to advise. No answer. Left the information on her voicemail.

## 2022-01-14 ENCOUNTER — Ambulatory Visit: Payer: Medicare PPO | Admitting: Cardiology

## 2022-01-14 NOTE — Telephone Encounter (Signed)
Patient returned call, stated she was following up. Requesting a call back around 10 if possible. Please advise.

## 2022-01-14 NOTE — Telephone Encounter (Signed)
Spoke with the patient and advised. She would prefer to move the appointment for the colonoscopy into September. Appointment rescheduled to the same time on 03/06/22. Patient advised her instructions will be the same times as before and only the dates change.

## 2022-01-23 ENCOUNTER — Telehealth: Payer: Self-pay | Admitting: Internal Medicine

## 2022-01-23 NOTE — Telephone Encounter (Signed)
Neuropathy, warmness in legs, tingling in toes, nerve pain in foot. Declined OV would like to speak with care team.

## 2022-01-23 NOTE — Telephone Encounter (Signed)
Spoke with patient and an office visit has been scheduled.

## 2022-01-24 ENCOUNTER — Ambulatory Visit (INDEPENDENT_AMBULATORY_CARE_PROVIDER_SITE_OTHER): Payer: Medicare PPO | Admitting: Family Medicine

## 2022-01-24 ENCOUNTER — Encounter: Payer: Self-pay | Admitting: Family Medicine

## 2022-01-24 VITALS — BP 128/64 | HR 73 | Temp 98.5°F | Wt 115.8 lb

## 2022-01-24 DIAGNOSIS — R202 Paresthesia of skin: Secondary | ICD-10-CM | POA: Diagnosis not present

## 2022-01-24 DIAGNOSIS — R252 Cramp and spasm: Secondary | ICD-10-CM

## 2022-01-24 LAB — CBC WITH DIFFERENTIAL/PLATELET
Basophils Absolute: 0 10*3/uL (ref 0.0–0.1)
Basophils Relative: 0.7 % (ref 0.0–3.0)
Eosinophils Absolute: 0 10*3/uL (ref 0.0–0.7)
Eosinophils Relative: 0.4 % (ref 0.0–5.0)
HCT: 37.8 % (ref 36.0–46.0)
Hemoglobin: 12.2 g/dL (ref 12.0–15.0)
Lymphocytes Relative: 18.2 % (ref 12.0–46.0)
Lymphs Abs: 1.1 10*3/uL (ref 0.7–4.0)
MCHC: 32.4 g/dL (ref 30.0–36.0)
MCV: 86.6 fl (ref 78.0–100.0)
Monocytes Absolute: 0.5 10*3/uL (ref 0.1–1.0)
Monocytes Relative: 8.8 % (ref 3.0–12.0)
Neutro Abs: 4.3 10*3/uL (ref 1.4–7.7)
Neutrophils Relative %: 71.9 % (ref 43.0–77.0)
Platelets: 145 10*3/uL — ABNORMAL LOW (ref 150.0–400.0)
RBC: 4.36 Mil/uL (ref 3.87–5.11)
RDW: 13.8 % (ref 11.5–15.5)
WBC: 5.9 10*3/uL (ref 4.0–10.5)

## 2022-01-24 LAB — BASIC METABOLIC PANEL
BUN: 11 mg/dL (ref 6–23)
CO2: 28 mEq/L (ref 19–32)
Calcium: 9.1 mg/dL (ref 8.4–10.5)
Chloride: 103 mEq/L (ref 96–112)
Creatinine, Ser: 0.8 mg/dL (ref 0.40–1.20)
GFR: 73.6 mL/min (ref >60.00)
Glucose, Bld: 100 mg/dL — ABNORMAL HIGH (ref 70–99)
Potassium: 4.1 mEq/L (ref 3.5–5.1)
Sodium: 138 mEq/L (ref 135–145)

## 2022-01-24 LAB — FOLATE: Folate: 14.4 ng/mL (ref >5.9)

## 2022-01-24 LAB — VITAMIN B12: Vitamin B-12: 891 pg/mL (ref 211–911)

## 2022-01-24 LAB — TSH: TSH: 0.43 u[IU]/mL (ref 0.35–5.50)

## 2022-01-24 LAB — MAGNESIUM: Magnesium: 2.3 mg/dL (ref 1.5–2.5)

## 2022-01-24 LAB — T4, FREE: Free T4: 1.11 ng/dL (ref 0.60–1.60)

## 2022-01-24 NOTE — Progress Notes (Signed)
Subjective:    Patient ID: Megan Good, female    DOB: [birth year], 71 y.o.   MRN: [id number]  Chief Complaint  Patient presents with   Tingling    Feels like something warm running through her left calf. Also feels a tingling in calf and toes. Started after she lost weight. Bathing in epsom salt helps.   Acute visit:  HPI Patient was seen today for ongoing concern, followed by Dr. Ardyth Harps.   Endorses chronic tingling in toes but now with tingling in L lateral calf.  Also notes cramping in left calf and sensation of "something warm running on  leg" and now burning.  States cramping and sensation occur if she walks greater than 1 mile or with sitting in certain positions.  Denies pain, edema, erythema in LEs.  Typically takes Tylenol and does Epsom salt bath for chronic paresthesias.  Patient inquires if she can use topical various topical creams for symptoms.  Has a list including Nurive, capsaicin, lidocaine.  Patient inquires if there is something swollen in her throat.  Endorses recent history of thrush and severe acid reflux.  States acid reflux is improved.  Past Medical History:  Diagnosis Date   Allergy    SEASONAL   Arthritis    LEFT SHOULDER,HAND-BILATERAL   Cataract    BILATERAL-REMOVED,2014   GERD (gastroesophageal reflux disease)    Headache    Hyperlipidemia    Osteoporosis    Thyroid disease     Allergies  Allergen Reactions   Penicillins Swelling and Rash    Has patient had a PCN reaction causing immediate rash, facial/tongue/throat swelling, SOB or lightheadedness with hypotension: No Has patient had a PCN reaction causing severe rash involving mucus membranes or skin necrosis: No Has patient had a PCN reaction that required hospitalization: No Has patient had a PCN reaction occurring within the last 10 years: No If all of the above answers are "NO", then may proceed with Cephalosporin use.  Burn-like rash    ROS General: Denies fever, chills, night  sweats, changes in weight, changes in appetite  + changes in weight HEENT: Denies headaches, ear pain, changes in vision, rhinorrhea, sore throat CV: Denies CP, palpitations, SOB, orthopnea Pulm: Denies SOB, cough, wheezing GI: Denies abdominal pain, nausea, vomiting, diarrhea, constipation  + GERD GU: Denies dysuria, hematuria, frequency, vaginal discharge Msk: Denies joint pains  + left calf cramping Neuro: Denies weakness, numbness, tingling + tingling in left lateral calf and toes Skin: Denies rashes, bruising Psych: Denies depression, anxiety, hallucinations     Objective:    Blood pressure 128/64, pulse 73, temperature 98.5 F (36.9 C), temperature source Oral, weight 115 lb 12.8 oz (52.5 kg), SpO2 97 %.  Gen. Pleasant, thin in appearance, in no distress, normal affect   HEENT: [place]/AT, face symmetric, conjunctiva clear, no scleral icterus, PERRLA, EOMI, nares patent without drainage Lungs: no accessory muscle use Cardiovascular: RRR, no m/r/g, no peripheral edema.  Negative Homans' sign.  No TTP of bilateral calves. Musculoskeletal: TTP of the lateral Left, no erythema, edema.  No deformities, no cyanosis or clubbing, normal tone Neuro:  A&Ox3, CN II-XII intact, normal gait.  Normal sensation in bilateral LEs. Skin:  Warm, no lesions/ rash.  No spider or varicose veins noted.   Wt Readings from Last 3 Encounters:  11/13/21 111 lb 6 oz (50.5 kg)  10/22/21 110 lb 1.6 oz (49.9 kg)  10/05/21 112 lb (50.8 kg)    Lab Results  Component Value Date  WBC 7.0 08/24/2021   HGB 11.3 (L) 08/24/2021   HCT 33.4 (L) 08/24/2021   PLT 182 08/24/2021   GLUCOSE 100 (H) 08/24/2021   CHOL 161 10/10/2020   TRIG 66.0 10/10/2020   HDL 54.90 10/10/2020   LDLCALC 93 10/10/2020   ALT 17 08/24/2021   AST 23 08/24/2021   NA 135 08/24/2021   K 3.8 08/24/2021   CL 101 08/24/2021   CREATININE 1.07 (H) 08/24/2021   BUN 13 08/24/2021   CO2 24 08/24/2021   TSH 0.354 08/24/2021   HGBA1C 5.9  11/13/2021    Assessment/Plan:  Paresthesia -Discussed various causes including compression of a superficial nerve, history of radicular neuropathy/possible progression of neuropathy, vitamin or electrolyte deficiency/abnormality.  Less likely 2/2 venous insufficiency or claudication as more numbness rather than pain. -Hemoglobin A1c 5.9% on 11/13/2021 -Continue supportive care -Given handout  - Plan: Vitamin B12, Folate, CBC with Differential/Platelet, Basic metabolic panel, Magnesium, TSH, T4, Free  Leg cramp -Discussed the importance of hydration and stretching - Plan: Basic metabolic panel, Magnesium, TSH, T4, Free  F/u with PCP in the next few weeks.  Abbe Amsterdam, MD

## 2022-01-30 ENCOUNTER — Encounter: Payer: Medicare PPO | Admitting: Internal Medicine

## 2022-01-31 ENCOUNTER — Telehealth: Payer: Medicare PPO

## 2022-02-04 ENCOUNTER — Other Ambulatory Visit: Payer: Self-pay | Admitting: Internal Medicine

## 2022-02-04 DIAGNOSIS — K219 Gastro-esophageal reflux disease without esophagitis: Secondary | ICD-10-CM

## 2022-02-27 ENCOUNTER — Ambulatory Visit: Payer: Medicare PPO | Admitting: Cardiology

## 2022-02-27 ENCOUNTER — Encounter: Payer: Self-pay | Admitting: Internal Medicine

## 2022-03-06 ENCOUNTER — Encounter: Payer: Medicare PPO | Admitting: Internal Medicine

## 2022-03-09 ENCOUNTER — Other Ambulatory Visit: Payer: Self-pay | Admitting: Internal Medicine

## 2022-03-12 ENCOUNTER — Encounter: Payer: Self-pay | Admitting: Internal Medicine

## 2022-03-12 ENCOUNTER — Ambulatory Visit (INDEPENDENT_AMBULATORY_CARE_PROVIDER_SITE_OTHER): Payer: Medicare PPO | Admitting: Internal Medicine

## 2022-03-12 VITALS — BP 108/62 | HR 76 | Temp 97.7°F | Ht 62.5 in | Wt 115.2 lb

## 2022-03-12 DIAGNOSIS — Z23 Encounter for immunization: Secondary | ICD-10-CM

## 2022-03-12 DIAGNOSIS — Z0000 Encounter for general adult medical examination without abnormal findings: Secondary | ICD-10-CM | POA: Diagnosis not present

## 2022-03-12 DIAGNOSIS — E0590 Thyrotoxicosis, unspecified without thyrotoxic crisis or storm: Secondary | ICD-10-CM | POA: Diagnosis not present

## 2022-03-12 DIAGNOSIS — Z124 Encounter for screening for malignant neoplasm of cervix: Secondary | ICD-10-CM

## 2022-03-12 DIAGNOSIS — E782 Mixed hyperlipidemia: Secondary | ICD-10-CM | POA: Diagnosis not present

## 2022-03-12 DIAGNOSIS — M810 Age-related osteoporosis without current pathological fracture: Secondary | ICD-10-CM

## 2022-03-12 NOTE — Progress Notes (Signed)
Established Patient Office Visit     CC/Reason for Visit: Annual preventive exam and subsequent Medicare wellness visit  HPI: Megan Good is a 71 y.o. female who is coming in today for the above mentioned reasons. Past Medical History is significant for: Hyperlipidemia, subclinical hyperthyroidism and osteoporosis.  She has been doing well.  She has some candidal esophagitis earlier this year that has improved with treatment.  She is followed by GI.  She is overdue for an eye exam, has routine dental care, no perceived hearing difficulty, she walks around 45 minutes on a daily basis.   Past Medical/Surgical History: Past Medical History:  Diagnosis Date   Allergy    SEASONAL   Arthritis    LEFT SHOULDER,HAND-BILATERAL   Cataract    BILATERAL-REMOVED,2014   GERD (gastroesophageal reflux disease)    Headache    Hyperlipidemia    Osteoporosis    Thyroid disease     Past Surgical History:  Procedure Laterality Date   BREAST BIOPSY Left 2003   TONSILLECTOMY     AS A CHILD    Social History:  reports that she has never smoked. She has never used smokeless tobacco. She reports that she does not drink alcohol and does not use drugs.  Allergies: Allergies  Allergen Reactions   Penicillins Swelling and Rash    Has patient had a PCN reaction causing immediate rash, facial/tongue/throat swelling, SOB or lightheadedness with hypotension: No Has patient had a PCN reaction causing severe rash involving mucus membranes or skin necrosis: No Has patient had a PCN reaction that required hospitalization: No Has patient had a PCN reaction occurring within the last 10 years: No If all of the above answers are "NO", then may proceed with Cephalosporin use.  Burn-like rash    Family History:  Family History  Problem Relation Age of Onset   Healthy Mother 1   Other Father        MVA   Other Sister        prediabetes   Stroke Maternal Grandmother    Diabetes Maternal  Grandfather    Diabetes Maternal Uncle    Diabetes Maternal Uncle    Gestational diabetes Daughter    Colon cancer Neg Hx    Colon polyps Neg Hx    Esophageal cancer Neg Hx    Rectal cancer Neg Hx    Stomach cancer Neg Hx      Current Outpatient Medications:    Acetaminophen (TYLENOL PO), Take by mouth. AS NEEDED, Disp: , Rfl:    famotidine (PEPCID) 20 MG tablet, TAKE 1 TABLET BY MOUTH TWICE A DAY, Disp: 60 tablet, Rfl: 3   Multiple Vitamins-Minerals (MULTIVITAMIN ADULTS PO), Take 1 tablet by mouth daily., Disp: , Rfl:    nystatin (MYCOSTATIN) 100000 UNIT/ML suspension, Take 5 mLs (500,000 Units total) by mouth 4 (four) times daily., Disp: 280 mL, Rfl: 0   pantoprazole (PROTONIX) 40 MG tablet, TAKE 1 TABLET BY MOUTH TWICE A DAY, Disp: 180 tablet, Rfl: 1   rosuvastatin (CRESTOR) 10 MG tablet, Take 1 tablet (10 mg total) by mouth daily., Disp: 90 tablet, Rfl: 1   sucralfate (CARAFATE) 1 GM/10ML suspension, Take 10 mLs (1 g total) by mouth 2 (two) times daily., Disp: 420 mL, Rfl: 1  Review of Systems:  Constitutional: Denies fever, chills, diaphoresis, appetite change and fatigue.  HEENT: Denies photophobia, eye pain, redness, hearing loss, ear pain, congestion, sore throat, rhinorrhea, sneezing, mouth sores, trouble swallowing, neck pain, neck  stiffness and tinnitus.   Respiratory: Denies SOB, DOE, cough, chest tightness,  and wheezing.   Cardiovascular: Denies chest pain, palpitations and leg swelling.  Gastrointestinal: Denies nausea, vomiting, abdominal pain, diarrhea, constipation, blood in stool and abdominal distention.  Genitourinary: Denies dysuria, urgency, frequency, hematuria, flank pain and difficulty urinating.  Endocrine: Denies: hot or cold intolerance, sweats, changes in hair or nails, polyuria, polydipsia. Musculoskeletal: Denies myalgias, back pain, joint swelling, arthralgias and gait problem.  Skin: Denies pallor, rash and wound.  Neurological: Denies dizziness,  seizures, syncope, weakness, light-headedness, numbness and headaches.  Hematological: Denies adenopathy. Easy bruising, personal or family bleeding history  Psychiatric/Behavioral: Denies suicidal ideation, mood changes, confusion, nervousness, sleep disturbance and agitation    Physical Exam: Vitals:   03/12/22 1120  BP: 108/62  Pulse: 76  Temp: 97.7 F (36.5 C)  TempSrc: Oral  SpO2: 98%  Weight: 115 lb 3.2 oz (52.3 kg)  Height: 5' 2.5" (1.588 m)    Body mass index is 20.73 kg/m.   Constitutional: NAD, calm, comfortable Eyes: PERRL, lids and conjunctivae normal ENMT: Mucous membranes are moist. Posterior pharynx clear of any exudate or lesions. Normal dentition. Tympanic membrane is pearly white, no erythema or bulging. Neck: normal, supple, no masses, no thyromegaly Respiratory: clear to auscultation bilaterally, no wheezing, no crackles. Normal respiratory effort. No accessory muscle use.  Cardiovascular: Regular rate and rhythm, no murmurs / rubs / gallops. No extremity edema. 2+ pedal pulses. No carotid bruits.  Abdomen: no tenderness, no masses palpated. No hepatosplenomegaly. Bowel sounds positive.  Musculoskeletal: no clubbing / cyanosis. No joint deformity upper and lower extremities. Good ROM, no contractures. Normal muscle tone.  Skin: no rashes, lesions, ulcers. No induration Neurologic: CN 2-12 grossly intact. Sensation intact, DTR normal. Strength 5/5 in all 4.  Psychiatric: Normal judgment and insight. Alert and oriented x 3. Normal mood.   Subsequent Medicare wellness visit   1. Risk factors, based on past  M,S,F -cardiovascular disease risk factors include age and hyperlipidemia   2.  Physical activities: Walks daily   3.  Depression/mood: Well, not depressed   4.  Hearing: No perceived deficits   5.  ADL's: Independent in all ADLs   6.  Fall risk: Low fall risk   7.  Home safety: No problems identified   8.  Height weight, and visual acuity:  height and weight as above, vision:  20/25 with both eyes   9.  Counseling: Advised to update immunizations   10. Lab orders based on risk factors: Laboratory update will be reviewed   11. Referral : GYN   12. Care plan: Follow-up with me in 6 months   13. Cognitive assessment: No cognitive impairment   14. Screening: Patient provided with a written and personalized 5-10 year screening schedule in the AVS. yes   15. Provider List Update: PCP, GI  16. Advance Directives: Full code   17. Opioids: Patient is not on any opioid prescriptions and has no risk factors for a substance use disorder.   Flowsheet Row Office Visit from 03/12/2022 in [facility] [place]  PHQ-9 Total Score 1          08/13/2021    2:05 PM 08/24/2021    5:31 PM 08/30/2021    2:10 PM 01/24/2022    1:16 PM 03/12/2022   11:34 AM  Fall Risk  Falls in the past year? 0  0 0 0  Was there an injury with Fall? 0  0 0 0  Fall Risk Category Calculator 0  0 0 0  Fall Risk Category Low  Low Low Low  Patient Fall Risk Level  Moderate fall risk Low fall risk Low fall risk Low fall risk  Patient at Risk for Falls Due to   No Fall Risks No Fall Risks No Fall Risks  Fall risk Follow up Falls evaluation completed  Falls evaluation completed Falls evaluation completed Falls evaluation completed     Impression and Plan:  Encounter for preventive health examination  Needs flu shot - Plan: Flu Vaccine QUAD High Dose(Fluad)  Age-related osteoporosis without current pathological fracture - Plan: VITAMIN D 25 Hydroxy (Vit-D Deficiency, Fractures), Vitamin B12  Mixed hyperlipidemia - Plan: CBC with Differential/Platelet, Comprehensive metabolic panel, Lipid panel  Subclinical hyperthyroidism - Plan: TSH, T4, free, T3, free  Cervical cancer screening - Plan: Ambulatory referral to Gynecology    -Recommend routine eye and dental care. -Immunizations: Flu vaccine in office today, she will get COVID at  pharmacy, other immunizations are up-to-date -Healthy lifestyle discussed in detail. -Labs to be updated today. -Colon cancer screening: Scheduled for November -Breast cancer screening: 12/2021 -Cervical cancer screening: Overdue, requests GYN referral -Lung cancer screening: Not applicable -Prostate cancer screening: Not applicable -DEXA: 12/2020     Chaya Jan, MD [facility]

## 2022-03-15 ENCOUNTER — Other Ambulatory Visit (INDEPENDENT_AMBULATORY_CARE_PROVIDER_SITE_OTHER): Payer: Medicare PPO

## 2022-03-15 DIAGNOSIS — E782 Mixed hyperlipidemia: Secondary | ICD-10-CM | POA: Diagnosis not present

## 2022-03-15 DIAGNOSIS — E0590 Thyrotoxicosis, unspecified without thyrotoxic crisis or storm: Secondary | ICD-10-CM | POA: Diagnosis not present

## 2022-03-15 DIAGNOSIS — M810 Age-related osteoporosis without current pathological fracture: Secondary | ICD-10-CM

## 2022-03-15 LAB — CBC WITH DIFFERENTIAL/PLATELET
Basophils Absolute: 0 10*3/uL (ref 0.0–0.1)
Basophils Relative: 0.5 % (ref 0.0–3.0)
Eosinophils Absolute: 0 10*3/uL (ref 0.0–0.7)
Eosinophils Relative: 0.8 % (ref 0.0–5.0)
HCT: 36.3 % (ref 36.0–46.0)
Hemoglobin: 11.8 g/dL — ABNORMAL LOW (ref 12.0–15.0)
Lymphocytes Relative: 29.2 % (ref 12.0–46.0)
Lymphs Abs: 1.8 10*3/uL (ref 0.7–4.0)
MCHC: 32.6 g/dL (ref 30.0–36.0)
MCV: 86.9 fl (ref 78.0–100.0)
Monocytes Absolute: 0.6 10*3/uL (ref 0.1–1.0)
Monocytes Relative: 9.5 % (ref 3.0–12.0)
Neutro Abs: 3.7 10*3/uL (ref 1.4–7.7)
Neutrophils Relative %: 60 % (ref 43.0–77.0)
Platelets: 152 10*3/uL (ref 150.0–400.0)
RBC: 4.17 Mil/uL (ref 3.87–5.11)
RDW: 13.8 % (ref 11.5–15.5)
WBC: 6.1 10*3/uL (ref 4.0–10.5)

## 2022-03-15 LAB — VITAMIN B12: Vitamin B-12: 1388 pg/mL — ABNORMAL HIGH (ref 211–911)

## 2022-03-15 LAB — COMPREHENSIVE METABOLIC PANEL
ALT: 29 U/L (ref 0–35)
AST: 31 U/L (ref 0–37)
Albumin: 4.2 g/dL (ref 3.5–5.2)
Alkaline Phosphatase: 79 U/L (ref 39–117)
BUN: 18 mg/dL (ref 6–23)
CO2: 26 mEq/L (ref 19–32)
Calcium: 8.8 mg/dL (ref 8.4–10.5)
Chloride: 103 mEq/L (ref 96–112)
Creatinine, Ser: 1 mg/dL (ref 0.40–1.20)
GFR: 56.26 mL/min — ABNORMAL LOW (ref >60.00)
Glucose, Bld: 106 mg/dL — ABNORMAL HIGH (ref 70–99)
Potassium: 4.1 mEq/L (ref 3.5–5.1)
Sodium: 137 mEq/L (ref 135–145)
Total Bilirubin: 0.3 mg/dL (ref 0.2–1.2)
Total Protein: 6.8 g/dL (ref 6.0–8.3)

## 2022-03-15 LAB — LIPID PANEL
Cholesterol: 153 mg/dL (ref 0–200)
HDL: 62.8 mg/dL (ref >39.00)
LDL Cholesterol: 77 mg/dL (ref 0–99)
NonHDL: 90.2
Total CHOL/HDL Ratio: 2
Triglycerides: 65 mg/dL (ref 0.0–149.0)
VLDL: 13 mg/dL (ref 0.0–40.0)

## 2022-03-15 LAB — T4, FREE: Free T4: 1.19 ng/dL (ref 0.60–1.60)

## 2022-03-15 LAB — TSH: TSH: 0.66 u[IU]/mL (ref 0.35–5.50)

## 2022-03-15 LAB — VITAMIN D 25 HYDROXY (VIT D DEFICIENCY, FRACTURES): VITD: 48.11 ng/mL (ref 30.00–100.00)

## 2022-03-15 LAB — T3, FREE: T3, Free: 2.7 pg/mL (ref 2.3–4.2)

## 2022-05-10 ENCOUNTER — Encounter: Payer: Self-pay | Admitting: Obstetrics and Gynecology

## 2022-05-10 ENCOUNTER — Other Ambulatory Visit (HOSPITAL_COMMUNITY)
Admission: RE | Admit: 2022-05-10 | Discharge: 2022-05-10 | Disposition: A | Discharge status: Home / Self Care | Payer: Medicare PPO | Source: Ambulatory Visit | Attending: Obstetrics and Gynecology | Admitting: Obstetrics and Gynecology

## 2022-05-10 ENCOUNTER — Ambulatory Visit (INDEPENDENT_AMBULATORY_CARE_PROVIDER_SITE_OTHER): Payer: Medicare PPO | Admitting: Obstetrics and Gynecology

## 2022-05-10 VITALS — BP 143/70 | HR 69 | Ht 63.0 in | Wt 116.0 lb

## 2022-05-10 DIAGNOSIS — Z1151 Encounter for screening for human papillomavirus (HPV): Secondary | ICD-10-CM | POA: Insufficient documentation

## 2022-05-10 DIAGNOSIS — Z01419 Encounter for gynecological examination (general) (routine) without abnormal findings: Secondary | ICD-10-CM

## 2022-05-10 NOTE — Patient Instructions (Signed)

## 2022-05-10 NOTE — Progress Notes (Signed)
Megan Good is a 71 y.o. G3P0 female here for a routine annual gynecologic exam.  Current complaints: none.   Denies abnormal vaginal bleeding, discharge, pelvic pain, problems with intercourse or other gynecologic concerns.    Gynecologic History No LMP recorded. Patient is postmenopausal. Contraception: PM Last Pap: 10 yrs ago. Results were: normal Last mammogram: 2023. Results were: normal  Obstetric History OB History  Gravida Para Term Preterm AB Living  3         1  SAB IAB Ectopic Multiple Live Births               # Outcome Date GA Lbr Len/2nd Weight Sex Delivery Anes PTL Lv  3 Gravida           2 Gravida           1 [place]             Past Medical History:  Diagnosis Date   Allergy    SEASONAL   Arthritis    LEFT SHOULDER,HAND-BILATERAL   Cataract    BILATERAL-REMOVED,2014   GERD (gastroesophageal reflux disease)    Headache    Hyperlipidemia    Osteoporosis    Thyroid disease     Past Surgical History:  Procedure Laterality Date   BREAST BIOPSY Left 2003   TONSILLECTOMY     AS A CHILD    Current Outpatient Medications on File Prior to Visit  Medication Sig Dispense Refill   Acetaminophen (TYLENOL PO) Take by mouth. AS NEEDED     famotidine (PEPCID) 20 MG tablet TAKE 1 TABLET BY MOUTH TWICE A DAY 60 tablet 3   Multiple Vitamins-Minerals (MULTIVITAMIN ADULTS PO) Take 1 tablet by mouth daily.     pantoprazole (PROTONIX) 40 MG tablet TAKE 1 TABLET BY MOUTH TWICE A DAY 180 tablet 1   rosuvastatin (CRESTOR) 10 MG tablet Take 1 tablet (10 mg total) by mouth daily. 90 tablet 1   No current facility-administered medications on file prior to visit.    Allergies  Allergen Reactions   Penicillins Swelling and Rash    Has patient had a PCN reaction causing immediate rash, facial/tongue/throat swelling, SOB or lightheadedness with hypotension: No Has patient had a PCN reaction causing severe rash involving mucus membranes or skin necrosis: No Has  patient had a PCN reaction that required hospitalization: No Has patient had a PCN reaction occurring within the last 10 years: No If all of the above answers are "NO", then may proceed with Cephalosporin use.  Burn-like rash    Social History   Socioeconomic History   Marital status: Divorced    Spouse name: Not on file   Number of children: 1   Years of education: Not on file   Highest education level: 12th grade  Occupational History   Occupation: [employer]    Employer: [employer]    Comment: retired 2019  Tobacco Use   Smoking status: Never   Smokeless tobacco: Never  Vaping Use   Vaping Use: Never used  Substance and Sexual Activity   Alcohol use: Never   Drug use: Never   Sexual activity: Not Currently    Birth control/protection: None  Other Topics Concern   Not on file  Social History Narrative   Patient is right-handed. She lives alone in a 2 story house. She occasionally drinks coffee on the weekends. She walks 2-4 miles per day weather permitting. Recently she has been unable to walk dues to headaches.  Social Determinants of Health   Financial Resource Strain: Low Risk  (07/08/2021)   Overall Financial Resource Strain (CARDIA)    Difficulty of Paying Living Expenses: Not hard at all  Food Insecurity: No Food Insecurity (07/08/2021)   Hunger Vital Sign    Worried About Running Out of Food in the Last Year: Never true    Ran Out of Food in the Last Year: Never true  Transportation Needs: No Transportation Needs (07/08/2021)   PRAPARE - Hydrologist (Medical): No    Lack of Transportation (Non-Medical): No  Physical Activity: Sufficiently Active (07/08/2021)   Exercise Vital Sign    Days of Exercise per Week: 7 days    Minutes of Exercise per Session: 150+ min  Stress: No Stress Concern Present (10/31/2020)   [place]    Feeling of Stress :  Only a little  Social Connections: Unknown (07/08/2021)   Social Connection and Isolation Panel [NHANES]    Frequency of Communication with Friends and Family: More than three times a week    Frequency of Social Gatherings with Friends and Family: Once a week    Attends Religious Services: Not on Advertising copywriter or Organizations: Not on file    Attends Archivist Meetings: Not on file    Marital Status: Divorced  Intimate Partner Violence: Not on file    Family History  Problem Relation Age of Onset   Healthy Mother 69   Other Father        MVA   Other Sister        prediabetes   Stroke Maternal Grandmother    Diabetes Maternal Grandfather    Diabetes Maternal Uncle    Diabetes Maternal Uncle    Gestational diabetes Daughter    Colon cancer Neg Hx    Colon polyps Neg Hx    Esophageal cancer Neg Hx    Rectal cancer Neg Hx    Stomach cancer Neg Hx     The following portions of the patient's history were reviewed and updated as appropriate: allergies, current medications, past family history, past medical history, past social history, past surgical history and problem list.  Review of Systems Pertinent items noted in HPI and remainder of comprehensive ROS otherwise negative.   Objective:  BP (!) 143/70   Pulse 69   Ht 5\' 3"  (1.6 m)   Wt 116 lb (52.6 kg)   BMI 20.55 kg/m  Chaperone present CONSTITUTIONAL: Well-developed, well-nourished female in no acute distress.  HENT:  Normocephalic, atraumatic, External right and left ear normal. Oropharynx is clear and moist EYES: Conjunctivae and EOM are normal. Pupils are equal, round, and reactive to light. No scleral icterus.  NECK: Normal range of motion, supple, no masses.  Normal thyroid.  SKIN: Skin is warm and dry. No rash noted. Not diaphoretic. No erythema. No pallor. [place]: Alert and oriented to person, place, and time. Normal reflexes, muscle tone coordination. No cranial nerve deficit  noted. PSYCHIATRIC: Normal mood and affect. Normal behavior. Normal judgment and thought content. CARDIOVASCULAR: Normal heart rate noted, regular rhythm RESPIRATORY: Clear to auscultation bilaterally. Effort and breath sounds normal, no problems with respiration noted. BREASTS: Symmetric in size. No masses, skin changes, nipple drainage, or lymphadenopathy. ABDOMEN: Soft, normal bowel sounds, no distention noted.  No tenderness, rebound or guarding.  PELVIC: Normal appearing external genitalia; Atrophic vaginal mucosa, cervix flush with vaginal side wall,  os, very stenotic, uterus small, no masses  MUSCULOSKELETAL: Normal range of motion. No tenderness.  No cyanosis, clubbing, or edema.  2+ distal pulses.   Assessment:  Annual gynecologic examination with pap smear   Plan:  Will follow up results of pap smear , no further pap smears indicated. Routine preventative health maintenance measures emphasized. Please refer to After Visit Summary for other counseling recommendations.    Hermina Staggers, MD, FACOG Attending Obstetrician & Gynecologist Center for [facility], [facility]

## 2022-05-10 NOTE — Progress Notes (Signed)
New pt here for routine GYN exam. Pt has had mammo this year.  Pt denies any GYN concerns today.

## 2022-05-13 LAB — CYTOLOGY - PAP
Comment: NEGATIVE
Diagnosis: NEGATIVE
High risk HPV: NEGATIVE

## 2022-05-24 DIAGNOSIS — H938X3 Other specified disorders of ear, bilateral: Secondary | ICD-10-CM | POA: Diagnosis not present

## 2022-06-06 ENCOUNTER — Telehealth: Payer: Self-pay | Admitting: Internal Medicine

## 2022-06-06 MED ORDER — FAMOTIDINE 20 MG PO TABS: 20.0000 mg | ORAL_TABLET | Freq: Two times a day (BID) | ORAL | 1 refills | Status: DC

## 2022-06-06 NOTE — Telephone Encounter (Signed)
Pt called to say the pharmacy told her she has one refill left of the  famotidine (PEPCID) 20 MG tablet ...  But, they are waiting for the PA from the MD.  Pt was informed that MD is OOO until 06/12/22.  Pt is asking if another MD can send in the PA for this refill?   LOV:  03/12/22  Please advise.  [facility] [id number] - [place], [place] - [address] Phone: [phone]  Fax: [phone]

## 2022-06-06 NOTE — Telephone Encounter (Signed)
New refill sent

## 2022-07-04 ENCOUNTER — Other Ambulatory Visit: Payer: Self-pay | Admitting: Internal Medicine

## 2022-07-04 DIAGNOSIS — E785 Hyperlipidemia, unspecified: Secondary | ICD-10-CM

## 2022-07-11 IMAGING — CT CT HEART MORP W/ CTA COR W/ SCORE W/ CA W/CM &/OR W/O CM
4 of 7 series · 8 of 20 positions shown, 9 images · IV contrast (APPLIED)
Comparison: None.
COMPARISON: None.

Addendum:
EXAM:
OVER-READ INTERPRETATION  CT CHEST

The following report is an over-read performed by radiologist Dr.
Zaki Sweitzer [REDACTED] on 08/23/2021. This
over-read does not include interpretation of cardiac or coronary
anatomy or pathology. The coronary calcium score/coronary CTA
interpretation by the cardiologist is attached.
CLINICAL DATA: This is a 71 year old female with anginal symptoms.
Cardiac/Coronary  CTA
TECHNIQUE: The patient was scanned on a [employer] Force scanner.

[Series 6: ts diast sharp · axial · 0.39mm/px · z∈[-20,+16]mm · 2 of 268 slices shown]
[im 90/268  lung]
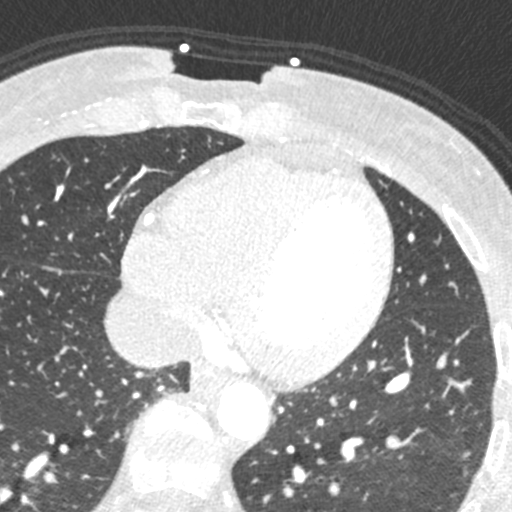
[im 179/268  lung]
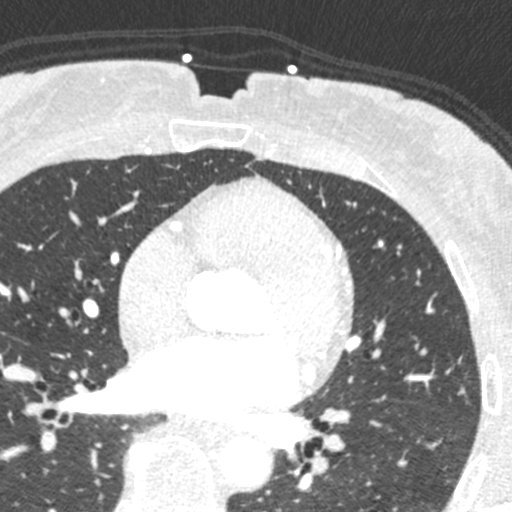

[Series 7: ts syst sharp · axial · 0.39mm/px · z∈[-20,+16]mm · 2 of 268 slices shown]
[im 90/268  lung]
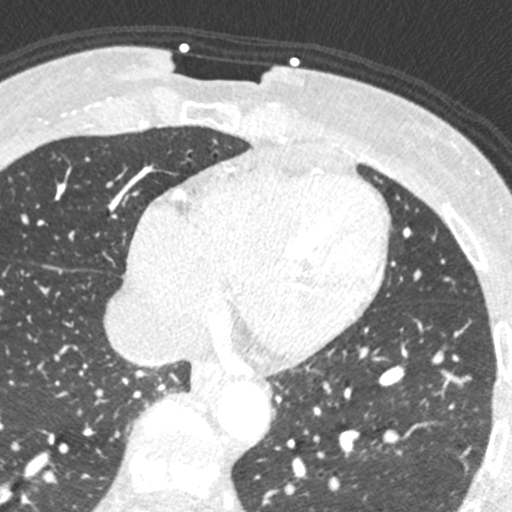
[im 179/268  lung]
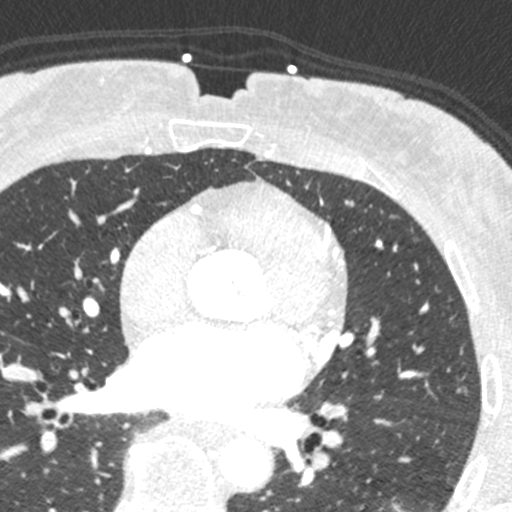

[Series 8: best syst · axial · 0.39mm/px · z∈[-20,+16]mm · 2 of 268 slices shown, 3 images]
[im 90/268  vessel]
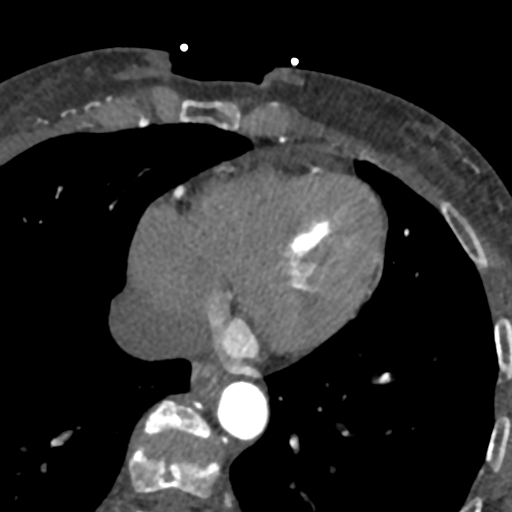
[im 90/268  lung]
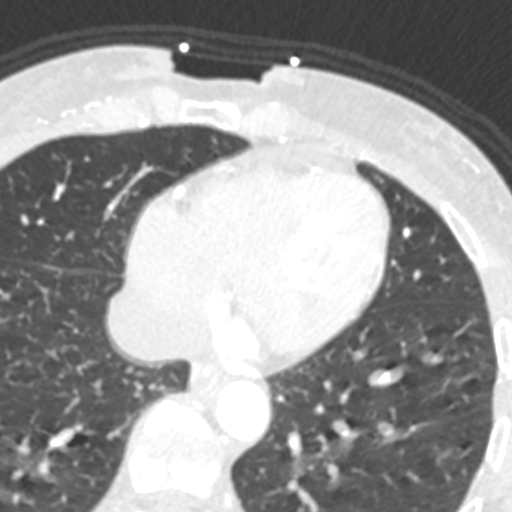
[im 179/268  vessel]
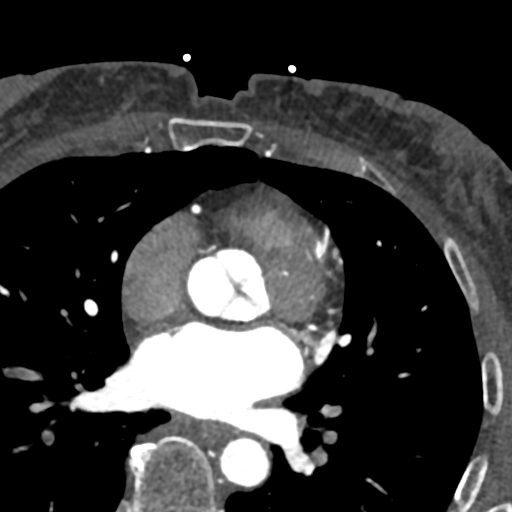

[Series 9: best diast · axial · 0.39mm/px · z∈[-20,+16]mm · 2 of 268 slices shown]
[im 90/268  vessel]
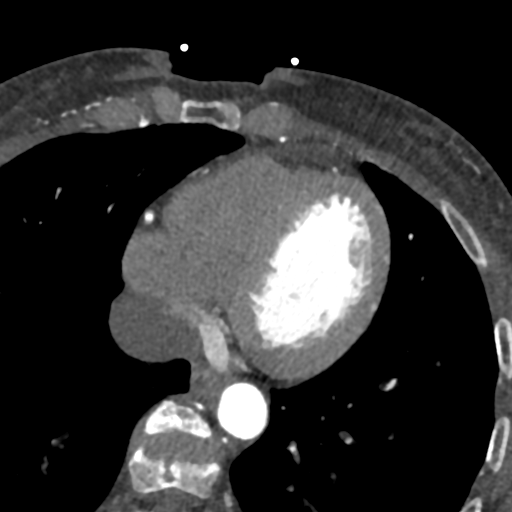
[im 179/268  vessel]
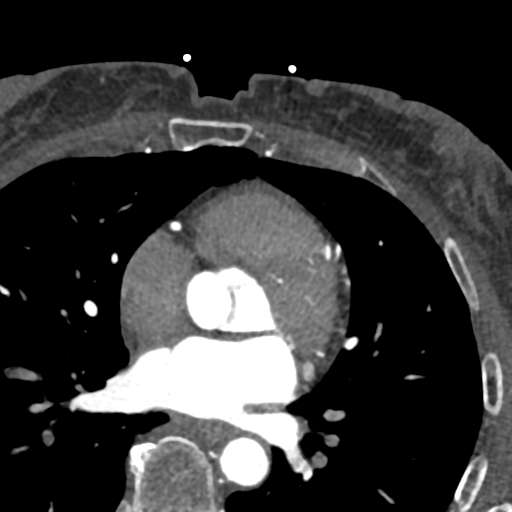

[8 of 20 positions shown; findings below may reference images not displayed]

FINDINGS: Vascular: Normal caliber patent visualized thoracic aorta with
scattered atherosclerotic calcifications.

Mediastinum/Nodes: No enlarged mediastinal lymph nodes. The
visualized trachea and esophagus demonstrate no significant
findings.

Lungs/Pleura: The visualized lungs are clear. No evidence of pleural
effusion or pneumothorax.

Upper Abdomen: No acute abnormality.

Musculoskeletal: No chest wall mass or suspicious bone lesions
identified.
IMPRESSION: 1. No acute extra cardiac, intrathoracic abnormality.
2.  Aortic Atherosclerosis (NXU1X-17X.X).
FINDINGS: A 100 kV prospective scan was triggered in the descending thoracic
aorta at 111 HU's. Axial non-contrast 3 mm slices were carried out
through the heart. The data set was analyzed on a dedicated work
station and scored using the Agatson method. Gantry rotation speed
was 250 msecs and collimation was .6 mm. No beta blockade and 0.8 mg
of sl NTG was given. The 3D data set was reconstructed in 5%
intervals of the 67-82 % of the R-R cycle. Diastolic phases were
analyzed on a dedicated work station using MPR, MIP and VRT modes.
The patient received 80 cc of contrast.

Aorta: Normal size. Mild aortic root calcifications. No dissection.

Aortic Valve:  Trileaflet.  No calcifications.

Coronary Arteries:  Normal coronary origin.  Right dominance.

RCA is a large dominant artery that gives rise to PDA and PLA. There
is no plaque.

Left main is a large artery that gives rise to LAD and LCX arteries.

LAD is a large vessel. There is a minimal (<24%) calcified plaque.
There are no plaques in the mid and distal LAD.

LCX is a non-dominant artery that gives rise to one large OM1
branch. There is no plaque.

Coronary Calcium Score:

Left main: 0

Left anterior descending artery:

Left circumflex artery: 0

Right coronary artery: 0

Total:

Percentile: 44

Other findings:

Normal pulmonary vein drainage into the left atrium.

Normal left atrial appendage without a thrombus.

Normal size of the pulmonary artery.
IMPRESSION: 1. Coronary calcium score of 14.7. This was 44 percentile for age
and sex matched control.

2. Normal coronary origin with right dominance.

3. CAD-RADS 1. Minimal non-obstructive CAD (0-24%). Consider
non-atherosclerotic causes of chest pain. Consider preventive
therapy and risk factor modification.

*** End of Addendum ***
EXAM:
OVER-READ INTERPRETATION  CT CHEST

The following report is an over-read performed by radiologist Dr.
Zaki Sweitzer [REDACTED] on 08/23/2021. This
over-read does not include interpretation of cardiac or coronary
anatomy or pathology. The coronary calcium score/coronary CTA
interpretation by the cardiologist is attached.
FINDINGS: Vascular: Normal caliber patent visualized thoracic aorta with
scattered atherosclerotic calcifications.

Mediastinum/Nodes: No enlarged mediastinal lymph nodes. The
visualized trachea and esophagus demonstrate no significant
findings.

Lungs/Pleura: The visualized lungs are clear. No evidence of pleural
effusion or pneumothorax.

Upper Abdomen: No acute abnormality.

Musculoskeletal: No chest wall mass or suspicious bone lesions
identified.
IMPRESSION: 1. No acute extra cardiac, intrathoracic abnormality.
2.  Aortic Atherosclerosis (NXU1X-17X.X).

## 2022-07-12 IMAGING — DX DG CHEST 1V PORT
1 series · 1 of 1 positions shown · non-contrast
Comparison: Chest x-ray dated July 23, 2021.

CLINICAL DATA: Fatigue, shortness of breath, and dizziness.

EXAM:
PORTABLE CHEST 1 VIEW

[chest ap]
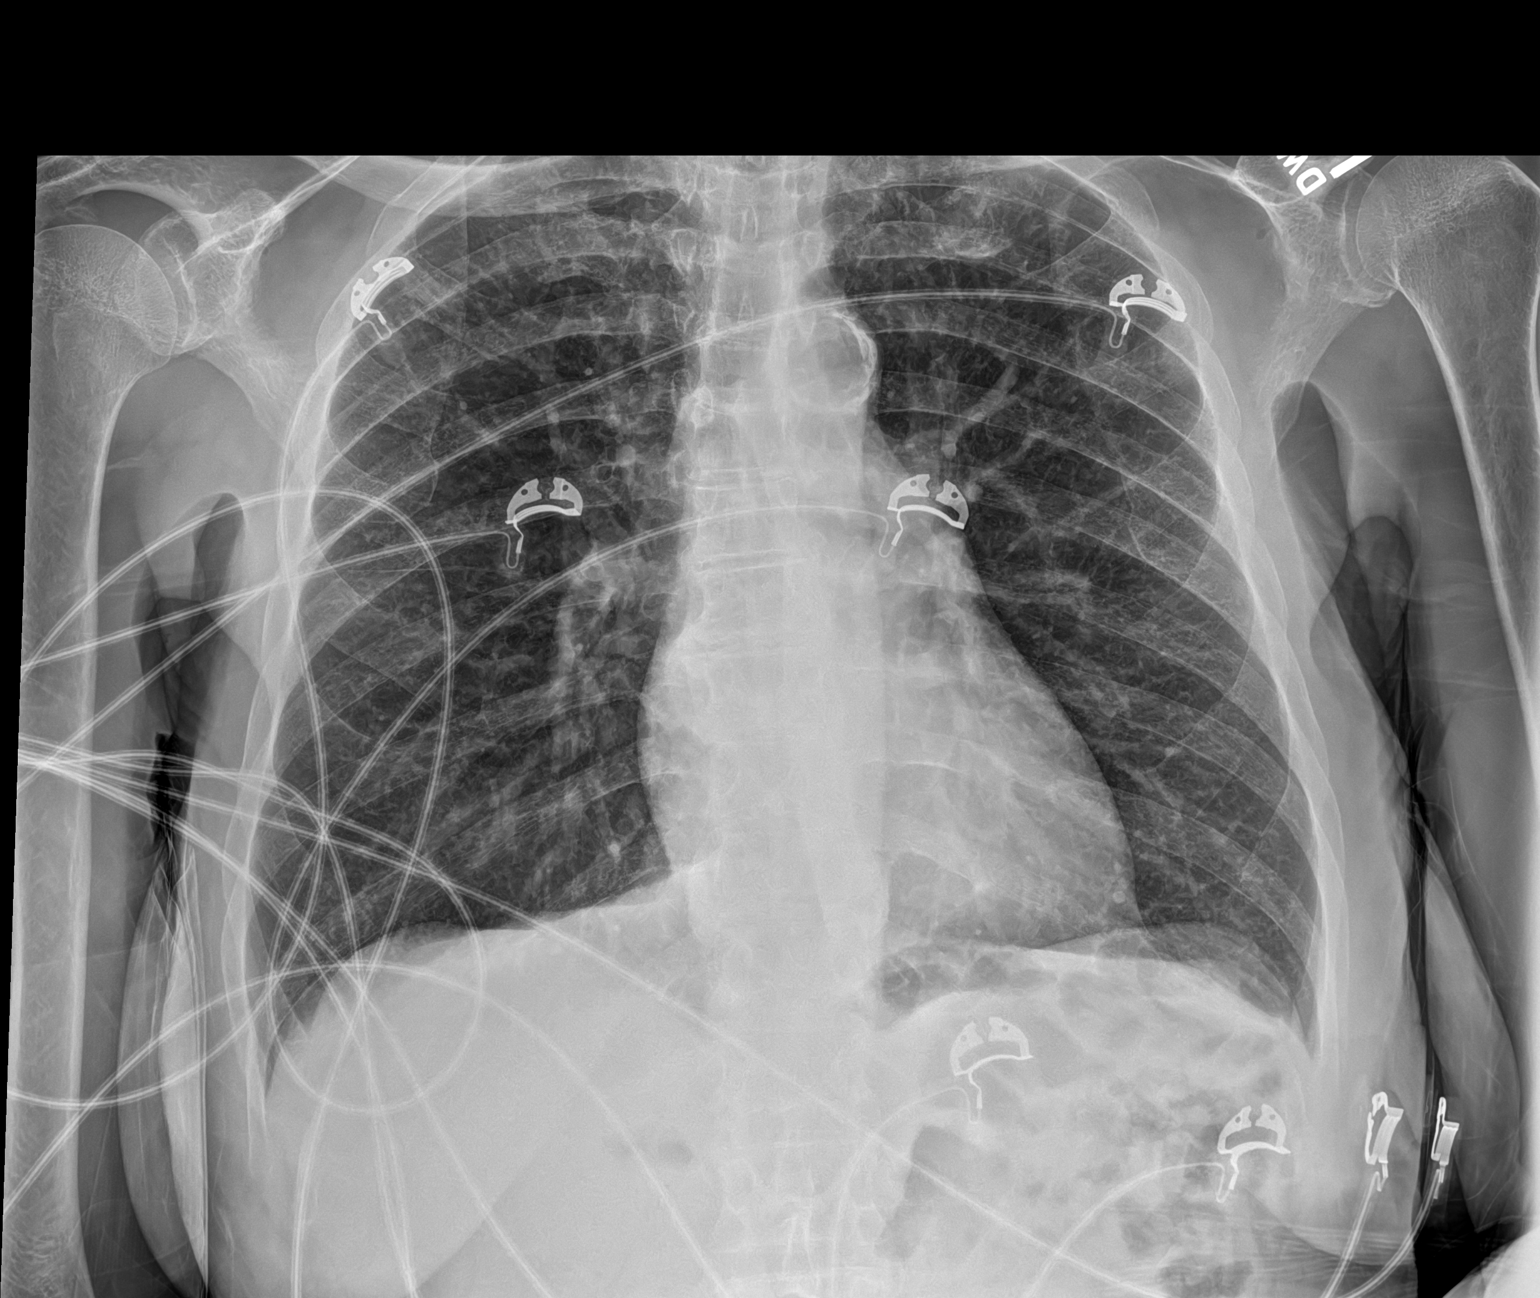

[1 of 1 positions shown; findings below may reference images not displayed]

FINDINGS: The heart size and mediastinal contours are within normal limits.
Both lungs are clear. The visualized skeletal structures are
unremarkable.
IMPRESSION: No active disease.

## 2022-08-02 ENCOUNTER — Other Ambulatory Visit: Payer: Self-pay | Admitting: Internal Medicine

## 2022-08-02 DIAGNOSIS — K219 Gastro-esophageal reflux disease without esophagitis: Secondary | ICD-10-CM

## 2022-11-02 ENCOUNTER — Other Ambulatory Visit: Payer: Self-pay | Admitting: Internal Medicine

## 2022-11-02 DIAGNOSIS — K219 Gastro-esophageal reflux disease without esophagitis: Secondary | ICD-10-CM

## 2022-11-21 ENCOUNTER — Ambulatory Visit: Payer: Medicare PPO | Admitting: Internal Medicine

## 2022-11-22 ENCOUNTER — Encounter: Payer: Self-pay | Admitting: Adult Health

## 2022-11-22 ENCOUNTER — Ambulatory Visit (INDEPENDENT_AMBULATORY_CARE_PROVIDER_SITE_OTHER): Payer: Medicare HMO | Admitting: Adult Health

## 2022-11-22 VITALS — BP 142/60 | HR 82 | Temp 98.7°F | Ht 63.0 in | Wt 118.0 lb

## 2022-11-22 DIAGNOSIS — K5900 Constipation, unspecified: Secondary | ICD-10-CM

## 2022-11-22 DIAGNOSIS — K921 Melena: Secondary | ICD-10-CM | POA: Diagnosis not present

## 2022-11-22 DIAGNOSIS — K648 Other hemorrhoids: Secondary | ICD-10-CM | POA: Diagnosis not present

## 2022-11-22 MED ORDER — HYDROCORTISONE ACETATE 25 MG RE SUPP: 25.0000 mg | Freq: Two times a day (BID) | RECTAL | 1 refills | Status: DC

## 2022-11-22 NOTE — Patient Instructions (Addendum)
It was great meeting you today   You do have a hemorrhoid that is the likely the cause of the bleeding. I have sent in a suppository that will help shrink this hemorrhoid.   Please follow up with Dr. Leonides Schanz about the blood in stool  Phone: [phone]

## 2022-11-22 NOTE — Progress Notes (Signed)
Subjective:    Patient ID: Megan Good, female    DOB: [birth year], 72 y.o.   MRN: [id number]  HPI  72 year old female who  has a past medical history of Allergy, Arthritis, Cataract, GERD (gastroesophageal reflux disease), Headache, Hyperlipidemia, Osteoporosis, and Thyroid disease.  She is a patient of Dr. Ardyth Harps who I am seeing today for an acute issue.   She reports that starting last month she noticed bright red blood when she would wipe. She did not noticed any blood since until yesterday.   Over the last week she has been constipated and took a stool softener yesterday. When she had a bowel movement yesterday she noticed " a lot of bright red blood in her stool and on the toilet paper after each bowel movement." She had 4 bowel movements. She did have a BM today without blood. Has not had any abdominal cramping or nausea/vomiting. She does eat a lot of fruits and vegetables and stays hydrated.   Over the last month she has also noticed a " hard bump inside  my bum". Reports irritation when walking.   She has never had a colonoscopy but does have an established relationship with Dr. Leonides Schanz at [facility].   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 She lives alone in a 2 story house. She occasionally drinks coffee on the weekends. She walks 2-4 miles per day weather permitting. Recently she has been unable to walk dues to headaches.   Social Determinants of Health   Financial Resource Strain: Low Risk  (11/21/2022)   Overall Financial Resource Strain (CARDIA)    Difficulty of Paying Living Expenses: Not hard at all  Food Insecurity: No Food Insecurity (11/21/2022)   Hunger Vital Sign    Worried About Running Out of Food in the Last Year: Never true    Ran Out of Food in the Last Year: Never true  Transportation Needs: No Transportation Needs (11/21/2022)   PRAPARE - Administrator, Civil Service (Medical): No    Lack of Transportation (Non-Medical): No  Physical Activity: Sufficiently Active (11/21/2022)   Exercise Vital Sign    Days of Exercise per Week: 7 days    Minutes of Exercise per Session: 40 min  Stress: No Stress Concern Present (11/21/2022)   [employer] of Occupational Health - Occupational Stress Questionnaire    Feeling of Stress : Not  at all  Social Connections: Unknown (11/21/2022)   Social Connection and Isolation Panel [NHANES]    Frequency of Communication with Friends and Family: More than three times a week    Frequency of Social Gatherings with Friends and Family: Once a week    Attends Religious Services: Patient declined    Database administrator or Organizations: No    Attends Engineer, structural: Not on file    Marital Status: Divorced  Catering manager Violence: Not on file    Past Surgical History:  Procedure Laterality Date   BREAST BIOPSY Left 2003   TONSILLECTOMY     AS A CHILD    Family History  Problem Relation Age of Onset   Healthy Mother 60   Other Father        MVA   Other  Sister        prediabetes   Stroke Maternal Grandmother    Diabetes Maternal Grandfather    Diabetes Maternal Uncle    Diabetes Maternal Uncle    Gestational diabetes Daughter    Colon cancer Neg Hx    Colon polyps Neg Hx    Esophageal cancer Neg Hx    Rectal cancer Neg Hx    Stomach cancer Neg Hx     Allergies  Allergen Reactions   Penicillins Swelling and Rash    Has patient had a PCN reaction causing immediate rash, facial/tongue/throat swelling, SOB or lightheadedness with hypotension: No Has patient had a PCN reaction causing severe rash involving mucus membranes or skin necrosis: No Has patient had a PCN reaction that required hospitalization: No Has patient had a PCN reaction occurring within the last 10 years: No If all of the above answers are "NO", then may proceed with Cephalosporin use.  Burn-like rash    Current Outpatient Medications on File Prior to Visit  Medication Sig Dispense Refill   Acetaminophen (TYLENOL PO) Take by mouth. AS NEEDED     famotidine (PEPCID) 20 MG tablet Take 1 tablet (20 mg total) by mouth 2 (two) times daily. 180 tablet 1   Multiple Vitamins-Minerals (MULTIVITAMIN ADULTS PO) Take 1 tablet by mouth daily.     pantoprazole (PROTONIX) 40 MG tablet TAKE 1 TABLET BY MOUTH TWICE A DAY 180 tablet 1   rosuvastatin (CRESTOR) 10 MG tablet TAKE ONE TABLET BY MOUTH DAILY 90 tablet 1   No current facility-administered medications on file prior to visit.    BP (!) 142/60   Pulse 82   Temp 98.7 F (37.1 C) (Oral)   Ht 5\' 3"  (1.6 m)   Wt 118 lb (53.5 kg)   SpO2 99%   BMI 20.90 kg/m       Objective:   Physical Exam Vitals and nursing note reviewed. Exam conducted with a chaperone present.  Constitutional:      Appearance: Normal appearance.  Cardiovascular:     Rate and Rhythm: Normal rate and regular rhythm.     Pulses: Normal pulses.     Heart sounds: Normal heart sounds.  Pulmonary:     Effort: Pulmonary effort is normal.     Breath  sounds: Normal breath sounds.  Genitourinary:    Rectum: Guaiac result negative. Internal hemorrhoid present. No mass or tenderness. Normal anal tone.  Musculoskeletal:        General: Normal range of motion.  Skin:    General: Skin is warm and dry.  Neurological:     General: No focal deficit present.  Mental Status: She is alert and oriented to person, place, and time.  Psychiatric:        Mood and Affect: Mood normal.        Behavior: Behavior normal.        Thought Content: Thought content normal.        Judgment: Judgment normal.        Assessment & Plan:  1. Blood in stool - Likely from hemorrhoid but I would like her to follow up with her gastroenterologist for follow up and she would benefit from a colonoscopy   2. Constipation, unspecified constipation type - Encouraged to add Metamucil, stay hydrated, exercise and eat healthy   3. Internal hemorrhoid  - hydrocortisone (ANUSOL-HC) 25 MG suppository; Place 1 suppository (25 mg total) rectally 2 (two) times daily.  Dispense: 12 suppository; Refill: 1  Shirline Frees, NP

## 2022-11-25 ENCOUNTER — Ambulatory Visit: Payer: Medicare PPO | Admitting: Internal Medicine

## 2022-11-29 ENCOUNTER — Ambulatory Visit: Payer: Medicare HMO | Admitting: Gastroenterology

## 2022-12-05 ENCOUNTER — Other Ambulatory Visit: Payer: Self-pay | Admitting: Internal Medicine

## 2022-12-25 ENCOUNTER — Ambulatory Visit: Payer: Medicare HMO | Admitting: Internal Medicine

## 2022-12-26 ENCOUNTER — Ambulatory Visit (INDEPENDENT_AMBULATORY_CARE_PROVIDER_SITE_OTHER): Payer: Medicare HMO | Admitting: Internal Medicine

## 2022-12-26 ENCOUNTER — Encounter: Payer: Self-pay | Admitting: Internal Medicine

## 2022-12-26 VITALS — BP 110/64 | HR 73 | Temp 98.2°F | Wt 116.4 lb

## 2022-12-26 DIAGNOSIS — E0590 Thyrotoxicosis, unspecified without thyrotoxic crisis or storm: Secondary | ICD-10-CM | POA: Diagnosis not present

## 2022-12-26 DIAGNOSIS — R252 Cramp and spasm: Secondary | ICD-10-CM

## 2022-12-26 NOTE — Progress Notes (Signed)
Established Patient Office Visit     CC/Reason for Visit: Discuss acute concern  HPI: Megan Good is a 72 y.o. female who is coming in today for the above mentioned reasons. Past Medical History is significant for: Hyperlipidemia, osteoporosis and subclinical hyperthyroidism.  For the last week to 10 days she has been experiencing significant cramping in both legs but greater on the left side.  It is on her outer calf.  Lately she has also had tingling of her toes of that foot.   Past Medical/Surgical History: Past Medical History:  Diagnosis Date   Allergy    SEASONAL   Arthritis    LEFT SHOULDER,HAND-BILATERAL   Cataract    BILATERAL-REMOVED,2014   GERD (gastroesophageal reflux disease)    Headache    Hyperlipidemia    Osteoporosis    Thyroid disease     Past Surgical History:  Procedure Laterality Date   BREAST BIOPSY Left 2003   TONSILLECTOMY     AS A CHILD    Social History:  reports that she has never smoked. She has never used smokeless tobacco. She reports that she does not drink alcohol and does not use drugs.  Allergies: Allergies  Allergen Reactions   Penicillins Swelling and Rash    Has patient had a PCN reaction causing immediate rash, facial/tongue/throat swelling, SOB or lightheadedness with hypotension: No Has patient had a PCN reaction causing severe rash involving mucus membranes or skin necrosis: No Has patient had a PCN reaction that required hospitalization: No Has patient had a PCN reaction occurring within the last 10 years: No If all of the above answers are "NO", then may proceed with Cephalosporin use.  Burn-like rash    Family History:  Family History  Problem Relation Age of Onset   Healthy Mother 21   Other Father        MVA   Other Sister        prediabetes   Stroke Maternal Grandmother    Diabetes Maternal Grandfather    Diabetes Maternal Uncle    Diabetes Maternal Uncle    Gestational diabetes Daughter     Colon cancer Neg Hx    Colon polyps Neg Hx    Esophageal cancer Neg Hx    Rectal cancer Neg Hx    Stomach cancer Neg Hx      Current Outpatient Medications:    Acetaminophen (TYLENOL PO), Take by mouth. AS NEEDED, Disp: , Rfl:    famotidine (PEPCID) 20 MG tablet, TAKE 1 TABLET BY MOUTH TWICE A DAY, Disp: 180 tablet, Rfl: 1   hydrocortisone (ANUSOL-HC) 25 MG suppository, Place 1 suppository (25 mg total) rectally 2 (two) times daily., Disp: 12 suppository, Rfl: 1   Multiple Vitamins-Minerals (MULTIVITAMIN ADULTS PO), Take 1 tablet by mouth daily., Disp: , Rfl:    pantoprazole (PROTONIX) 40 MG tablet, TAKE 1 TABLET BY MOUTH TWICE A DAY, Disp: 180 tablet, Rfl: 1   rosuvastatin (CRESTOR) 10 MG tablet, TAKE ONE TABLET BY MOUTH DAILY, Disp: 90 tablet, Rfl: 1  Review of Systems:  Negative unless indicated in HPI.   Physical Exam: Vitals:   12/26/22 1318  BP: 110/64  Pulse: 73  Temp: 98.2 F (36.8 C)  TempSrc: Oral  SpO2: 99%  Weight: 116 lb 6.4 oz (52.8 kg)    Body mass index is 20.62 kg/m.   Physical Exam Vitals reviewed.  Constitutional:      Appearance: Normal appearance.  HENT:     Head: Normocephalic  and atraumatic.  Eyes:     Conjunctiva/sclera: Conjunctivae normal.     Pupils: Pupils are equal, round, and reactive to light.  Cardiovascular:     Rate and Rhythm: Normal rate and regular rhythm.  Pulmonary:     Effort: Pulmonary effort is normal.     Breath sounds: Normal breath sounds.  Skin:    General: Skin is warm and dry.  Neurological:     General: No focal deficit present.     Mental Status: She is alert and oriented to person, place, and time.  Psychiatric:        Mood and Affect: Mood normal.        Behavior: Behavior normal.        Thought Content: Thought content normal.        Judgment: Judgment normal.      Impression and Plan:  Bilateral leg cramps -     CBC with Differential/Platelet; Future -     Magnesium; Future -     Vitamin B12;  Future -     VITAMIN D 25 Hydroxy (Vit-D Deficiency, Fractures); Future  Subclinical hyperthyroidism -     TSH; Future -     T4, free; Future -     T3, free; Future   -She has good DP and TP pulses in her foot. -Rule out electrolyte deficiency, B12 deficiency especially given tingling and numbness in her toes, also check thyroid studies as she is known to have subclinical hyperthyroidism.  Time spent:31 minutes reviewing chart, interviewing and examining patient and formulating plan of care.     Chaya Jan, MD [facility]

## 2022-12-27 LAB — CBC WITH DIFFERENTIAL/PLATELET
Basophils Absolute: 0.1 10*3/uL (ref 0.0–0.1)
Basophils Relative: 0.9 % (ref 0.0–3.0)
Eosinophils Absolute: 0.1 10*3/uL (ref 0.0–0.7)
Eosinophils Relative: 0.9 % (ref 0.0–5.0)
HCT: 37.7 % (ref 36.0–46.0)
Hemoglobin: 12 g/dL (ref 12.0–15.0)
Lymphocytes Relative: 22.3 % (ref 12.0–46.0)
Lymphs Abs: 1.5 10*3/uL (ref 0.7–4.0)
MCHC: 31.9 g/dL (ref 30.0–36.0)
MCV: 86.9 fl (ref 78.0–100.0)
Monocytes Absolute: 0.7 10*3/uL (ref 0.1–1.0)
Monocytes Relative: 10.4 % (ref 3.0–12.0)
Neutro Abs: 4.5 10*3/uL (ref 1.4–7.7)
Neutrophils Relative %: 65.5 % (ref 43.0–77.0)
Platelets: 161 10*3/uL (ref 150.0–400.0)
RBC: 4.34 Mil/uL (ref 3.87–5.11)
RDW: 14 % (ref 11.5–15.5)
WBC: 6.8 10*3/uL (ref 4.0–10.5)

## 2022-12-27 LAB — T4, FREE: Free T4: 1.14 ng/dL (ref 0.60–1.60)

## 2022-12-27 LAB — T3, FREE: T3, Free: 2.7 pg/mL (ref 2.3–4.2)

## 2022-12-27 LAB — VITAMIN D 25 HYDROXY (VIT D DEFICIENCY, FRACTURES): VITD: 32.27 ng/mL (ref 30.00–100.00)

## 2022-12-27 LAB — MAGNESIUM: Magnesium: 2.1 mg/dL (ref 1.5–2.5)

## 2022-12-27 LAB — TSH: TSH: 0.38 u[IU]/mL (ref 0.35–5.50)

## 2022-12-27 LAB — VITAMIN B12: Vitamin B-12: 1303 pg/mL — ABNORMAL HIGH (ref 211–911)

## 2023-01-03 ENCOUNTER — Telehealth: Payer: Self-pay | Admitting: Internal Medicine

## 2023-01-03 DIAGNOSIS — R252 Cramp and spasm: Secondary | ICD-10-CM

## 2023-01-03 NOTE — Telephone Encounter (Signed)
Pt is calling and has viewed her blood work however the pt is still having muscle cramps and pain in groin area and would like a referral to specialists also a callback

## 2023-01-06 NOTE — Telephone Encounter (Signed)
Tried to call the patient, but the line was busy

## 2023-02-04 ENCOUNTER — Encounter: Payer: Self-pay | Admitting: Gastroenterology

## 2023-02-04 ENCOUNTER — Other Ambulatory Visit: Payer: Self-pay | Admitting: Internal Medicine

## 2023-02-04 ENCOUNTER — Ambulatory Visit: Payer: Medicare HMO | Admitting: Gastroenterology

## 2023-02-04 VITALS — BP 120/60 | HR 64 | Ht 62.5 in | Wt 116.2 lb

## 2023-02-04 DIAGNOSIS — Z1211 Encounter for screening for malignant neoplasm of colon: Secondary | ICD-10-CM

## 2023-02-04 DIAGNOSIS — K219 Gastro-esophageal reflux disease without esophagitis: Secondary | ICD-10-CM | POA: Diagnosis not present

## 2023-02-04 DIAGNOSIS — E785 Hyperlipidemia, unspecified: Secondary | ICD-10-CM

## 2023-02-04 MED ORDER — PANTOPRAZOLE SODIUM 20 MG PO TBEC: 20.0000 mg | DELAYED_RELEASE_TABLET | Freq: Two times a day (BID) | ORAL | 5 refills | Status: AC

## 2023-02-04 MED ORDER — NA SULFATE-K SULFATE-MG SULF 17.5-3.13-1.6 GM/177ML PO SOLN: 1.0000 | Freq: Once | ORAL | 0 refills | Status: AC

## 2023-02-04 NOTE — Patient Instructions (Addendum)
We have sent the following medications to your pharmacy for you to pick up at your convenience: Pantoprazole 20 mg - 1-2 tablets twice daily, try to decrease to 1 tablet twice daily .  You have been scheduled for a colonoscopy. Please follow written instructions given to you at your visit today.   Please pick up your prep supplies at the pharmacy within the next 1-3 days.  If you use inhalers (even only as needed), please bring them with you on the day of your procedure.  DO NOT TAKE 7 DAYS PRIOR TO TEST- Trulicity (dulaglutide) Ozempic, Wegovy (semaglutide) Mounjaro (tirzepatide) Bydureon Bcise (exanatide extended release)  DO NOT TAKE 1 DAY PRIOR TO YOUR TEST Rybelsus (semaglutide) Adlyxin (lixisenatide) Victoza (liraglutide) Byetta (exanatide) _______________________________________________________  If your blood pressure at your visit was 140/90 or greater, please contact your primary care physician to follow up on this.  _______________________________________________________  If you are age 27 or older, your body mass index should be between 23-30. Your Body mass index is 20.92 kg/m. If this is out of the aforementioned range listed, please consider follow up with your Primary Care Provider.  If you are age 30 or younger, your body mass index should be between 19-25. Your Body mass index is 20.92 kg/m. If this is out of the aformentioned range listed, please consider follow up with your Primary Care Provider.   ________________________________________________________  The [facility] providers would like to encourage you to use [facility] to communicate with providers for non-urgent requests or questions.  Due to long hold times on the telephone, sending your provider a message by [facility] may be a faster and more efficient way to get a response.  Please allow 48 business hours for a response.  Please remember that this is for non-urgent requests.   _______________________________________________________

## 2023-02-04 NOTE — Progress Notes (Signed)
02/04/2023 Megan Good [id number] March 25, 1951   HISTORY OF PRESENT ILLNESS: This is a pleasant 72 year old female.  She is a patient of Dr. Derek Mound.  She has history of osteoporosis and hypothyroidism.  She follows here for issues with reflux.  She was last seen here in January 2023.  She had an endoscopy in April 2023 and had some Candida esophagitis, responded well to fluconazole therapy.  She is on pantoprazole 40 mg twice daily and famotidine 20 mg twice daily, but takes the doses of both medications at the same time.  She feels well recently.  Her weight loss is stabilized.  She says that she has osteoporosis and cannot tolerate calcium, but does take a multivitamin and vitamin D supplement daily.  She tried cutting down on both her pantoprazole and her famotidine, but did not do well with that.  She has never had a colonoscopy.  She was scheduled last year, but canceled because she had not been feeling well and did not feel she could tolerate the prep.  Is willing to reschedule at this time.   Past Medical History:  Diagnosis Date   Allergy    SEASONAL   Arthritis    LEFT SHOULDER,HAND-BILATERAL   Cataract    BILATERAL-REMOVED,2014   GERD (gastroesophageal reflux disease)    Headache    Hyperlipidemia    Osteoporosis    Thyroid disease    Past Surgical History:  Procedure Laterality Date   BREAST BIOPSY Left 2003   TONSILLECTOMY     AS A CHILD    reports that she has never smoked. She has never used smokeless tobacco. She reports that she does not drink alcohol and does not use drugs. family history includes Diabetes in her maternal grandfather, maternal uncle, and maternal uncle; Gestational diabetes in her daughter; Healthy (age of onset: 1) in her mother; Other in her father and sister; Stroke in her maternal grandmother. Allergies  Allergen Reactions   Penicillins Swelling and Rash    Has patient had a PCN reaction causing immediate rash,  facial/tongue/throat swelling, SOB or lightheadedness with hypotension: No Has patient had a PCN reaction causing severe rash involving mucus membranes or skin necrosis: No Has patient had a PCN reaction that required hospitalization: No Has patient had a PCN reaction occurring within the last 10 years: No If all of the above answers are "NO", then may proceed with Cephalosporin use.  Burn-like rash      Outpatient Encounter Medications as of 02/04/2023  Medication Sig   Acetaminophen (TYLENOL PO) Take by mouth. AS NEEDED   famotidine (PEPCID) 20 MG tablet TAKE 1 TABLET BY MOUTH TWICE A DAY   Multiple Vitamins-Minerals (MULTIVITAMIN ADULTS PO) Take 1 tablet by mouth daily.   pantoprazole (PROTONIX) 40 MG tablet TAKE 1 TABLET BY MOUTH TWICE A DAY   rosuvastatin (CRESTOR) 10 MG tablet TAKE 1 TABLET BY MOUTH DAILY   [DISCONTINUED] hydrocortisone (ANUSOL-HC) 25 MG suppository Place 1 suppository (25 mg total) rectally 2 (two) times daily.   No facility-administered encounter medications on file as of 02/04/2023.     REVIEW OF SYSTEMS  : All other systems reviewed and negative except where noted in the History of Present Illness.   PHYSICAL EXAM: BP 120/60 (BP Location: Left Arm, Patient Position: Sitting, Cuff Size: Normal)   Pulse 64   Ht 5' 2.5" (1.588 m)   Wt 116 lb 4 oz (52.7 kg)   BMI 20.92 kg/m  General: Well developed female in  no acute distress Head: Normocephalic and atraumatic Eyes:  Sclerae anicteric, conjunctiva pink. Ears: Normal auditory acuity Lungs: Clear throughout to auscultation; no W/R/R. Heart: Regular rate and rhythm; no M/R/G. Abdomen: Soft, non-distended.  BS present.  Non-tender. Rectal:  Will be done at the time of colonoscopy. Musculoskeletal: Symmetrical with no gross deformities  Skin: No lesions on visible extremities Extremities: No edema  Neurological: Alert oriented x 4, grossly non-focal Psychological:  Alert and cooperative. Normal mood and  affect  ASSESSMENT AND PLAN: *GERD: Overall doing well on pantoprazole 40 mg twice daily and famotidine 20 mg twice daily.  She is actually taking the pantoprazole and the famotidine doses at the same time, with the pantoprazole and famotidine in the morning and pantoprazole and the famotidine in the evening.  I asked her to stagger her doses with pantoprazole in the morning and evening and famotidine at lunchtime and bedtime.  She does have osteoporosis and cannot tolerate taking calcium supplements but takes a multivitamin with vitamin D.  She is concerned about effects of PPI, but she has tried to cut out a dose of each of the medications in the past.  Advised that maybe we can go down to pantoprazole 20 mg twice daily and continue the famotidine 20 mg twice daily and made with the staggered doses she will do better.  She is going to try that.  Will send a new prescription to her pharmacy, but I am going to send for the 20 mg pills and write enough for her to take 2 pills twice daily in case she needs to increase the dose back again to the full 40 mg twice daily dosing. *CRC screening:  Never had a colonoscopy in the past.  Will schedule with Dr. Leonides Schanz.  The risks, benefits, and alternatives to colonoscopy were discussed with the patient and she consents to proceed.   CC:  Philip Aspen, [place]*

## 2023-02-04 NOTE — Progress Notes (Signed)
I agree with the assessment and plan as outlined by Ms. Zehr. 

## 2023-02-05 ENCOUNTER — Ambulatory Visit: Payer: Medicare HMO | Admitting: Orthopedic Surgery

## 2023-03-13 ENCOUNTER — Encounter: Payer: Medicare HMO | Admitting: Internal Medicine

## 2023-03-18 ENCOUNTER — Encounter: Payer: Medicare HMO | Admitting: Internal Medicine

## 2023-04-08 ENCOUNTER — Encounter: Payer: Self-pay | Admitting: Internal Medicine

## 2023-04-08 ENCOUNTER — Ambulatory Visit (INDEPENDENT_AMBULATORY_CARE_PROVIDER_SITE_OTHER): Payer: Medicare HMO | Admitting: Internal Medicine

## 2023-04-08 VITALS — BP 120/80 | HR 91 | Temp 98.0°F | Wt 120.2 lb

## 2023-04-08 DIAGNOSIS — R222 Localized swelling, mass and lump, trunk: Secondary | ICD-10-CM

## 2023-04-08 NOTE — Progress Notes (Signed)
Established Patient Office Visit     CC/Reason for Visit: Left breast lump  HPI: Megan Good is a 72 y.o. female who is coming in today for the above mentioned reasons.  This past weekend she noticed what she believes is a left breast lump.  She has deflated breasts and the lump is actually attached to the chest wall in the anterior axillary line.  Not painful.   Past Medical/Surgical History: Past Medical History:  Diagnosis Date   Allergy    SEASONAL   Arthritis    LEFT SHOULDER,HAND-BILATERAL   Cataract    BILATERAL-REMOVED,2014   GERD (gastroesophageal reflux disease)    Headache    Hyperlipidemia    Osteoporosis    Thyroid disease     Past Surgical History:  Procedure Laterality Date   BREAST BIOPSY Left 2003   TONSILLECTOMY     AS A CHILD    Social History:  reports that she has never smoked. She has never used smokeless tobacco. She reports that she does not drink alcohol and does not use drugs.  Allergies: Allergies  Allergen Reactions   Penicillins Swelling and Rash    Has patient had a PCN reaction causing immediate rash, facial/tongue/throat swelling, SOB or lightheadedness with hypotension: No Has patient had a PCN reaction causing severe rash involving mucus membranes or skin necrosis: No Has patient had a PCN reaction that required hospitalization: No Has patient had a PCN reaction occurring within the last 10 years: No If all of the above answers are "NO", then may proceed with Cephalosporin use.  Burn-like rash    Family History:  Family History  Problem Relation Age of Onset   Healthy Mother 63   Other Father        MVA   Other Sister        prediabetes   Stroke Maternal Grandmother    Diabetes Maternal Grandfather    Diabetes Maternal Uncle    Diabetes Maternal Uncle    Gestational diabetes Daughter    Colon cancer Neg Hx    Colon polyps Neg Hx    Esophageal cancer Neg Hx    Rectal cancer Neg Hx    Stomach cancer Neg  Hx      Current Outpatient Medications:    Acetaminophen (TYLENOL PO), Take by mouth. AS NEEDED, Disp: , Rfl:    famotidine (PEPCID) 20 MG tablet, TAKE 1 TABLET BY MOUTH TWICE A DAY, Disp: 180 tablet, Rfl: 1   Multiple Vitamins-Minerals (MULTIVITAMIN ADULTS PO), Take 1 tablet by mouth daily., Disp: , Rfl:    pantoprazole (PROTONIX) 20 MG tablet, Take 1-2 tablets (20-40 mg total) by mouth 2 (two) times daily., Disp: 120 tablet, Rfl: 5   rosuvastatin (CRESTOR) 10 MG tablet, TAKE 1 TABLET BY MOUTH DAILY, Disp: 90 tablet, Rfl: 0  Review of Systems:  Negative unless indicated in HPI.   Physical Exam: Vitals:   04/08/23 1550  BP: 120/80  Pulse: 91  Temp: 98 F (36.7 C)  TempSrc: Oral  SpO2: 99%  Weight: 120 lb 3.2 oz (54.5 kg)    Body mass index is 21.63 kg/m.   Physical Exam Chest:     Chest wall: Mass present.     Comments: Small oval mass of the upper left chest wall into the axilla.     Impression and Plan:  Mass of chest wall, left -     MM Digital Diagnostic Unilat L; Future -     US  BREAST COMPLETE UNI LEFT INC AXILLA; Future     Time spent:21 minutes reviewing chart, interviewing and examining patient and formulating plan of care.     Chaya Jan, MD [facility]

## 2023-04-23 ENCOUNTER — Encounter: Payer: Medicare HMO | Admitting: Internal Medicine

## 2023-04-23 DIAGNOSIS — E782 Mixed hyperlipidemia: Secondary | ICD-10-CM

## 2023-04-30 ENCOUNTER — Ambulatory Visit (INDEPENDENT_AMBULATORY_CARE_PROVIDER_SITE_OTHER): Payer: Medicare HMO | Admitting: Internal Medicine

## 2023-04-30 ENCOUNTER — Encounter: Payer: Self-pay | Admitting: Internal Medicine

## 2023-04-30 VITALS — BP 120/68 | HR 80 | Temp 98.1°F | Ht 63.0 in | Wt 118.5 lb

## 2023-04-30 DIAGNOSIS — E782 Mixed hyperlipidemia: Secondary | ICD-10-CM

## 2023-04-30 DIAGNOSIS — Z1159 Encounter for screening for other viral diseases: Secondary | ICD-10-CM

## 2023-04-30 DIAGNOSIS — Z0000 Encounter for general adult medical examination without abnormal findings: Secondary | ICD-10-CM

## 2023-04-30 DIAGNOSIS — M542 Cervicalgia: Secondary | ICD-10-CM

## 2023-04-30 DIAGNOSIS — Z780 Asymptomatic menopausal state: Secondary | ICD-10-CM

## 2023-04-30 DIAGNOSIS — E0590 Thyrotoxicosis, unspecified without thyrotoxic crisis or storm: Secondary | ICD-10-CM

## 2023-04-30 NOTE — Progress Notes (Signed)
Established Patient Office Visit     CC/Reason for Visit: Annual preventive exam and subsequent Medicare wellness visit  HPI: Megan Good is a 72 y.o. female who is coming in today for the above mentioned reasons. Past Medical History is significant for:   Past Medical History: No date: Allergy     Comment:  SEASONAL No date: Arthritis     Comment:  LEFT SHOULDER,HAND-BILATERAL No date: Cataract     Comment:  BILATERAL-REMOVED,2014 No date: GERD (gastroesophageal reflux disease) No date: Headache No date: Hyperlipidemia No date: Osteoporosis No date: Thyroid disease    Past Medical/Surgical History: Past Medical History:  Diagnosis Date   Allergy    SEASONAL   Arthritis    LEFT SHOULDER,HAND-BILATERAL   Cataract    BILATERAL-REMOVED,2014   GERD (gastroesophageal reflux disease)    Headache    Hyperlipidemia    Osteoporosis    Thyroid disease     Past Surgical History:  Procedure Laterality Date   BREAST BIOPSY Left 2003   TONSILLECTOMY     AS A CHILD    Social History:  reports that she has never smoked. She has never used smokeless tobacco. She reports that she does not drink alcohol and does not use drugs.  Allergies: Allergies  Allergen Reactions   Penicillins Swelling and Rash    Has patient had a PCN reaction causing immediate rash, facial/tongue/throat swelling, SOB or lightheadedness with hypotension: No Has patient had a PCN reaction causing severe rash involving mucus membranes or skin necrosis: No Has patient had a PCN reaction that required hospitalization: No Has patient had a PCN reaction occurring within the last 10 years: No If all of the above answers are "NO", then may proceed with Cephalosporin use.  Burn-like rash    Family History:  Family History  Problem Relation Age of Onset   Healthy Mother 56   Other Father        MVA   Other Sister        prediabetes   Stroke Maternal Grandmother    Diabetes Maternal  Grandfather    Diabetes Maternal Uncle    Diabetes Maternal Uncle    Gestational diabetes Daughter    Colon cancer Neg Hx    Colon polyps Neg Hx    Esophageal cancer Neg Hx    Rectal cancer Neg Hx    Stomach cancer Neg Hx      Current Outpatient Medications:    Acetaminophen (TYLENOL PO), Take by mouth. AS NEEDED, Disp: , Rfl:    famotidine (PEPCID) 20 MG tablet, TAKE 1 TABLET BY MOUTH TWICE A DAY, Disp: 180 tablet, Rfl: 1   Multiple Vitamins-Minerals (MULTIVITAMIN ADULTS PO), Take 1 tablet by mouth daily., Disp: , Rfl:    pantoprazole (PROTONIX) 20 MG tablet, Take 1-2 tablets (20-40 mg total) by mouth 2 (two) times daily., Disp: 120 tablet, Rfl: 5   rosuvastatin (CRESTOR) 10 MG tablet, TAKE 1 TABLET BY MOUTH DAILY, Disp: 90 tablet, Rfl: 0  Review of Systems:  Negative unless indicated in HPI.   Physical Exam: Vitals:   04/30/23 1440  BP: 120/68  Pulse: 80  Temp: 98.1 F (36.7 C)  TempSrc: Oral  SpO2: 98%  Weight: 118 lb 8 oz (53.8 kg)  Height: 5\' 3"  (1.6 m)    Body mass index is 20.99 kg/m.   Physical Exam Vitals reviewed.  Constitutional:      General: She is not in acute distress.    Appearance:  Normal appearance. She is not ill-appearing, toxic-appearing or diaphoretic.  HENT:     Head: Normocephalic.     Right Ear: Tympanic membrane, ear canal and external ear normal. There is no impacted cerumen.     Left Ear: Tympanic membrane, ear canal and external ear normal. There is no impacted cerumen.     Nose: Nose normal.     Mouth/Throat:     Mouth: Mucous membranes are moist.     Pharynx: Oropharynx is clear. No oropharyngeal exudate or posterior oropharyngeal erythema.  Eyes:     General: No scleral icterus.       Right eye: No discharge.        Left eye: No discharge.     Conjunctiva/sclera: Conjunctivae normal.     Pupils: Pupils are equal, round, and reactive to light.  Neck:     Vascular: No carotid bruit.  Cardiovascular:     Rate and Rhythm:  Normal rate and regular rhythm.     Pulses: Normal pulses.     Heart sounds: Normal heart sounds.  Pulmonary:     Effort: Pulmonary effort is normal. No respiratory distress.     Breath sounds: Normal breath sounds.  Abdominal:     General: Abdomen is flat. Bowel sounds are normal.     Palpations: Abdomen is soft.  Musculoskeletal:        General: Normal range of motion.     Cervical back: Normal range of motion.  Skin:    General: Skin is warm and dry.  Neurological:     General: No focal deficit present.     Mental Status: She is alert and oriented to person, place, and time. Mental status is at baseline.  Psychiatric:        Mood and Affect: Mood normal.        Behavior: Behavior normal.        Thought Content: Thought content normal.        Judgment: Judgment normal.    Subsequent Medicare wellness visit   1. Risk factors, based on past  M,S,F - Cardiac Risk Factors include: advanced age (>68men, >18 women);dyslipidemia   2.  Physical activities: Dietary issues and exercise activities discussed:      3.  Depression/mood:  Flowsheet Row Office Visit from 04/30/2023 in [facility] Total Score 0        4.  ADL's:    04/30/2023    2:39 PM 04/23/2023    1:02 PM  In your present state of health, do you have any difficulty performing the following activities:  Hearing? 0 0  Vision? 0 0  Difficulty concentrating or making decisions? 0 0  Walking or climbing stairs? 0 0  Dressing or bathing? 0 0  Doing errands, shopping? 0 0  Preparing Food and eating ? N N  Using the Toilet? N N  In the past six months, have you accidently leaked urine? N N  Do you have problems with loss of bowel control? N N  Managing your Medications? N N  Managing your Finances? N N  Housekeeping or managing your Housekeeping? N N     5.  Fall risk:     11/21/2022   11:45 AM 12/26/2022    1:32 PM 04/08/2023    3:49 PM 04/23/2023    1:02 PM  04/30/2023    2:41 PM  Fall Risk  Falls in the past year? 0 0 0 0 0  Was  there an injury with Fall?  0 0  0  Fall Risk Category Calculator  0 0  0  Fall risk Follow up  Falls evaluation completed Falls evaluation completed  Falls evaluation completed     6.  Home safety: No problems identified   7.  Height weight, and visual acuity: height and weight as above, vision/hearing: Vision Screening   Right eye Left eye Both eyes  Without correction 20/20 20/20 20/20   With correction        8.  Counseling: Counseling given: Not Answered    9. Lab orders based on risk factors: Laboratory update will be reviewed   10. Cognitive assessment:        04/30/2023    2:42 PM  6CIT Screen  What Year? 0 points  What month? 0 points  What time? 0 points  Count back from 20 0 points  Months in reverse 0 points  Repeat phrase 0 points  Total Score 0 points     11. Screening: Patient provided with a written and personalized 5-10 year screening schedule in the AVS. Health Maintenance  Topic Date Due   Hepatitis C Screening  Never done   DTaP/Tdap/Td vaccine (1 - Tdap) Never done   Pneumonia Vaccine (2 of 2 - PCV) 03/11/2020   COVID-19 Vaccine (5 - 2023-24 season) 05/04/2023   Colon Cancer Screening  07/12/2023*   Mammogram  12/22/2023   Medicare Annual Wellness Visit  04/29/2024   Flu Shot  Completed   DEXA scan (bone density measurement)  Completed   Zoster (Shingles) Vaccine  Completed   HPV Vaccine  Aged Out  *Topic was postponed. The date shown is not the original due date.    12. Provider List Update: Patient Care Team    Relationship Specialty Notifications Start End  Philip Aspen, Limmie Patricia, MD PCP - General Internal Medicine  11/09/19   Thomasene Ripple, DO PCP - Cardiology Cardiology  08/08/21      13. Advance Directives: Does Patient Have a Medical Advance Directive?: No Would patient like information on creating a medical advance directive?: No - Patient  declined  14. Opioids: Patient is not on any opioid prescriptions and has no risk factors for a substance use disorder.   15.   Goals      go to church     Evidence-based guidance:  Assess hydration and the ability of child to take oral fluids.  Encourage adequate oral intake based on age, weight, usual diet; encourage smaller, more frequent feedings.   Promote clearance of nasal secretions prior to feeding.  Suggest caregiver monitoring and reporting of number of wet diapers and color of urine; begin a voiding diary.  Develop a rescue or action plan when symptoms worsen, including no wet diapers in over 8 hours, child becomes increasingly listless or too sleepy to eat, respiratory effort is increasing and/or decreased fluid intake; include   calling office prior to taking child to urgent care or emergency room.   Notes:          I have personally reviewed and noted the following in the patient's chart:   Medical and social history Use of alcohol, tobacco or illicit drugs  Current medications and supplements Functional ability and status Nutritional status Physical activity Advanced directives List of other physicians Hospitalizations, surgeries, and ER visits in previous 12 months Vitals Screenings to include cognitive, depression, and falls Referrals and appointments  In addition, I have reviewed and discussed with patient certain  preventive protocols, quality metrics, and best practice recommendations. A written personalized care plan for preventive services as well as general preventive health recommendations were provided to patient.   Impression and Plan:  Medicare annual wellness visit, subsequent  Mixed hyperlipidemia -     CBC with Differential/Platelet; Future -     Comprehensive metabolic panel; Future -     Lipid panel; Future  Subclinical hyperthyroidism -     TSH; Future  Neck pain -     Ambulatory referral to Orthopedics  Encounter for hepatitis C  screening test for low risk patient -     Hepatitis C antibody; Future  Postmenopausal estrogen deficiency -     DG Bone Density; Future   -Recommend routine eye and dental care. -Healthy lifestyle discussed in detail. -Labs to be updated today. -Prostate cancer screening: N/A Health Maintenance  Topic Date Due   Hepatitis C Screening  Never done   DTaP/Tdap/Td vaccine (1 - Tdap) Never done   Pneumonia Vaccine (2 of 2 - PCV) 03/11/2020   COVID-19 Vaccine (5 - 2023-24 season) 05/04/2023   Colon Cancer Screening  07/12/2023*   Mammogram  12/22/2023   Medicare Annual Wellness Visit  04/29/2024   Flu Shot  Completed   DEXA scan (bone density measurement)  Completed   Zoster (Shingles) Vaccine  Completed   HPV Vaccine  Aged Out  *Topic was postponed. The date shown is not the original due date.     -Advised to obtain Tdap vaccine at pharmacy.    Chaya Jan, MD [facility] at [facility]

## 2023-05-01 ENCOUNTER — Other Ambulatory Visit: Payer: Self-pay | Admitting: Internal Medicine

## 2023-05-01 ENCOUNTER — Telehealth (HOSPITAL_BASED_OUTPATIENT_CLINIC_OR_DEPARTMENT_OTHER): Payer: Self-pay | Admitting: Internal Medicine

## 2023-05-01 DIAGNOSIS — E785 Hyperlipidemia, unspecified: Secondary | ICD-10-CM

## 2023-05-01 LAB — COMPREHENSIVE METABOLIC PANEL
ALT: 17 U/L (ref 0–35)
AST: 23 U/L (ref 0–37)
Albumin: 4.4 g/dL (ref 3.5–5.2)
Alkaline Phosphatase: 61 U/L (ref 39–117)
BUN: 11 mg/dL (ref 6–23)
CO2: 28 meq/L (ref 19–32)
Calcium: 8.9 mg/dL (ref 8.4–10.5)
Chloride: 104 meq/L (ref 96–112)
Creatinine, Ser: 0.82 mg/dL (ref 0.40–1.20)
GFR: 70.82 mL/min (ref >60.00)
Glucose, Bld: 95 mg/dL (ref 70–99)
Potassium: 3.8 meq/L (ref 3.5–5.1)
Sodium: 139 meq/L (ref 135–145)
Total Bilirubin: 0.6 mg/dL (ref 0.2–1.2)
Total Protein: 6.9 g/dL (ref 6.0–8.3)

## 2023-05-01 LAB — CBC WITH DIFFERENTIAL/PLATELET
Basophils Absolute: 0 10*3/uL (ref 0.0–0.1)
Basophils Relative: 0.6 % (ref 0.0–3.0)
Eosinophils Absolute: 0 10*3/uL (ref 0.0–0.7)
Eosinophils Relative: 0.6 % (ref 0.0–5.0)
HCT: 38.1 % (ref 36.0–46.0)
Hemoglobin: 12.2 g/dL (ref 12.0–15.0)
Lymphocytes Relative: 27.9 % (ref 12.0–46.0)
Lymphs Abs: 1.4 10*3/uL (ref 0.7–4.0)
MCHC: 32 g/dL (ref 30.0–36.0)
MCV: 88 fL (ref 78.0–100.0)
Monocytes Absolute: 0.5 10*3/uL (ref 0.1–1.0)
Monocytes Relative: 9.4 % (ref 3.0–12.0)
Neutro Abs: 3 10*3/uL (ref 1.4–7.7)
Neutrophils Relative %: 61.5 % (ref 43.0–77.0)
Platelets: 142 10*3/uL — ABNORMAL LOW (ref 150.0–400.0)
RBC: 4.33 Mil/uL (ref 3.87–5.11)
RDW: 13.8 % (ref 11.5–15.5)
WBC: 4.9 10*3/uL (ref 4.0–10.5)

## 2023-05-01 LAB — LIPID PANEL
Cholesterol: 175 mg/dL (ref 0–200)
HDL: 62.1 mg/dL (ref >39.00)
LDL Cholesterol: 102 mg/dL — ABNORMAL HIGH (ref 0–99)
NonHDL: 112.79
Total CHOL/HDL Ratio: 3
Triglycerides: 52 mg/dL (ref 0.0–149.0)
VLDL: 10.4 mg/dL (ref 0.0–40.0)

## 2023-05-01 LAB — TSH: TSH: 0.56 u[IU]/mL (ref 0.35–5.50)

## 2023-05-01 LAB — HEPATITIS C ANTIBODY: Hepatitis C Ab: NONREACTIVE

## 2023-05-05 ENCOUNTER — Other Ambulatory Visit: Payer: Self-pay | Admitting: Internal Medicine

## 2023-05-05 DIAGNOSIS — K219 Gastro-esophageal reflux disease without esophagitis: Secondary | ICD-10-CM

## 2023-05-07 ENCOUNTER — Ambulatory Visit
Admission: RE | Admit: 2023-05-07 | Discharge: 2023-05-07 | Disposition: A | Discharge status: Home / Self Care | Payer: Medicare HMO | Source: Ambulatory Visit | Attending: Internal Medicine | Admitting: Internal Medicine

## 2023-05-07 DIAGNOSIS — R222 Localized swelling, mass and lump, trunk: Secondary | ICD-10-CM

## 2023-05-07 DIAGNOSIS — N6332 Unspecified lump in axillary tail of the left breast: Secondary | ICD-10-CM | POA: Diagnosis not present

## 2023-05-26 ENCOUNTER — Other Ambulatory Visit: Payer: Self-pay | Admitting: Internal Medicine

## 2023-05-30 ENCOUNTER — Encounter: Payer: Medicare HMO | Admitting: Internal Medicine

## 2023-06-23 ENCOUNTER — Inpatient Hospital Stay (HOSPITAL_BASED_OUTPATIENT_CLINIC_OR_DEPARTMENT_OTHER): Admission: RE | Admit: 2023-06-23 | Payer: Medicare HMO | Source: Ambulatory Visit

## 2023-06-24 ENCOUNTER — Ambulatory Visit: Payer: Medicare HMO | Admitting: Physical Medicine and Rehabilitation

## 2023-06-25 DIAGNOSIS — J189 Pneumonia, unspecified organism: Secondary | ICD-10-CM | POA: Diagnosis not present

## 2023-06-25 DIAGNOSIS — I700 Atherosclerosis of aorta: Secondary | ICD-10-CM | POA: Diagnosis not present

## 2023-06-25 DIAGNOSIS — M8580 Other specified disorders of bone density and structure, unspecified site: Secondary | ICD-10-CM | POA: Diagnosis not present

## 2023-06-25 DIAGNOSIS — R052 Subacute cough: Secondary | ICD-10-CM | POA: Diagnosis not present

## 2023-06-25 DIAGNOSIS — Z1152 Encounter for screening for COVID-19: Secondary | ICD-10-CM | POA: Diagnosis not present

## 2023-06-25 DIAGNOSIS — R918 Other nonspecific abnormal finding of lung field: Secondary | ICD-10-CM | POA: Diagnosis not present

## 2023-07-15 ENCOUNTER — Encounter: Payer: Self-pay | Admitting: Internal Medicine

## 2023-07-15 ENCOUNTER — Ambulatory Visit (INDEPENDENT_AMBULATORY_CARE_PROVIDER_SITE_OTHER): Payer: Medicare HMO | Admitting: Internal Medicine

## 2023-07-15 VITALS — BP 98/64 | HR 70 | Temp 97.8°F | Wt 108.8 lb

## 2023-07-15 DIAGNOSIS — J189 Pneumonia, unspecified organism: Secondary | ICD-10-CM | POA: Diagnosis not present

## 2023-07-15 DIAGNOSIS — Z09 Encounter for follow-up examination after completed treatment for conditions other than malignant neoplasm: Secondary | ICD-10-CM | POA: Diagnosis not present

## 2023-07-15 NOTE — Progress Notes (Signed)
 Established Patient Office Visit     CC/Reason for Visit: Hospital follow-up  HPI: Megan Good is a 73 y.o. female who is coming in today for the above mentioned reasons.  She went to outside emergency department on January 15 due to fever and shortness of breath and cough.  She was diagnosed with a left lower lobe pneumonia and treated with antibiotics.  She is no longer short of breath has a dry cough productive of clear sputum but she still feels very fatigued.  She has lost 10 pounds.   Past Medical/Surgical History: Past Medical History:  Diagnosis Date   Allergy    SEASONAL   Arthritis    LEFT SHOULDER,HAND-BILATERAL   Cataract    BILATERAL-REMOVED,2014   GERD (gastroesophageal reflux disease)    Headache    Hyperlipidemia    Osteoporosis    Thyroid  disease     Past Surgical History:  Procedure Laterality Date   BREAST BIOPSY Left 2003   TONSILLECTOMY     AS A CHILD    Social History:  reports that she has never smoked. She has never used smokeless tobacco. She reports that she does not drink alcohol and does not use drugs.  Allergies: Allergies  Allergen Reactions   Penicillins Swelling and Rash    Has patient had a PCN reaction causing immediate rash, facial/tongue/throat swelling, SOB or lightheadedness with hypotension: No Has patient had a PCN reaction causing severe rash involving mucus membranes or skin necrosis: No Has patient had a PCN reaction that required hospitalization: No Has patient had a PCN reaction occurring within the last 10 years: No If all of the above answers are NO, then may proceed with Cephalosporin use.  Burn-like rash    Family History:  Family History  Problem Relation Age of Onset   Healthy Mother 43   Other Father        MVA   Other Sister        prediabetes   Stroke Maternal Grandmother    Diabetes Maternal Grandfather    Diabetes Maternal Uncle    Diabetes Maternal Uncle    Gestational diabetes  Daughter    Colon cancer Neg Hx    Colon polyps Neg Hx    Esophageal cancer Neg Hx    Rectal cancer Neg Hx    Stomach cancer Neg Hx      Current Outpatient Medications:    Acetaminophen (TYLENOL PO), Take by mouth. AS NEEDED, Disp: , Rfl:    famotidine  (PEPCID ) 20 MG tablet, TAKE 1 TABLET BY MOUTH 2 TIMES A DAY, Disp: 180 tablet, Rfl: 1   Multiple Vitamins-Minerals (MULTIVITAMIN ADULTS PO), Take 1 tablet by mouth daily., Disp: , Rfl:    pantoprazole  (PROTONIX ) 20 MG tablet, Take 1-2 tablets (20-40 mg total) by mouth 2 (two) times daily., Disp: 120 tablet, Rfl: 5   pantoprazole  (PROTONIX ) 40 MG tablet, TAKE 1 TABLET BY MOUTH 2 TIMES A DAY, Disp: 180 tablet, Rfl: 1   rosuvastatin  (CRESTOR ) 10 MG tablet, TAKE 1 TABLET BY MOUTH DAILY, Disp: 90 tablet, Rfl: 1  Review of Systems:  Negative unless indicated in HPI.   Physical Exam: Vitals:   07/15/23 0727  BP: 98/64  Pulse: 70  Temp: 97.8 F (36.6 C)  TempSrc: Oral  SpO2: 99%  Weight: 108 lb 12.8 oz (49.4 kg)    Body mass index is 19.27 kg/m.   Physical Exam Vitals reviewed.  Constitutional:      Appearance: Normal  appearance.  HENT:     Right Ear: Tympanic membrane, ear canal and external ear normal.     Left Ear: Tympanic membrane, ear canal and external ear normal.     Mouth/Throat:     Mouth: Mucous membranes are moist.     Pharynx: Oropharynx is clear.  Eyes:     Conjunctiva/sclera: Conjunctivae normal.     Pupils: Pupils are equal, round, and reactive to light.  Cardiovascular:     Rate and Rhythm: Normal rate and regular rhythm.  Pulmonary:     Effort: Pulmonary effort is normal.     Breath sounds: Normal breath sounds.  Neurological:     Mental Status: She is alert.      Impression and Plan:  Hospital discharge follow-up  Pneumonia of left lower lobe due to infectious organism   -Outside ED records reviewed in detail. -She was diagnosed with a left lobar pneumonia and treated with Rocephin in the  ED and was sent home on azithromycin . -She is improved other than continued fatigue.  She has lost 10 pounds so we have discussed ways that she can increase her oral intake.  Time spent:30 minutes reviewing chart, interviewing and examining patient and formulating plan of care.     Tully Theophilus Andrews, MD [facility]

## 2023-08-22 ENCOUNTER — Ambulatory Visit (HOSPITAL_BASED_OUTPATIENT_CLINIC_OR_DEPARTMENT_OTHER): Admission: RE | Admit: 2023-08-22 | Payer: Medicare HMO | Source: Ambulatory Visit

## 2023-08-25 ENCOUNTER — Other Ambulatory Visit: Payer: Self-pay | Admitting: Internal Medicine

## 2023-08-25 MED ORDER — FAMOTIDINE 20 MG PO TABS: 20.0000 mg | ORAL_TABLET | Freq: Two times a day (BID) | ORAL | 1 refills | Status: DC

## 2023-08-25 NOTE — Telephone Encounter (Signed)
 Copied from CRM [id number]. Topic: Clinical - Medication Refill >> Aug 25, 2023  1:31 PM Turkey A wrote: Most Recent Primary Care Visit:  Provider: Henderson Cloud  Department: [facility]  Visit Type: HOSPITAL FOLLOW UP  Date: 07/15/2023  Medication: famotidine (PEPCID) 20 MG tablet  Has the patient contacted their pharmacy? No (Agent: If no, request that the patient contact the pharmacy for the refill. If patient does not wish to contact the pharmacy document the reason why and proceed with request.) (Agent: If yes, when and what did the pharmacy advise?)  Is this the correct pharmacy for this prescription? Yes If no, delete pharmacy and type the correct one.  This is the patient's preferred pharmacy:  [facility] [id number] [place], [place] - [address] [place] [address] Phone: [phone] Fax: [phone]   Has the prescription been filled recently? No  Is the patient out of the medication? No 2 pills left  Has the patient been seen for an appointment in the last year OR does the patient have an upcoming appointment? Yes  Can we respond through MyChart? Yes  Agent: Please be advised that Rx refills may take up to 3 business days. We ask that you follow-up with your pharmacy.

## 2023-10-15 ENCOUNTER — Ambulatory Visit

## 2023-10-15 ENCOUNTER — Ambulatory Visit (INDEPENDENT_AMBULATORY_CARE_PROVIDER_SITE_OTHER): Admitting: Internal Medicine

## 2023-10-15 ENCOUNTER — Encounter: Payer: Self-pay | Admitting: Internal Medicine

## 2023-10-15 VITALS — BP 108/62 | HR 75 | Temp 98.4°F | Ht 63.0 in | Wt 116.0 lb

## 2023-10-15 DIAGNOSIS — R1033 Periumbilical pain: Secondary | ICD-10-CM

## 2023-10-15 DIAGNOSIS — J189 Pneumonia, unspecified organism: Secondary | ICD-10-CM | POA: Diagnosis not present

## 2023-10-15 NOTE — Progress Notes (Signed)
 Established Patient Office Visit     CC/Reason for Visit: Discuss abdominal pain and requesting chest x-ray  HPI: Megan Good is a 73 y.o. female who is coming in today for the above mentioned reasons.  4 days ago she had an episode of intense stabbing pain around her navel area that lasted a few hours and then dissipated.  No nausea or vomiting, no diarrhea or constipation, no dysuria or urinary frequency.  She has not had any issues since.  She is also due for her 6-week follow-up chest x-ray for her pneumonia.   Past Medical/Surgical History: Past Medical History:  Diagnosis Date   Allergy    SEASONAL   Arthritis    LEFT SHOULDER,HAND-BILATERAL   Cataract    BILATERAL-REMOVED,2014   GERD (gastroesophageal reflux disease)    Headache    Hyperlipidemia    Osteoporosis    Thyroid  disease     Past Surgical History:  Procedure Laterality Date   BREAST BIOPSY Left 2003   TONSILLECTOMY     AS A CHILD    Social History:  reports that she has never smoked. She has never used smokeless tobacco. She reports that she does not drink alcohol and does not use drugs.  Allergies: Allergies  Allergen Reactions   Penicillins Swelling and Rash    Has patient had a PCN reaction causing immediate rash, facial/tongue/throat swelling, SOB or lightheadedness with hypotension: No Has patient had a PCN reaction causing severe rash involving mucus membranes or skin necrosis: No Has patient had a PCN reaction that required hospitalization: No Has patient had a PCN reaction occurring within the last 10 years: No If all of the above answers are "NO", then may proceed with Cephalosporin use.  Burn-like rash    Family History:  Family History  Problem Relation Age of Onset   Healthy Mother 65   Other Father        MVA   Other Sister        prediabetes   Stroke Maternal Grandmother    Diabetes Maternal Grandfather    Diabetes Maternal Uncle    Diabetes Maternal Uncle     Gestational diabetes Daughter    Colon cancer Neg Hx    Colon polyps Neg Hx    Esophageal cancer Neg Hx    Rectal cancer Neg Hx    Stomach cancer Neg Hx      Current Outpatient Medications:    Acetaminophen (TYLENOL PO), Take by mouth. AS NEEDED, Disp: , Rfl:    famotidine  (PEPCID ) 20 MG tablet, Take 1 tablet (20 mg total) by mouth 2 (two) times daily., Disp: 180 tablet, Rfl: 1   Multiple Vitamins-Minerals (MULTIVITAMIN ADULTS PO), Take 1 tablet by mouth daily., Disp: , Rfl:    pantoprazole  (PROTONIX ) 20 MG tablet, Take 1-2 tablets (20-40 mg total) by mouth 2 (two) times daily., Disp: 120 tablet, Rfl: 5   pantoprazole  (PROTONIX ) 40 MG tablet, TAKE 1 TABLET BY MOUTH 2 TIMES A DAY, Disp: 180 tablet, Rfl: 1   rosuvastatin  (CRESTOR ) 10 MG tablet, TAKE 1 TABLET BY MOUTH DAILY, Disp: 90 tablet, Rfl: 1  Review of Systems:  Negative unless indicated in HPI.   Physical Exam: Vitals:   10/15/23 1552  BP: 108/62  Pulse: 75  Temp: 98.4 F (36.9 C)  TempSrc: Oral  SpO2: 98%  Weight: 116 lb (52.6 kg)  Height: 5\' 3"  (1.6 m)    Body mass index is 20.55 kg/m.   Physical Exam  Abdominal:     General: Abdomen is flat. Bowel sounds are normal. There is no distension.     Palpations: Abdomen is soft.     Tenderness: There is no abdominal tenderness. There is no guarding or rebound. Negative signs include Murphy's sign.      Impression and Plan:  Pneumonia of left lower lobe due to infectious organism -     DG Chest 2 View; Future  Periumbilical abdominal pain  - With transient abdominal pain and no concerning symptoms, I feel no further workup is necessary at this time.  Observation for now. - Chest x-ray to follow-up resolution of pneumonia.   Time spent:23 minutes reviewing chart, interviewing and examining patient and formulating plan of care.     Marguerita Shih, MD [facility]

## 2023-10-20 ENCOUNTER — Encounter: Payer: Self-pay | Admitting: Internal Medicine

## 2023-10-28 ENCOUNTER — Other Ambulatory Visit: Payer: Self-pay | Admitting: Internal Medicine

## 2023-10-28 DIAGNOSIS — K219 Gastro-esophageal reflux disease without esophagitis: Secondary | ICD-10-CM

## 2023-10-28 DIAGNOSIS — E785 Hyperlipidemia, unspecified: Secondary | ICD-10-CM

## 2024-03-23 ENCOUNTER — Ambulatory Visit (INDEPENDENT_AMBULATORY_CARE_PROVIDER_SITE_OTHER): Admitting: Internal Medicine

## 2024-03-23 ENCOUNTER — Encounter: Payer: Self-pay | Admitting: Internal Medicine

## 2024-03-23 ENCOUNTER — Ambulatory Visit: Payer: Self-pay | Admitting: *Deleted

## 2024-03-23 VITALS — BP 144/72 | HR 82 | Temp 98.5°F | Wt 118.5 lb

## 2024-03-23 DIAGNOSIS — K625 Hemorrhage of anus and rectum: Secondary | ICD-10-CM

## 2024-03-23 DIAGNOSIS — K648 Other hemorrhoids: Secondary | ICD-10-CM | POA: Diagnosis not present

## 2024-03-23 MED ORDER — HYDROCORTISONE (PERIANAL) 2.5 % EX CREA: 1.0000 | TOPICAL_CREAM | Freq: Two times a day (BID) | CUTANEOUS | 2 refills | Status: AC

## 2024-03-23 NOTE — Telephone Encounter (Signed)
 noted

## 2024-03-23 NOTE — Progress Notes (Signed)
 Established Patient Office Visit     CC/Reason for Visit: Rectal bleeding  HPI: Megan Good is a 74 y.o. female who is coming in today for the above mentioned reasons. Past Medical History is significant for: Internal hemorrhoids.  She has been feeling some pressure in her anal verge.  Today she had 2 bowel movements with blood when wiping.   Past Medical/Surgical History: Past Medical History:  Diagnosis Date   Allergy    SEASONAL   Arthritis    LEFT SHOULDER,HAND-BILATERAL   Cataract    BILATERAL-REMOVED,2014   GERD (gastroesophageal reflux disease)    Headache    Hyperlipidemia    Osteoporosis    Thyroid  disease     Past Surgical History:  Procedure Laterality Date   BREAST BIOPSY Left 2003   TONSILLECTOMY     AS A CHILD    Social History:  reports that she has never smoked. She has never used smokeless tobacco. She reports that she does not drink alcohol and does not use drugs.  Allergies: Allergies  Allergen Reactions   Penicillins Swelling and Rash    Has patient had a PCN reaction causing immediate rash, facial/tongue/throat swelling, SOB or lightheadedness with hypotension: No Has patient had a PCN reaction causing severe rash involving mucus membranes or skin necrosis: No Has patient had a PCN reaction that required hospitalization: No Has patient had a PCN reaction occurring within the last 10 years: No If all of the above answers are NO, then may proceed with Cephalosporin use.  Burn-like rash    Family History:  Family History  Problem Relation Age of Onset   Healthy Mother 63   Other Father        MVA   Other Sister        prediabetes   Stroke Maternal Grandmother    Diabetes Maternal Grandfather    Diabetes Maternal Uncle    Diabetes Maternal Uncle    Gestational diabetes Daughter    Colon cancer Neg Hx    Colon polyps Neg Hx    Esophageal cancer Neg Hx    Rectal cancer Neg Hx    Stomach cancer Neg Hx      Current  Outpatient Medications:    Acetaminophen (TYLENOL PO), Take by mouth. AS NEEDED, Disp: , Rfl:    famotidine  (PEPCID ) 20 MG tablet, Take 1 tablet (20 mg total) by mouth 2 (two) times daily., Disp: 180 tablet, Rfl: 1   hydrocortisone  (ANUSOL -HC) 2.5 % rectal cream, Place 1 Application rectally 2 (two) times daily., Disp: 30 g, Rfl: 2   Multiple Vitamins-Minerals (MULTIVITAMIN ADULTS PO), Take 1 tablet by mouth daily., Disp: , Rfl:    pantoprazole  (PROTONIX ) 40 MG tablet, TAKE 1 TABLET BY MOUTH 2 TIMES A DAY, Disp: 180 tablet, Rfl: 1   rosuvastatin  (CRESTOR ) 10 MG tablet, TAKE 1 TABLET BY MOUTH DAILY, Disp: 90 tablet, Rfl: 1   pantoprazole  (PROTONIX ) 20 MG tablet, Take 1-2 tablets (20-40 mg total) by mouth 2 (two) times daily. (Patient not taking: Reported on 03/23/2024), Disp: 120 tablet, Rfl: 5  Review of Systems:  Negative unless indicated in HPI.   Physical Exam: Vitals:   03/23/24 1607 03/23/24 1608  BP: (!) 144/72 (!) 144/72  Pulse: 82   Temp: 98.5 F (36.9 C)   SpO2: 100%   Weight: 118 lb 8 oz (53.8 kg)     Body mass index is 20.99 kg/m.   Impression and Plan:  Rectal bleeding -  Ambulatory referral to General Surgery  Internal hemorrhoid -     Ambulatory referral to General Surgery -     Hydrocortisone  (Perianal); Place 1 Application rectally 2 (two) times daily.  Dispense: 30 g; Refill: 2   - Refer to surgery and start hydrocortisone  cream.   Time spent:22 minutes reviewing chart, interviewing and examining patient and formulating plan of care.     Tully Theophilus Andrews, MD [facility]

## 2024-03-23 NOTE — Telephone Encounter (Signed)
 FYI Only or Action Required?: FYI only for provider.  Patient was last seen in primary care on 10/15/2023 by Theophilus Andrews, Tully GRADE, MD.  Called Nurse Triage reporting Rectal Bleeding.  Symptoms began today.  Interventions attempted: Rest, hydration, or home remedies.  Symptoms are: rapidly worsening.  Triage Disposition: See Physician Within 24 Hours  see 4- 24 hours , appt today   Patient/caregiver understands and will follow disposition?: Yes               Copied from CRM #[id number]. Topic: Clinical - Red Word Triage >> Mar 23, 2024  8:59 AM Robinson H wrote: [facility] that prompted transfer to Nurse Triage: Blood in stool, bright red blood, has gerd but takes medication Reason for Disposition  MODERATE rectal bleeding (e.g., small blood clots, passing blood without stool, or toilet water turns red)  Answer Assessment - Initial Assessment Questions Appt scheduled today . Wants to see PCP only none earlier than 4 pm. Recommended if another episode of bleeding occurs as this am go to ED. Patient very scared and nervous . Emotional support provided to get evaluation from PCP to know what may be causing sx. No severe pain no vomiting no dizziness.     1. APPEARANCE of BLOOD: What color is it? Is it passed separately, on the surface of the stool, or mixed in with the stool?      Blood in commode full. 2. AMOUNT: How much blood was passed?      A lot of blood  3. FREQUENCY: How many times has blood been passed with the stools?      This am  4. ONSET: When was the blood first seen in the stools? (Days or weeks)      Last year from hemorrhoid.  5. DIARRHEA: Is there also some diarrhea? If Yes, ask: How many diarrhea stools in the past 24 hours?      No  6. CONSTIPATION: Do you have constipation? If Yes, ask: How bad is it?     Strained yesterday  7. RECURRENT SYMPTOMS: Have you had blood in your stools before? If Yes, ask: When was the last time?  and What happened that time?      Yes last year from hemorrhoid.  8. BLOOD THINNERS: Do you take any blood thinners? (e.g., aspirin, clopidogrel  / Plavix , coumadin, heparin). Notes: Other strong blood thinners include: Arixtra (fondaparinux), Eliquis (apixaban), Pradaxa (dabigatran), and Xarelto (rivaroxaban).     No  9. OTHER SYMPTOMS: Do you have any other symptoms?  (e.g., abdomen pain, vomiting, dizziness, fever)     Pain in abdomen at times , left side feels like needles when a need to use the bathroom.  10. PREGNANCY: Is there any chance you are pregnant? When was your last menstrual period?       na  Protocols used: Rectal Bleeding-A-AH

## 2024-04-05 DIAGNOSIS — I700 Atherosclerosis of aorta: Secondary | ICD-10-CM | POA: Diagnosis not present

## 2024-04-05 DIAGNOSIS — E0590 Thyrotoxicosis, unspecified without thyrotoxic crisis or storm: Secondary | ICD-10-CM | POA: Diagnosis not present

## 2024-04-05 DIAGNOSIS — E785 Hyperlipidemia, unspecified: Secondary | ICD-10-CM | POA: Diagnosis not present

## 2024-04-05 DIAGNOSIS — M810 Age-related osteoporosis without current pathological fracture: Secondary | ICD-10-CM | POA: Diagnosis not present

## 2024-04-05 DIAGNOSIS — K5900 Constipation, unspecified: Secondary | ICD-10-CM | POA: Diagnosis not present

## 2024-04-05 DIAGNOSIS — I2510 Atherosclerotic heart disease of native coronary artery without angina pectoris: Secondary | ICD-10-CM | POA: Diagnosis not present

## 2024-04-05 DIAGNOSIS — R011 Cardiac murmur, unspecified: Secondary | ICD-10-CM | POA: Diagnosis not present

## 2024-04-05 DIAGNOSIS — R030 Elevated blood-pressure reading, without diagnosis of hypertension: Secondary | ICD-10-CM | POA: Diagnosis not present

## 2024-04-05 DIAGNOSIS — K219 Gastro-esophageal reflux disease without esophagitis: Secondary | ICD-10-CM | POA: Diagnosis not present

## 2024-04-09 ENCOUNTER — Other Ambulatory Visit: Payer: Self-pay | Admitting: Internal Medicine

## 2024-04-09 NOTE — Telephone Encounter (Signed)
 Copied from CRM [id number]. Topic: Clinical - Medication Refill >> Apr 09, 2024  5:07 PM Megan Good wrote: Medication: famotidine  (PEPCID ) 20 MG tablet  Has the patient contacted their pharmacy? No (Agent: If no, request that the patient contact the pharmacy for the refill. If patient does not wish to contact the pharmacy document the reason why and proceed with request.) (Agent: If yes, when and what did the pharmacy advise?)  This is the patient's preferred pharmacy:  [facility] [id number] [place], [place] - [address] [place] [address] Phone: [phone] Fax: [phone]  Is this the correct pharmacy for this prescription? Yes If no, delete pharmacy and type the correct one.   Has the prescription been filled recently? No  Is the patient out of the medication? No  Has the patient been seen for an appointment in the last year OR does the patient have an upcoming appointment? Yes  Can we respond through MyChart? No  Agent: Please be advised that Rx refills may take up to 3 business days. We ask that you follow-up with your pharmacy.

## 2024-04-12 ENCOUNTER — Encounter: Payer: Self-pay | Admitting: Radiology

## 2024-04-12 MED ORDER — FAMOTIDINE 20 MG PO TABS: 20.0000 mg | ORAL_TABLET | Freq: Two times a day (BID) | ORAL | 1 refills | Status: AC

## 2024-04-26 ENCOUNTER — Ambulatory Visit: Payer: Self-pay | Admitting: Surgery

## 2024-04-26 DIAGNOSIS — K644 Residual hemorrhoidal skin tags: Secondary | ICD-10-CM | POA: Diagnosis not present

## 2024-04-26 DIAGNOSIS — K625 Hemorrhage of anus and rectum: Secondary | ICD-10-CM | POA: Diagnosis not present

## 2024-04-26 DIAGNOSIS — K641 Second degree hemorrhoids: Secondary | ICD-10-CM | POA: Diagnosis not present

## 2024-04-26 DIAGNOSIS — Z720 Tobacco use: Secondary | ICD-10-CM | POA: Diagnosis not present

## 2024-06-08 ENCOUNTER — Ambulatory Visit: Admitting: Family Medicine

## 2024-06-22 ENCOUNTER — Other Ambulatory Visit: Payer: Self-pay | Admitting: Internal Medicine

## 2024-06-22 DIAGNOSIS — E785 Hyperlipidemia, unspecified: Secondary | ICD-10-CM

## 2024-06-23 ENCOUNTER — Other Ambulatory Visit: Payer: Self-pay | Admitting: Internal Medicine

## 2024-06-23 DIAGNOSIS — K219 Gastro-esophageal reflux disease without esophagitis: Secondary | ICD-10-CM

## 2024-07-15 ENCOUNTER — Telehealth: Payer: Self-pay

## 2024-07-15 ENCOUNTER — Ambulatory Visit: Admitting: Family Medicine

## 2024-07-15 NOTE — Telephone Encounter (Signed)
 Patient cancelled AWV for today with Dr. Luke and inquired if she can be contacted to reschedule

## 2024-07-16 ENCOUNTER — Emergency Department (HOSPITAL_BASED_OUTPATIENT_CLINIC_OR_DEPARTMENT_OTHER): Admitting: Radiology

## 2024-07-16 ENCOUNTER — Ambulatory Visit: Payer: Self-pay | Admitting: *Deleted

## 2024-07-16 ENCOUNTER — Encounter (HOSPITAL_BASED_OUTPATIENT_CLINIC_OR_DEPARTMENT_OTHER): Payer: Self-pay

## 2024-07-16 ENCOUNTER — Other Ambulatory Visit: Payer: Self-pay

## 2024-07-16 ENCOUNTER — Emergency Department (HOSPITAL_BASED_OUTPATIENT_CLINIC_OR_DEPARTMENT_OTHER)
Admission: EM | Admit: 2024-07-16 | Discharge: 2024-07-16 | Disposition: A | Discharge status: Home / Self Care | Source: Home / Self Care | Attending: Emergency Medicine | Admitting: Emergency Medicine

## 2024-07-16 DIAGNOSIS — R09A2 Foreign body sensation, throat: Secondary | ICD-10-CM

## 2024-07-16 MED ORDER — DEXAMETHASONE 10 MG/ML FOR PEDIATRIC ORAL USE
10.0000 mg | Freq: Once | INTRAMUSCULAR | Status: AC
  Administered 2024-07-16: 10 mg via ORAL
  Filled 2024-07-16: qty 1

## 2024-07-16 NOTE — ED Provider Notes (Signed)
 " [place] EMERGENCY DEPARTMENT AT [facility] Provider Note   CSN: [id number] Arrival date & time: 07/16/24  1015     Patient presents with: Airway Obstruction   Megan Good is a 74 y.o. female.   Patient is a 74 year old female who presents with feeling of something stuck in her throat.  She was trying to swallow a Pepcid  pill.  She said it went down the wrong side.  She started coughing and felt like it got stuck in the back of her throat.  She feels irritated and like it still stuck in the back of her throat.  She does not think she got anything in her lungs.  No ongoing coughing.  She feels a little bit short of breath but thinks it may be just the stress.  She had no vomiting.  She is able to swallow liquids.       Prior to Admission medications  Medication Sig Start Date End Date Taking? Authorizing Provider  Acetaminophen (TYLENOL PO) Take by mouth. AS NEEDED    [provider]  famotidine  (PEPCID ) 20 MG tablet Take 1 tablet (20 mg total) by mouth 2 (two) times daily. 04/12/24   Theophilus Andrews, Tully GRADE, MD  hydrocortisone  (ANUSOL -HC) 2.5 % rectal cream Place 1 Application rectally 2 (two) times daily. 03/23/24   Theophilus Andrews, Tully GRADE, MD  Multiple Vitamins-Minerals (MULTIVITAMIN ADULTS PO) Take 1 tablet by mouth daily.    [provider]  pantoprazole  (PROTONIX ) 20 MG tablet Take 1-2 tablets (20-40 mg total) by mouth 2 (two) times daily. Patient not taking: Reported on 03/23/2024 02/04/23   Zehr, Jessica D, PA-C  pantoprazole  (PROTONIX ) 40 MG tablet TAKE 1 TABLET BY MOUTH 2 TIMES A DAY 06/23/24   Theophilus Andrews, Tully GRADE, MD  rosuvastatin  (CRESTOR ) 10 MG tablet TAKE 1 TABLET BY MOUTH DAILY 06/22/24   Theophilus Andrews, Tully GRADE, MD    Allergies: Penicillins    Review of Systems  Constitutional:  Negative for fatigue and fever.  HENT:  Negative for facial swelling, trouble swallowing and voice change.   Respiratory:  Positive for shortness  of breath.   Cardiovascular:  Negative for chest pain.  Gastrointestinal:  Negative for abdominal pain, nausea and vomiting.  Neurological:  Negative for headaches.    Updated Vital Signs BP (!) 150/69   Pulse 92   Temp 98.1 F (36.7 C) (Oral)   Resp 18   Ht 5' 3 (1.6 m)   Wt 54 kg   SpO2 99%   BMI 21.08 kg/m   Physical Exam Constitutional:      Appearance: She is well-developed.  HENT:     Head: Normocephalic and atraumatic.     Mouth/Throat:     Pharynx: No oropharyngeal exudate or posterior oropharyngeal erythema.     Comments: No visualized foreign body Eyes:     Pupils: Pupils are equal, round, and reactive to light.  Cardiovascular:     Rate and Rhythm: Normal rate and regular rhythm.     Heart sounds: Normal heart sounds.  Pulmonary:     Effort: Pulmonary effort is normal. No respiratory distress.     Breath sounds: Normal breath sounds. No wheezing or rales.  Chest:     Chest wall: No tenderness.  Abdominal:     General: Bowel sounds are normal.     Palpations: Abdomen is soft.     Tenderness: There is no abdominal tenderness. There is no guarding or rebound.  Musculoskeletal:  General: Normal range of motion.     Cervical back: Normal range of motion and neck supple.  Lymphadenopathy:     Cervical: No cervical adenopathy.  Skin:    General: Skin is warm and dry.     Findings: No rash.  Neurological:     Mental Status: She is alert and oriented to person, place, and time.     (all labs ordered are listed, but only abnormal results are displayed) Labs Reviewed - No data to display  EKG: None  Radiology: DG Chest 2 View Result Date: 07/16/2024 CLINICAL DATA:  Possible pill stuck in throat EXAM: CHEST - 2 VIEW COMPARISON:  Oct 15, 2023 FINDINGS: The heart size and mediastinal contours are within normal limits. Both lungs are clear. The visualized skeletal structures are unremarkable. IMPRESSION: No active cardiopulmonary disease. Electronically  Signed   By: Lynwood Landy Raddle M.D.   On: 07/16/2024 11:57   DG Neck Soft Tissue Result Date: 07/16/2024 CLINICAL DATA:  Possible pill stuck in throat. EXAM: NECK SOFT TISSUES - 1+ VIEW COMPARISON:  None Available. FINDINGS: There is no evidence of retropharyngeal soft tissue swelling or epiglottic enlargement. The cervical airway is unremarkable and no radio-opaque foreign body identified. IMPRESSION: Negative. Electronically Signed   By: Lynwood Landy Raddle M.D.   On: 07/16/2024 11:55     Procedures   Medications Ordered in the ED  dexamethasone  (DECADRON ) 10 MG/ML injection for Pediatric ORAL use 10 mg (has no administration in time range)                                    Medical Decision Making Amount and/or Complexity of Data Reviewed Radiology: ordered.   This patient presents to the ED for concern of swallowed foreign body, this involves an extensive number of treatment options, and is a complaint that carries with it a high risk of complications and morbidity.  I considered the following differential and admission for this acute, potentially life threatening condition.  The differential diagnosis includes aspiration, esophageal foreign body, foreign body lodged in the pharynx, pharynx irritation  MDM:    Patient is a 74 year old who feels like she has a pill stuck in the back of her throat.  She points to the left submandibular area.  I do not see anything that I can visualize on inspection.  Her lungs are clear.  She does not have any wheezing or hypoxia.  She had x-rays of her soft tissue neck as well as her chest x-ray which does not show any obvious foreign body or other acute abnormality.  Discussed with ENT on-call, Dr. Llewellyn, who advises patient can call their office for close outpatient follow-up.  She was discharged home in good condition.  She was given dose of Decadron  here in the ED per ENT recommendations.  She already maintains a fairly bland diet given her history of  GERD.  Was advised to call make an appointment with the ENT, return precautions were given.  (Labs, imaging, consults)  Labs: I Ordered, and personally interpreted labs.  The pertinent results include:     Imaging Studies ordered: I ordered imaging studies including chest x-ray, soft tissue neck I independently visualized and interpreted imaging. I agree with the radiologist interpretation  Additional history obtained from  .  External records from outside source obtained and reviewed including    Cardiac Monitoring: The patient was not maintained on a  cardiac monitor.  If on the cardiac monitor, I personally viewed and interpreted the cardiac monitored which showed an underlying rhythm of:    Reevaluation: After the interventions noted above, I reevaluated the patient and found that they have :improved  Social Determinants of Health:    Disposition: Discharged to home  Co morbidities that complicate the patient evaluation  Past Medical History:  Diagnosis Date   Allergy    SEASONAL   Arthritis    LEFT SHOULDER,HAND-BILATERAL   Cataract    BILATERAL-REMOVED,2014   GERD (gastroesophageal reflux disease)    Headache    Hyperlipidemia    Osteoporosis    Thyroid  disease      Medicines Meds ordered this encounter  Medications   dexamethasone  (DECADRON ) 10 MG/ML injection for Pediatric ORAL use 10 mg    I have reviewed the patients home medicines and have made adjustments as needed  Problem List / ED Course: Problem List Items Addressed This Visit   None Visit Diagnoses       Foreign body sensation in throat    -  Primary                Final diagnoses:  Foreign body sensation in throat    ED Discharge Orders     None          Lenor Hollering, MD 07/16/24 1250  "

## 2024-07-16 NOTE — Discharge Instructions (Signed)
 Call to make an appointment to follow-up with the ENT listed above.  Return to the emergency room if you have any worsening symptoms.

## 2024-07-16 NOTE — ED Triage Notes (Signed)
 Arrives POV with complaints of having a small pepcid  pill stuck in her throat. Called her PCP who referred her to the ED. Patient speaking clearly while in triage.

## 2024-07-16 NOTE — Telephone Encounter (Signed)
" ° °  FYI Only or Action Required?: FYI only for provider: ED advised.  Patient was last seen in primary care on 03/23/2024 by Megan Good, Tully GRADE, MD.  Called Nurse Triage reporting Swallowed Foreign Body.  Symptoms began today.  Interventions attempted: Other: tried to swallow water and caused coughing .  Symptoms are: rapidly worsening.  Triage Disposition: Go to ED Now (Notify PCP)  Patient/caregiver understands and will follow disposition?: Yes    Instructed ED now and if sx worsen call 911.             Reason for Disposition  [1] Swallowed a pill or hard candy AND [2] SEVERE symptoms of pill stuck in throat or esophagus (e.g., severe pain, bleeding, or can't swallow liquids)  Answer Assessment - Initial Assessment Questions 1. OBJECT: What is it?      Pill went down windpipe 2. SIZE: How large is it? (e.g., inches or cm; or compare it to coins)      Pill size  3. ONSET: How long ago did you swallow it? (e.g., minutes, hours)      30 minutes  4. HOW DID IT HAPPEN: Tell me how it happened.      Swallowed pill and went down wrong side in throat 5. OTHER SYMPTOMS: Are there any other symptoms? (e.g., pain in neck or chest, difficulty breathing, difficulty swallowing)     No chest pain no difficulty breathing. Does have difficulty swallowing and coughs everytime tries to drink water to get pick down. Talking with hoarse sounding voice. 6. PREGNANCY: Is there any chance you are pregnant? When was your last menstrual period?     na  Protocols used: Swallowed Foreign Body-A-AH  "
